# Patient Record
Sex: Female | Born: 1970 | Race: Black or African American | Hispanic: No | Smoking: Never smoker
Health system: Southern US, Community
[De-identification: ages and names within clinical notes are randomized; demographics above are authoritative.]

## PROBLEM LIST (undated history)

## (undated) DIAGNOSIS — D649 Anemia, unspecified: Secondary | ICD-10-CM

## (undated) DIAGNOSIS — H409 Unspecified glaucoma: Secondary | ICD-10-CM

## (undated) DIAGNOSIS — F32A Depression, unspecified: Secondary | ICD-10-CM

## (undated) DIAGNOSIS — F419 Anxiety disorder, unspecified: Secondary | ICD-10-CM

## (undated) DIAGNOSIS — L509 Urticaria, unspecified: Secondary | ICD-10-CM

## (undated) DIAGNOSIS — Z923 Personal history of irradiation: Secondary | ICD-10-CM

---

## 2003-12-12 ENCOUNTER — Emergency Department (HOSPITAL_COMMUNITY): Admission: EM | Admit: 2003-12-12 | Discharge: 2003-12-12 | Payer: Self-pay | Admitting: Emergency Medicine

## 2008-07-09 ENCOUNTER — Emergency Department (HOSPITAL_COMMUNITY): Admission: EM | Admit: 2008-07-09 | Discharge: 2008-07-09 | Payer: Self-pay | Admitting: Family Medicine

## 2009-03-05 ENCOUNTER — Emergency Department (HOSPITAL_COMMUNITY): Admission: EM | Admit: 2009-03-05 | Discharge: 2009-03-05 | Payer: Self-pay | Admitting: Emergency Medicine

## 2016-01-11 DIAGNOSIS — H40013 Open angle with borderline findings, low risk, bilateral: Secondary | ICD-10-CM | POA: Insufficient documentation

## 2016-01-11 DIAGNOSIS — H2513 Age-related nuclear cataract, bilateral: Secondary | ICD-10-CM | POA: Insufficient documentation

## 2016-01-11 DIAGNOSIS — L309 Dermatitis, unspecified: Secondary | ICD-10-CM | POA: Insufficient documentation

## 2016-01-11 DIAGNOSIS — L501 Idiopathic urticaria: Secondary | ICD-10-CM | POA: Insufficient documentation

## 2020-07-20 ENCOUNTER — Other Ambulatory Visit: Payer: Self-pay

## 2020-07-20 ENCOUNTER — Ambulatory Visit (HOSPITAL_COMMUNITY)
Admission: EM | Admit: 2020-07-20 | Discharge: 2020-07-21 | Disposition: A | Discharge status: Home / Self Care | Payer: 59 | Attending: Behavioral Health | Admitting: Behavioral Health

## 2020-07-20 ENCOUNTER — Ambulatory Visit (HOSPITAL_COMMUNITY)
Admission: AD | Admit: 2020-07-20 | Discharge: 2020-07-20 | Disposition: A | Discharge status: Home / Self Care | Payer: 59 | Source: Home / Self Care | Attending: Psychiatry | Admitting: Psychiatry

## 2020-07-20 DIAGNOSIS — Z20822 Contact with and (suspected) exposure to covid-19: Secondary | ICD-10-CM | POA: Insufficient documentation

## 2020-07-20 DIAGNOSIS — F4310 Post-traumatic stress disorder, unspecified: Secondary | ICD-10-CM | POA: Insufficient documentation

## 2020-07-20 DIAGNOSIS — F411 Generalized anxiety disorder: Secondary | ICD-10-CM | POA: Diagnosis not present

## 2020-07-20 DIAGNOSIS — F332 Major depressive disorder, recurrent severe without psychotic features: Secondary | ICD-10-CM | POA: Insufficient documentation

## 2020-07-20 DIAGNOSIS — R45851 Suicidal ideations: Secondary | ICD-10-CM | POA: Insufficient documentation

## 2020-07-20 DIAGNOSIS — F419 Anxiety disorder, unspecified: Secondary | ICD-10-CM | POA: Insufficient documentation

## 2020-07-20 DIAGNOSIS — Z635 Disruption of family by separation and divorce: Secondary | ICD-10-CM | POA: Insufficient documentation

## 2020-07-20 LAB — POCT URINE DRUG SCREEN - MANUAL ENTRY (I-SCREEN)
POC Amphetamine UR: NOT DETECTED
POC Buprenorphine (BUP): NOT DETECTED
POC Marijuana UR: NOT DETECTED
POC Methamphetamine UR: NOT DETECTED
POC Morphine: NOT DETECTED
POC Secobarbital (BAR): NOT DETECTED

## 2020-07-20 LAB — POCT URINE DRUG SCREEN - MANUAL ENTRY (I-CUP)
POC Cocaine UR: NOT DETECTED
POC Methadone UR: NOT DETECTED
POC Oxazepam (BZO): NOT DETECTED
POC Oxycodone UR: NOT DETECTED

## 2020-07-20 LAB — POCT PREGNANCY, URINE: Preg Test, Ur: NEGATIVE

## 2020-07-20 LAB — POC SARS CORONAVIRUS 2 AG: SARS Coronavirus 2 Ag: NEGATIVE

## 2020-07-20 LAB — POC SARS CORONAVIRUS 2 AG -  ED: SARS Coronavirus 2 Ag: NEGATIVE

## 2020-07-20 MED ORDER — LORAZEPAM 1 MG PO TABS
1.0000 mg | ORAL_TABLET | Freq: Once | ORAL | Status: AC
  Administered 2020-07-20: 1 mg via ORAL
  Filled 2020-07-20: qty 1

## 2020-07-20 MED ORDER — BUSPIRONE HCL 5 MG PO TABS
10.0000 mg | ORAL_TABLET | Freq: Three times a day (TID) | ORAL | Status: DC
  Administered 2020-07-20 – 2020-07-21 (×2): 10 mg via ORAL
  Filled 2020-07-20 (×2): qty 2
  Filled 2020-07-20: qty 1

## 2020-07-20 MED ORDER — ALUM & MAG HYDROXIDE-SIMETH 200-200-20 MG/5ML PO SUSP
30.0000 mL | ORAL | Status: DC | PRN
Start: 2020-07-20 — End: 2020-07-21

## 2020-07-20 MED ORDER — TRAZODONE HCL 50 MG PO TABS: 50.0000 mg | ORAL_TABLET | Freq: Every evening | ORAL | Status: DC | PRN

## 2020-07-20 MED ORDER — CITALOPRAM HYDROBROMIDE 10 MG PO TABS
10.0000 mg | ORAL_TABLET | Freq: Every day | ORAL | Status: DC
  Administered 2020-07-20: 10 mg via ORAL
  Filled 2020-07-20: qty 1

## 2020-07-20 MED ORDER — MAGNESIUM HYDROXIDE 400 MG/5ML PO SUSP: 30.0000 mL | Freq: Every day | ORAL | Status: DC | PRN

## 2020-07-20 MED ORDER — ACETAMINOPHEN 325 MG PO TABS: 650.0000 mg | ORAL_TABLET | Freq: Four times a day (QID) | ORAL | Status: DC | PRN
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
## 2020-07-20 NOTE — ED Notes (Signed)
Patient has no belongings in storage.

## 2020-07-21 ENCOUNTER — Encounter (HOSPITAL_COMMUNITY): Payer: Self-pay | Admitting: Emergency Medicine

## 2020-07-21 ENCOUNTER — Other Ambulatory Visit: Payer: Self-pay

## 2020-07-21 LAB — CBC WITH DIFFERENTIAL/PLATELET
Abs Immature Granulocytes: 0.02 10*3/uL (ref 0.00–0.07)
Basophils Absolute: 0.1 10*3/uL (ref 0.0–0.1)
Basophils Relative: 1 %
Eosinophils Absolute: 0 10*3/uL (ref 0.0–0.5)
Eosinophils Relative: 0 %
HCT: 36 % (ref 36.0–46.0)
Hemoglobin: 10.3 g/dL — ABNORMAL LOW (ref 12.0–15.0)
Immature Granulocytes: 0 %
Lymphocytes Relative: 28 %
Lymphs Abs: 2.2 10*3/uL (ref 0.7–4.0)
MCH: 22.1 pg — ABNORMAL LOW (ref 26.0–34.0)
MCHC: 28.6 g/dL — ABNORMAL LOW (ref 30.0–36.0)
MCV: 77.3 fL — ABNORMAL LOW (ref 80.0–100.0)
Monocytes Absolute: 0.6 10*3/uL (ref 0.1–1.0)
Monocytes Relative: 7 %
Neutro Abs: 5.1 10*3/uL (ref 1.7–7.7)
Neutrophils Relative %: 64 %
Platelets: 517 10*3/uL — ABNORMAL HIGH (ref 150–400)
RBC: 4.66 MIL/uL (ref 3.87–5.11)
RDW: 21.4 % — ABNORMAL HIGH (ref 11.5–15.5)
WBC: 8 10*3/uL (ref 4.0–10.5)
nRBC: 0 % (ref 0.0–0.2)

## 2020-07-21 LAB — ETHANOL: Alcohol, Ethyl (B): <10 mg/dL (ref <10)

## 2020-07-21 LAB — COMPREHENSIVE METABOLIC PANEL
ALT: 10 U/L (ref 0–44)
AST: 14 U/L — ABNORMAL LOW (ref 15–41)
Albumin: 3.8 g/dL (ref 3.5–5.0)
Alkaline Phosphatase: 46 U/L (ref 38–126)
Anion gap: 14 (ref 5–15)
BUN: 13 mg/dL (ref 6–20)
CO2: 24 mmol/L (ref 22–32)
Calcium: 9.7 mg/dL (ref 8.9–10.3)
Chloride: 102 mmol/L (ref 98–111)
Creatinine, Ser: 0.73 mg/dL (ref 0.44–1.00)
GFR calc Af Amer: >60 mL/min (ref >60)
GFR calc non Af Amer: >60 mL/min (ref >60)
Glucose, Bld: 103 mg/dL — ABNORMAL HIGH (ref 70–99)
Potassium: 2.9 mmol/L — ABNORMAL LOW (ref 3.5–5.1)
Sodium: 140 mmol/L (ref 135–145)
Total Bilirubin: 0.7 mg/dL (ref 0.3–1.2)
Total Protein: 8.3 g/dL — ABNORMAL HIGH (ref 6.5–8.1)

## 2020-07-21 LAB — SARS CORONAVIRUS 2 BY RT PCR (HOSPITAL ORDER, PERFORMED IN ~~LOC~~ HOSPITAL LAB): SARS Coronavirus 2: NEGATIVE

## 2020-07-21 LAB — HEMOGLOBIN A1C
Hgb A1c MFr Bld: 5.6 % (ref 4.8–5.6)
Mean Plasma Glucose: 114.02 mg/dL

## 2020-07-21 LAB — LIPID PANEL
Cholesterol: 193 mg/dL (ref 0–200)
HDL: 52 mg/dL (ref >40)
LDL Cholesterol: 129 mg/dL — ABNORMAL HIGH (ref 0–99)
Total CHOL/HDL Ratio: 3.7 RATIO
Triglycerides: 60 mg/dL (ref <150)
VLDL: 12 mg/dL (ref 0–40)

## 2020-07-21 LAB — TSH: TSH: 0.582 u[IU]/mL (ref 0.350–4.500)

## 2020-07-21 LAB — MAGNESIUM: Magnesium: 2.1 mg/dL (ref 1.7–2.4)

## 2020-07-21 MED ORDER — BUSPIRONE HCL 10 MG PO TABS
10.0000 mg | ORAL_TABLET | Freq: Three times a day (TID) | ORAL | 0 refills | Status: DC
Start: 2020-07-21 — End: 2020-08-08

## 2020-07-21 MED ORDER — POTASSIUM CHLORIDE CRYS ER 20 MEQ PO TBCR
40.0000 meq | EXTENDED_RELEASE_TABLET | Freq: Once | ORAL | Status: AC
  Administered 2020-07-21: 40 meq via ORAL
  Filled 2020-07-21: qty 2

## 2020-07-21 MED ORDER — CITALOPRAM HYDROBROMIDE 10 MG PO TABS
10.0000 mg | ORAL_TABLET | Freq: Every day | ORAL | 0 refills | Status: DC
Start: 2020-07-21 — End: 2020-08-08
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
## 2020-07-21 NOTE — ED Notes (Signed)
Pt sleeping at present, no distress noted, monitoring for safety. 

## 2020-07-21 NOTE — ED Notes (Signed)
Pt sitting up in chair wrapped in blanket. Watching tv. No new issues noted at this time

## 2020-07-21 NOTE — Discharge Instructions (Addendum)

## 2020-07-21 NOTE — ED Notes (Addendum)
Pt alert and oriented on the unit. Pt is very teary eyed and anxious when engaged with RN. Pt is sitting in her chair/bed watching television. Education, support and encouragement provided. Medications administered per Provider's orders. No reactions/side effects to medicine noted. Pt denies any concerns at this time, and verbally contracts for safety. Pt ambulating on the unit with no issues. Pt remains safe on the unit.

## 2020-07-21 NOTE — ED Triage Notes (Signed)
Presents with SI and anxiety increasing in severity over past 24 hrs.

## 2020-07-21 NOTE — ED Notes (Signed)
Pt alert and oriented on the unit. Education, support, and encouragement provided. Discharge summary, medications and follow up appointments reviewed with pt. Suicide prevention resources provided. Pt had no belongings in locker. Pt denies SI/HI, A/VH, pain, or any concerns at this time. Pt ambulatory on and off unit. Pt discharged to lobby.
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
## 2020-07-22 LAB — PROLACTIN: Prolactin: 14.1 ng/mL (ref 4.8–23.3)

## 2020-08-01 ENCOUNTER — Other Ambulatory Visit: Payer: Self-pay

## 2020-08-01 ENCOUNTER — Encounter (HOSPITAL_COMMUNITY): Payer: Self-pay | Admitting: Psychiatry

## 2020-08-01 ENCOUNTER — Ambulatory Visit (INDEPENDENT_AMBULATORY_CARE_PROVIDER_SITE_OTHER): Payer: 59 | Admitting: Psychiatry

## 2020-08-01 DIAGNOSIS — F4310 Post-traumatic stress disorder, unspecified: Secondary | ICD-10-CM

## 2020-08-01 DIAGNOSIS — F331 Major depressive disorder, recurrent, moderate: Secondary | ICD-10-CM | POA: Diagnosis not present
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
## 2020-08-08 ENCOUNTER — Other Ambulatory Visit: Payer: Self-pay

## 2020-08-08 ENCOUNTER — Telehealth (INDEPENDENT_AMBULATORY_CARE_PROVIDER_SITE_OTHER): Payer: 59 | Admitting: Psychiatry

## 2020-08-08 ENCOUNTER — Encounter (HOSPITAL_COMMUNITY): Payer: Self-pay | Admitting: Psychiatry

## 2020-08-08 DIAGNOSIS — F4310 Post-traumatic stress disorder, unspecified: Secondary | ICD-10-CM | POA: Diagnosis not present

## 2020-08-08 DIAGNOSIS — F331 Major depressive disorder, recurrent, moderate: Secondary | ICD-10-CM

## 2020-08-08 MED ORDER — HYDROXYZINE HCL 25 MG PO TABS: 25.0000 mg | ORAL_TABLET | Freq: Every day | ORAL | 1 refills | Status: DC | PRN

## 2020-08-08 MED ORDER — CITALOPRAM HYDROBROMIDE 20 MG PO TABS
20.0000 mg | ORAL_TABLET | Freq: Every day | ORAL | 1 refills | Status: DC
Start: 2020-08-08 — End: 2020-09-12

## 2020-08-08 NOTE — Patient Instructions (Addendum)
1. Increase citalopram 20 mg daily  2. Discontinue buspar  3. Start hydroxyzine 25 mg daily as needed for anxiety  4. Next appointment- 10/21 at 9 AM  CONTACT INFORMATION  What to do if you need to get in touch with someone regarding a psychiatric issue:  1. EMERGENCY: For psychiatric emergencies (if you are suicidal or if there are any other safety issues) call 911 and/or go to your nearest Emergency Room immediately.   2. IF YOU NEED SOMEONE TO TALK TO RIGHT NOW: Given my clinical responsibilities, I may not be able to speak with you over the phone for a prolonged period of time.  A. You may always call The National Suicide Prevention Lifeline at 1-800-273-TALK [phone]).  B. You may walk in to [facility]  Address: [address] [place], [place] [address], Phone: [phone].  Open 24/7, No appointment required. [place] of residence will also have local crisis services. For [facility]: [place] at [phone] ([place])

## 2020-08-14 HISTORY — PX: HYSTEROSCOPY W/ ENDOMETRIAL ABLATION: SUR665

## 2020-08-20 ENCOUNTER — Ambulatory Visit (INDEPENDENT_AMBULATORY_CARE_PROVIDER_SITE_OTHER): Payer: Managed Care, Other (non HMO) | Admitting: Psychiatry

## 2020-08-20 ENCOUNTER — Other Ambulatory Visit: Payer: Self-pay

## 2020-08-20 DIAGNOSIS — F4310 Post-traumatic stress disorder, unspecified: Secondary | ICD-10-CM | POA: Diagnosis not present

## 2020-08-20 DIAGNOSIS — F331 Major depressive disorder, recurrent, moderate: Secondary | ICD-10-CM | POA: Diagnosis not present
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
## 2020-09-03 ENCOUNTER — Ambulatory Visit (INDEPENDENT_AMBULATORY_CARE_PROVIDER_SITE_OTHER): Payer: 59 | Admitting: Psychiatry

## 2020-09-03 ENCOUNTER — Other Ambulatory Visit: Payer: Self-pay

## 2020-09-03 DIAGNOSIS — F331 Major depressive disorder, recurrent, moderate: Secondary | ICD-10-CM
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
## 2020-09-12 ENCOUNTER — Encounter (HOSPITAL_COMMUNITY): Payer: Self-pay | Admitting: Psychiatry

## 2020-09-12 ENCOUNTER — Telehealth (INDEPENDENT_AMBULATORY_CARE_PROVIDER_SITE_OTHER): Payer: 59 | Admitting: Psychiatry

## 2020-09-12 ENCOUNTER — Other Ambulatory Visit: Payer: Self-pay

## 2020-09-12 DIAGNOSIS — F4310 Post-traumatic stress disorder, unspecified: Secondary | ICD-10-CM

## 2020-09-12 DIAGNOSIS — F331 Major depressive disorder, recurrent, moderate: Secondary | ICD-10-CM

## 2020-09-12 MED ORDER — CITALOPRAM HYDROBROMIDE 40 MG PO TABS: 40.0000 mg | ORAL_TABLET | Freq: Every day | ORAL | 1 refills | Status: DC

## 2020-09-12 MED ORDER — HYDROXYZINE HCL 25 MG PO TABS: 25.0000 mg | ORAL_TABLET | Freq: Every day | ORAL | 0 refills | Status: DC | PRN

## 2020-09-12 NOTE — Patient Instructions (Signed)
1. Increase citalopram 40 mg daily  2. Continue hydroxyzine 25 mg daily as needed for anxiety  4. Next appointment- 12/9 at 2:30  CONTACT INFORMATION  What to do if you need to get in touch with someone regarding a psychiatric issue:  1. EMERGENCY: For psychiatric emergencies (if you are suicidal or if there are any other safety issues) call 911 and/or go to your nearest Emergency Room immediately.   2. IF YOU NEED SOMEONE TO TALK TO RIGHT NOW: Given my clinical responsibilities, I may not be able to speak with you over the phone for a prolonged period of time.  A. You may always call The National Suicide Prevention Lifeline at 1-800-273-TALK 629-199-9664).  B. You may walk in to [facility]  Address: [address] [place], [place] [address], Phone: [phone].  Open 24/7, No appointment required. [place] of residence will also have local crisis services. For [facility]: [place] at [phone] ([place])

## 2020-09-17 ENCOUNTER — Ambulatory Visit (HOSPITAL_COMMUNITY): Payer: 59 | Admitting: Psychiatry

## 2020-10-01 ENCOUNTER — Telehealth: Payer: Self-pay

## 2020-10-01 ENCOUNTER — Other Ambulatory Visit: Payer: Self-pay

## 2020-10-01 ENCOUNTER — Ambulatory Visit (INDEPENDENT_AMBULATORY_CARE_PROVIDER_SITE_OTHER): Payer: 59 | Admitting: Psychiatry

## 2020-10-01 DIAGNOSIS — F4310 Post-traumatic stress disorder, unspecified: Secondary | ICD-10-CM

## 2020-10-01 DIAGNOSIS — F331 Major depressive disorder, recurrent, moderate: Secondary | ICD-10-CM | POA: Diagnosis not present

## 2020-10-01 NOTE — Telephone Encounter (Signed)
received a fax requesting a 90 day supply of the hydroxyzine. pt next appt 10-31-20   hydrOXYzine (ATARAX/VISTARIL) 25 MG tablet Medication Date: 09/12/2020 Department: [facility] Ordering/Authorizing: [name], MD  Order Providers  Prescribing Provider Encounter Provider  [name], MD [name], MD  Outpatient Medication Detail   Disp Refills Start End   hydrOXYzine (ATARAX/VISTARIL) 25 MG tablet 30 tablet 0 10/06/2020    Sig - Route: Take 1 tablet (25 mg total) by mouth daily as needed for anxiety. - Oral   Sent to pharmacy as: hydrOXYzine (ATARAX/VISTARIL) 25 MG tablet   E-Prescribing Status: Receipt confirmed by pharmacy (09/12/2020 9:21 AM EDT)   Pharmacy  [id number] - [place], [place]

## 2020-10-01 NOTE — Telephone Encounter (Signed)
Decline as we may change the dose.
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
## 2020-10-23 ENCOUNTER — Ambulatory Visit (INDEPENDENT_AMBULATORY_CARE_PROVIDER_SITE_OTHER): Payer: 59 | Admitting: Psychiatry

## 2020-10-23 ENCOUNTER — Other Ambulatory Visit: Payer: Self-pay

## 2020-10-23 DIAGNOSIS — F331 Major depressive disorder, recurrent, moderate: Secondary | ICD-10-CM

## 2020-10-23 DIAGNOSIS — F4310 Post-traumatic stress disorder, unspecified: Secondary | ICD-10-CM | POA: Diagnosis not present
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
## 2020-10-31 ENCOUNTER — Encounter: Payer: Self-pay | Admitting: Psychiatry

## 2020-10-31 ENCOUNTER — Telehealth (INDEPENDENT_AMBULATORY_CARE_PROVIDER_SITE_OTHER): Payer: 59 | Admitting: Psychiatry

## 2020-10-31 ENCOUNTER — Telehealth (HOSPITAL_COMMUNITY): Payer: 59 | Admitting: Psychiatry

## 2020-10-31 ENCOUNTER — Other Ambulatory Visit: Payer: Self-pay

## 2020-10-31 DIAGNOSIS — F331 Major depressive disorder, recurrent, moderate: Secondary | ICD-10-CM | POA: Diagnosis not present

## 2020-10-31 DIAGNOSIS — F4310 Post-traumatic stress disorder, unspecified: Secondary | ICD-10-CM | POA: Diagnosis not present

## 2020-10-31 MED ORDER — HYDROXYZINE HCL 25 MG PO TABS: 25.0000 mg | ORAL_TABLET | Freq: Every day | ORAL | 1 refills | Status: DC | PRN

## 2020-10-31 MED ORDER — SERTRALINE HCL 50 MG PO TABS: ORAL_TABLET | ORAL | 1 refills | Status: DC

## 2020-11-06 ENCOUNTER — Ambulatory Visit (HOSPITAL_COMMUNITY): Payer: 59 | Admitting: Psychiatry

## 2020-11-07 ENCOUNTER — Telehealth: Payer: Self-pay

## 2020-11-07 NOTE — Telephone Encounter (Signed)
Decline as this medication is supposed to be discontinued

## 2020-11-07 NOTE — Telephone Encounter (Signed)
Medication refill request - Fax received from [facility] for a 90 day refill of patient's prescribed Citalopram, last ordered 09/12/20 + 1 refill.  Pt returns 12/12/20 and last seen 10/31/20.

## 2020-11-11 ENCOUNTER — Ambulatory Visit (INDEPENDENT_AMBULATORY_CARE_PROVIDER_SITE_OTHER): Payer: 59 | Admitting: Psychiatry

## 2020-11-11 ENCOUNTER — Other Ambulatory Visit: Payer: Self-pay

## 2020-11-11 DIAGNOSIS — F331 Major depressive disorder, recurrent, moderate: Secondary | ICD-10-CM

## 2020-11-11 DIAGNOSIS — F4310 Post-traumatic stress disorder, unspecified: Secondary | ICD-10-CM | POA: Diagnosis not present
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
## 2020-11-20 ENCOUNTER — Ambulatory Visit (HOSPITAL_COMMUNITY): Payer: 59 | Admitting: Psychiatry

## 2020-12-02 ENCOUNTER — Ambulatory Visit (HOSPITAL_COMMUNITY): Payer: 59 | Admitting: Psychiatry

## 2020-12-12 ENCOUNTER — Telehealth: Payer: 59 | Admitting: Psychiatry

## 2020-12-16 ENCOUNTER — Other Ambulatory Visit: Payer: Self-pay

## 2020-12-16 ENCOUNTER — Ambulatory Visit (INDEPENDENT_AMBULATORY_CARE_PROVIDER_SITE_OTHER): Payer: 59 | Admitting: Psychiatry

## 2020-12-16 DIAGNOSIS — F331 Major depressive disorder, recurrent, moderate: Secondary | ICD-10-CM

## 2020-12-16 DIAGNOSIS — F4310 Post-traumatic stress disorder, unspecified: Secondary | ICD-10-CM
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
## 2021-01-02 ENCOUNTER — Other Ambulatory Visit: Payer: Self-pay

## 2021-01-02 ENCOUNTER — Ambulatory Visit (INDEPENDENT_AMBULATORY_CARE_PROVIDER_SITE_OTHER): Payer: 59 | Admitting: Psychiatry

## 2021-01-02 DIAGNOSIS — F331 Major depressive disorder, recurrent, moderate: Secondary | ICD-10-CM

## 2021-01-02 DIAGNOSIS — F4310 Post-traumatic stress disorder, unspecified: Secondary | ICD-10-CM | POA: Diagnosis not present
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
## 2021-01-03 ENCOUNTER — Encounter: Payer: Self-pay | Admitting: Psychiatry

## 2021-01-03 ENCOUNTER — Telehealth (INDEPENDENT_AMBULATORY_CARE_PROVIDER_SITE_OTHER): Payer: 59 | Admitting: Psychiatry

## 2021-01-03 ENCOUNTER — Other Ambulatory Visit: Payer: Self-pay

## 2021-01-03 DIAGNOSIS — F330 Major depressive disorder, recurrent, mild: Secondary | ICD-10-CM | POA: Diagnosis not present

## 2021-01-03 DIAGNOSIS — F4310 Post-traumatic stress disorder, unspecified: Secondary | ICD-10-CM

## 2021-01-03 MED ORDER — SERTRALINE HCL 100 MG PO TABS: 100.0000 mg | ORAL_TABLET | Freq: Every day | ORAL | 0 refills | Status: DC

## 2021-01-03 MED ORDER — HYDROXYZINE HCL 25 MG PO TABS: 25.0000 mg | ORAL_TABLET | Freq: Every day | ORAL | 0 refills | Status: DC | PRN

## 2021-01-16 ENCOUNTER — Ambulatory Visit (HOSPITAL_COMMUNITY): Payer: 59 | Admitting: Psychiatry
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
## 2021-01-21 ENCOUNTER — Encounter (HOSPITAL_BASED_OUTPATIENT_CLINIC_OR_DEPARTMENT_OTHER): Payer: Self-pay | Admitting: General Surgery

## 2021-01-22 ENCOUNTER — Other Ambulatory Visit: Payer: Self-pay

## 2021-01-22 ENCOUNTER — Encounter (HOSPITAL_BASED_OUTPATIENT_CLINIC_OR_DEPARTMENT_OTHER): Payer: Self-pay | Admitting: General Surgery

## 2021-01-23 ENCOUNTER — Other Ambulatory Visit: Payer: Self-pay | Admitting: General Surgery

## 2021-01-24 ENCOUNTER — Other Ambulatory Visit (HOSPITAL_COMMUNITY)
Admission: RE | Admit: 2021-01-24 | Discharge: 2021-01-24 | Disposition: A | Discharge status: Home / Self Care | Payer: 59 | Source: Ambulatory Visit | Attending: General Surgery | Admitting: General Surgery

## 2021-01-24 DIAGNOSIS — Z20822 Contact with and (suspected) exposure to covid-19: Secondary | ICD-10-CM | POA: Diagnosis not present

## 2021-01-24 DIAGNOSIS — Z01812 Encounter for preprocedural laboratory examination: Secondary | ICD-10-CM | POA: Insufficient documentation

## 2021-01-24 LAB — SARS CORONAVIRUS 2 (TAT 6-24 HRS): SARS Coronavirus 2: NEGATIVE

## 2021-01-24 MED ORDER — ENSURE PRE-SURGERY PO LIQD: 296.0000 mL | Freq: Once | ORAL | Status: DC

## 2021-01-24 NOTE — Progress Notes (Signed)

## 2021-01-28 ENCOUNTER — Other Ambulatory Visit: Payer: Self-pay

## 2021-01-28 ENCOUNTER — Encounter (HOSPITAL_BASED_OUTPATIENT_CLINIC_OR_DEPARTMENT_OTHER)
Admission: RE | Disposition: A | Discharge status: Home / Self Care | Payer: Self-pay | Source: Ambulatory Visit | Attending: General Surgery

## 2021-01-28 ENCOUNTER — Ambulatory Visit (HOSPITAL_BASED_OUTPATIENT_CLINIC_OR_DEPARTMENT_OTHER)
Admission: RE | Admit: 2021-01-28 | Discharge: 2021-01-28 | Disposition: A | Discharge status: Home / Self Care | Payer: 59 | Source: Ambulatory Visit | Attending: General Surgery | Admitting: General Surgery

## 2021-01-28 ENCOUNTER — Ambulatory Visit (HOSPITAL_BASED_OUTPATIENT_CLINIC_OR_DEPARTMENT_OTHER): Payer: 59 | Admitting: Anesthesiology

## 2021-01-28 ENCOUNTER — Encounter (HOSPITAL_BASED_OUTPATIENT_CLINIC_OR_DEPARTMENT_OTHER): Payer: Self-pay | Admitting: General Surgery

## 2021-01-28 DIAGNOSIS — Z887 Allergy status to serum and vaccine status: Secondary | ICD-10-CM | POA: Diagnosis not present

## 2021-01-28 DIAGNOSIS — N6002 Solitary cyst of left breast: Secondary | ICD-10-CM | POA: Diagnosis not present

## 2021-01-28 DIAGNOSIS — Z880 Allergy status to penicillin: Secondary | ICD-10-CM | POA: Insufficient documentation

## 2021-01-28 HISTORY — DX: Unspecified glaucoma: H40.9

## 2021-01-28 HISTORY — PX: BREAST CYST EXCISION: SHX579

## 2021-01-28 HISTORY — DX: Depression, unspecified: F32.A

## 2021-01-28 HISTORY — DX: Anxiety disorder, unspecified: F41.9

## 2021-01-28 HISTORY — DX: Anemia, unspecified: D64.9

## 2021-01-28 LAB — POCT PREGNANCY, URINE: Preg Test, Ur: NEGATIVE

## 2021-01-28 SURGERY — EXCISION, CYST, BREAST
Anesthesia: General | Site: Breast | Laterality: Left

## 2021-01-28 MED ORDER — CEFAZOLIN SODIUM-DEXTROSE 2-4 GM/100ML-% IV SOLN
INTRAVENOUS | Status: AC
  Filled 2021-01-28: qty 100

## 2021-01-28 MED ORDER — CELECOXIB 200 MG PO CAPS
400.0000 mg | ORAL_CAPSULE | ORAL | Status: AC
  Administered 2021-01-28: 400 mg via ORAL

## 2021-01-28 MED ORDER — BUPIVACAINE HCL (PF) 0.25 % IJ SOLN
INTRAMUSCULAR | Status: AC
  Filled 2021-01-28: qty 30

## 2021-01-28 MED ORDER — CHLORHEXIDINE GLUCONATE CLOTH 2 % EX PADS: 6.0000 | MEDICATED_PAD | Freq: Once | CUTANEOUS | Status: DC

## 2021-01-28 MED ORDER — LIDOCAINE HCL (CARDIAC) PF 100 MG/5ML IV SOSY
PREFILLED_SYRINGE | INTRAVENOUS | Status: DC | PRN
  Administered 2021-01-28: 60 mg via INTRAVENOUS

## 2021-01-28 MED ORDER — ACETAMINOPHEN 500 MG PO TABS
ORAL_TABLET | ORAL | Status: AC
  Filled 2021-01-28: qty 2

## 2021-01-28 MED ORDER — CELECOXIB 200 MG PO CAPS
ORAL_CAPSULE | ORAL | Status: AC
  Filled 2021-01-28: qty 2

## 2021-01-28 MED ORDER — ONDANSETRON HCL 4 MG/2ML IJ SOLN
INTRAMUSCULAR | Status: DC | PRN
  Administered 2021-01-28: 4 mg via INTRAVENOUS

## 2021-01-28 MED ORDER — LACTATED RINGERS IV SOLN: INTRAVENOUS | Status: DC

## 2021-01-28 MED ORDER — BUPIVACAINE HCL (PF) 0.25 % IJ SOLN
INTRAMUSCULAR | Status: DC | PRN
  Administered 2021-01-28: 10 mL

## 2021-01-28 MED ORDER — PROPOFOL 10 MG/ML IV BOLUS
INTRAVENOUS | Status: AC
  Filled 2021-01-28: qty 20

## 2021-01-28 MED ORDER — FENTANYL CITRATE (PF) 100 MCG/2ML IJ SOLN
INTRAMUSCULAR | Status: AC
  Filled 2021-01-28: qty 2

## 2021-01-28 MED ORDER — DEXAMETHASONE SODIUM PHOSPHATE 4 MG/ML IJ SOLN
INTRAMUSCULAR | Status: DC | PRN
  Administered 2021-01-28: 4 mg via INTRAVENOUS

## 2021-01-28 MED ORDER — PROPOFOL 10 MG/ML IV BOLUS
INTRAVENOUS | Status: DC | PRN
  Administered 2021-01-28: 150 mg via INTRAVENOUS

## 2021-01-28 MED ORDER — EPHEDRINE SULFATE 50 MG/ML IJ SOLN
INTRAMUSCULAR | Status: DC | PRN
  Administered 2021-01-28 (×2): 10 mg via INTRAVENOUS

## 2021-01-28 MED ORDER — OXYCODONE HCL 5 MG PO TABS
5.0000 mg | ORAL_TABLET | Freq: Once | ORAL | Status: AC | PRN
Start: 2021-01-28 — End: 2021-01-28
  Administered 2021-01-28: 5 mg via ORAL

## 2021-01-28 MED ORDER — CEFAZOLIN SODIUM-DEXTROSE 2-4 GM/100ML-% IV SOLN
2.0000 g | INTRAVENOUS | Status: AC
  Administered 2021-01-28: 2 g via INTRAVENOUS

## 2021-01-28 MED ORDER — LIDOCAINE 2% (20 MG/ML) 5 ML SYRINGE
INTRAMUSCULAR | Status: AC
  Filled 2021-01-28: qty 5

## 2021-01-28 MED ORDER — FENTANYL CITRATE (PF) 100 MCG/2ML IJ SOLN
INTRAMUSCULAR | Status: DC | PRN
  Administered 2021-01-28: 50 ug via INTRAVENOUS
  Administered 2021-01-28 (×2): 25 ug via INTRAVENOUS

## 2021-01-28 MED ORDER — PHENYLEPHRINE HCL (PRESSORS) 10 MG/ML IV SOLN
INTRAVENOUS | Status: DC | PRN
  Administered 2021-01-28 (×2): 80 ug via INTRAVENOUS

## 2021-01-28 MED ORDER — FENTANYL CITRATE (PF) 100 MCG/2ML IJ SOLN
25.0000 ug | INTRAMUSCULAR | Status: DC | PRN
  Administered 2021-01-28: 25 ug via INTRAVENOUS
  Administered 2021-01-28: 50 ug via INTRAVENOUS

## 2021-01-28 MED ORDER — OXYCODONE HCL 5 MG PO TABS
ORAL_TABLET | ORAL | Status: AC
  Filled 2021-01-28: qty 1

## 2021-01-28 MED ORDER — MIDAZOLAM HCL 2 MG/2ML IJ SOLN
INTRAMUSCULAR | Status: AC
  Filled 2021-01-28: qty 2

## 2021-01-28 MED ORDER — ACETAMINOPHEN 500 MG PO TABS
1000.0000 mg | ORAL_TABLET | ORAL | Status: AC
  Administered 2021-01-28: 1000 mg via ORAL

## 2021-01-28 MED ORDER — MIDAZOLAM HCL 5 MG/5ML IJ SOLN
INTRAMUSCULAR | Status: DC | PRN
  Administered 2021-01-28: 2 mg via INTRAVENOUS

## 2021-01-28 SURGICAL SUPPLY — 51 items
ADH SKN CLS APL DERMABOND .7 (GAUZE/BANDAGES/DRESSINGS)
APL PRP STRL LF DISP 70% ISPRP (MISCELLANEOUS) ×1
BINDER BREAST LRG (GAUZE/BANDAGES/DRESSINGS) IMPLANT
BINDER BREAST MEDIUM (GAUZE/BANDAGES/DRESSINGS) IMPLANT
BINDER BREAST XLRG (GAUZE/BANDAGES/DRESSINGS) ×2 IMPLANT
BINDER BREAST XXLRG (GAUZE/BANDAGES/DRESSINGS) IMPLANT
BLADE SURG 15 STRL LF DISP TIS (BLADE) ×1 IMPLANT
BLADE SURG 15 STRL SS (BLADE) ×2
CANISTER SUCT 1200ML W/VALVE (MISCELLANEOUS) IMPLANT
CHLORAPREP W/TINT 26 (MISCELLANEOUS) ×2 IMPLANT
CLIP VESOCCLUDE SM WIDE 6/CT (CLIP) IMPLANT
COVER BACK TABLE 60X90IN (DRAPES) ×2 IMPLANT
COVER MAYO STAND STRL (DRAPES) ×2 IMPLANT
COVER WAND RF STERILE (DRAPES) IMPLANT
DECANTER SPIKE VIAL GLASS SM (MISCELLANEOUS) IMPLANT
DERMABOND ADVANCED (GAUZE/BANDAGES/DRESSINGS)
DERMABOND ADVANCED .7 DNX12 (GAUZE/BANDAGES/DRESSINGS) IMPLANT
DRAPE LAPAROSCOPIC ABDOMINAL (DRAPES) ×2 IMPLANT
DRAPE UTILITY XL STRL (DRAPES) ×2 IMPLANT
DRSG TEGADERM 4X4.75 (GAUZE/BANDAGES/DRESSINGS) ×2 IMPLANT
ELECT COATED BLADE 2.86 ST (ELECTRODE) ×2 IMPLANT
ELECT REM PT RETURN 9FT ADLT (ELECTROSURGICAL) ×2
ELECTRODE REM PT RTRN 9FT ADLT (ELECTROSURGICAL) ×1 IMPLANT
GAUZE SPONGE 4X4 12PLY STRL LF (GAUZE/BANDAGES/DRESSINGS) ×2 IMPLANT
GLOVE SURG ENC MOIS LTX SZ7 (GLOVE) ×2 IMPLANT
GLOVE SURG UNDER POLY LF SZ7.5 (GLOVE) ×2 IMPLANT
GOWN STRL REUS W/ TWL LRG LVL3 (GOWN DISPOSABLE) ×3 IMPLANT
GOWN STRL REUS W/TWL LRG LVL3 (GOWN DISPOSABLE) ×6
KIT MARKER MARGIN INK (KITS) ×2 IMPLANT
NEEDLE HYPO 25X1 1.5 SAFETY (NEEDLE) ×2 IMPLANT
NS IRRIG 1000ML POUR BTL (IV SOLUTION) IMPLANT
PACK BASIN DAY SURGERY FS (CUSTOM PROCEDURE TRAY) ×2 IMPLANT
PENCIL SMOKE EVACUATOR (MISCELLANEOUS) ×2 IMPLANT
RETRACTOR ONETRAX LX 90X20 (MISCELLANEOUS) IMPLANT
SLEEVE SCD COMPRESS KNEE MED (STOCKING) ×2 IMPLANT
SPONGE LAP 4X18 RFD (DISPOSABLE) ×4 IMPLANT
STRIP CLOSURE SKIN 1/2X4 (GAUZE/BANDAGES/DRESSINGS) ×2 IMPLANT
SUT MNCRL AB 4-0 PS2 18 (SUTURE) IMPLANT
SUT MON AB 5-0 PS2 18 (SUTURE) IMPLANT
SUT SILK 2 0 SH (SUTURE) ×2 IMPLANT
SUT VIC AB 2-0 SH 27 (SUTURE) ×4
SUT VIC AB 2-0 SH 27XBRD (SUTURE) ×2 IMPLANT
SUT VIC AB 3-0 SH 27 (SUTURE) ×2
SUT VIC AB 3-0 SH 27X BRD (SUTURE) ×1 IMPLANT
SUT VIC AB 5-0 PS2 18 (SUTURE) ×2 IMPLANT
SUT VICRYL AB 3 0 TIES (SUTURE) IMPLANT
SYR CONTROL 10ML LL (SYRINGE) ×2 IMPLANT
TOWEL GREEN STERILE FF (TOWEL DISPOSABLE) ×2 IMPLANT
TRAY FAXITRON CT DISP (TRAY / TRAY PROCEDURE) IMPLANT
TUBE CONNECTING 20X1/4 (TUBING) IMPLANT
YANKAUER SUCT BULB TIP NO VENT (SUCTIONS) IMPLANT

## 2021-01-28 NOTE — H&P (Signed)
Maria Diaz is an 50 y.o. female.   Chief Complaint: recurrent breast cyst HPI: 45 yof with prior history of breast cyst and aspiration presents with luoq and axillary pain. she has no dc. I do not have any of these records today and am unable to get them on care everywhere. there apparently was a left breast cyst aspirated in 2020 and then in Feb 2021. she notes pain where this might have been but no real mass. she hasnt had any more imaging since then. she has no bca family history. I now have her records. she has also had another mm/us since then. she continues to palpate this mass and have pain at the site. she thinks the cysts have been drained three times and recurred each time. her latest imaging shows c density breasts. there are several oval masses in the uoq of the left breast that measure 2.9 cm in total. there are numerous other cysts on both sides but only ones that bother her are the luoq. she is here to discuss options.   Past Medical History:  Diagnosis Date  . Anemia   . Anxiety   . Depression   . Glaucoma     Past Surgical History:  Procedure Laterality Date  . HYSTEROSCOPY W/ ENDOMETRIAL ABLATION  08/14/2020   [facility]    Family History  Problem Relation Age of Onset  . Anxiety disorder Sister   . Drug abuse Mother    Social History:  reports that she has never smoked. She has never used smokeless tobacco. She reports current alcohol use of about 1.0 standard drink of alcohol per week. She reports that she does not use drugs.  Allergies:  Allergies  Allergen Reactions  . Haemophilus Influenzae Vaccines Hives  . Penicillins Hives    No medications prior to admission.    No results found for this or any previous visit (from the past 48 hour(s)). No results found.  Review of Systems  All other systems reviewed and are negative.   Height 5\' 7"  (1.702 m), weight 74.8 kg, last menstrual period 06/23/2020. Physical Exam  General Mental  Status-Alert. Orientation-Oriented X3. Breast Nipples-No Discharge. Note: hard mass in luoq, mobile tender about 3-4 cm Lymphatic Head & Neck General Head & Neck Lymphatics: Bilateral - Description - Normal. Axillary General Axillary Region: Bilateral - Description - Normal. Note: no [place] adenopathy   Assessment/Plan BREAST CYST, LEFT (N60.02) Story: left breast mass excision I am concerned somewhat due to recurrence and the exam findings but they do just appear to be cysts on [place]. she is quite symptomatic at this site and I think reasonable to excise these at this point and send to path. more drainage is not really going to help. she would like to do that option also. discussed surgery and recovery. will do with US guidance.  Rolm Bookbinder, MD 01/28/2021, 10:02 AM

## 2021-01-28 NOTE — Anesthesia Preprocedure Evaluation (Addendum)
Anesthesia Evaluation  Patient identified by MRN, date of birth, ID band Patient awake    Reviewed: Allergy & Precautions, NPO status , Patient's Chart, lab work & pertinent test results  Airway Mallampati: II  TM Distance: >3 FB Neck ROM: Full    Dental no notable dental hx. (+) Teeth Intact, Dental Advisory Given   Pulmonary neg pulmonary ROS,    Pulmonary exam normal breath sounds clear to auscultation       Cardiovascular negative cardio ROS Normal cardiovascular exam Rhythm:Regular Rate:Normal     Neuro/Psych PSYCHIATRIC DISORDERS Anxiety Depression negative neurological ROS     GI/Hepatic negative GI ROS, Neg liver ROS,   Endo/Other  negative endocrine ROS  Renal/GU negative Renal ROS  negative genitourinary   Musculoskeletal negative musculoskeletal ROS (+)   Abdominal   Peds  Hematology negative hematology ROS (+)   Anesthesia Other Findings Left breast cyst  Reproductive/Obstetrics                            Anesthesia Physical Anesthesia Plan  ASA: II  Anesthesia Plan: General   Post-op Pain Management:    Induction: Intravenous  PONV Risk Score and Plan: 3 and Ondansetron, Dexamethasone and Midazolam  Airway Management Planned: LMA  Additional Equipment:   Intra-op Plan:   Post-operative Plan: Extubation in OR  Informed Consent: I have reviewed the patients History and Physical, chart, labs and discussed the procedure including the risks, benefits and alternatives for the proposed anesthesia with the patient or authorized representative who has indicated his/her understanding and acceptance.     Dental advisory given  Plan Discussed with: CRNA  Anesthesia Plan Comments:         Anesthesia Quick Evaluation

## 2021-01-28 NOTE — Transfer of Care (Signed)
Immediate Anesthesia Transfer of Care Note  Patient: Maria Diaz  Procedure(s) Performed: LEFT BREAST MASS EXCISION (Left Breast)  Patient Location: PACU  Anesthesia Type:General  Level of Consciousness: awake, alert  and oriented  Airway & Oxygen Therapy: Patient Spontanous Breathing and Patient connected to face mask oxygen  Post-op Assessment: Report given to RN and Post -op Vital signs reviewed and stable  Post vital signs: Reviewed and stable  Last Vitals:  Vitals Value Taken Time  BP 135/87 01/28/21 1511  Temp    Pulse 92 01/28/21 1513  Resp 15 01/28/21 1513  SpO2 100 % 01/28/21 1513  Vitals shown include unvalidated device data.  Last Pain:  Vitals:   01/28/21 1320  TempSrc: Oral  PainSc: 4       Patients Stated Pain Goal: 4 (67/01/41 0301)  Complications: No complications documented.

## 2021-01-28 NOTE — Interval H&P Note (Signed)
History and Physical Interval Note:  01/28/2021 1:13 PM  Maria Diaz  has presented today for surgery, with the diagnosis of LEFT BREAST RECURRENT INFLAMMATORY CYST.  The various methods of treatment have been discussed with the patient and family. After consideration of risks, benefits and other options for treatment, the patient has consented to  Procedure(s) with comments: LEFT BREAST MASS EXCISION (Left) - START TIME OF 3:00 PM FOR 60 MINUTES IN ROOM 8 as a surgical intervention.  The patient's history has been reviewed, patient examined, no change in status, stable for surgery.  I have reviewed the patient's chart and labs.  Questions were answered to the patient's satisfaction.     Rolm Bookbinder

## 2021-01-28 NOTE — Anesthesia Procedure Notes (Signed)
Procedure Name: LMA Insertion Date/Time: 01/28/2021 2:17 PM Performed by: Maryella Shivers, CRNA Pre-anesthesia Checklist: Patient identified, Emergency Drugs available, Suction available and Patient being monitored Patient Re-evaluated:Patient Re-evaluated prior to induction Oxygen Delivery Method: Circle system utilized Preoxygenation: Pre-oxygenation with 100% oxygen Induction Type: IV induction Ventilation: Mask ventilation without difficulty LMA: LMA inserted LMA Size: 4.0 Number of attempts: 1 Airway Equipment and Method: Bite block Placement Confirmation: positive ETCO2 Tube secured with: Tape Dental Injury: Teeth and Oropharynx as per pre-operative assessment

## 2021-01-28 NOTE — Discharge Instructions (Signed)
[facility] Phone Number [phone]  BREAST BIOPSY/ PARTIAL MASTECTOMY: POST OP INSTRUCTIONS Take 400 mg of ibuprofen every 8 hours or 650 mg tylenol every 6 hours for next 72 hours then as needed. Use ice several times daily also. Always review your discharge instruction sheet given to you by the facility where your surgery was performed.  IF YOU HAVE DISABILITY OR FAMILY LEAVE FORMS, YOU MUST BRING THEM TO THE OFFICE FOR PROCESSING.  DO NOT GIVE THEM TO YOUR DOCTOR.  1. A prescription for pain medication may be given to you upon discharge.  Take your pain medication as prescribed, if needed.  If narcotic pain medicine is not needed, then you may take acetaminophen (Tylenol), naprosyn (Alleve) or ibuprofen (Advil) as needed. No Tylenol or Ibuprofen until 7:30 if needed. 2. Take your usually prescribed medications unless otherwise directed 3. If you need a refill on your pain medication, please contact your pharmacy.  They will contact our office to request authorization.  Prescriptions will not be filled after 5pm or on week-ends. 4. You should eat very light the first 24 hours after surgery, such as soup, crackers, pudding, etc.  Resume your normal diet the day after surgery. 5. Most patients will experience some swelling and bruising in the breast.  Ice packs and a good support bra will help.  Wear the breast binder provided or a sports bra for 72 hours day and night.  After that wear a sports bra during the day until you return to the office. Swelling and bruising can take several days to resolve.  6. It is common to experience some constipation if taking pain medication after surgery.  Increasing fluid intake and taking a stool softener will usually help or prevent this problem from occurring.  A mild laxative (Milk of Magnesia or Miralax) should be taken according to package directions if there are no bowel movements after 48 hours. 7. Unless discharge instructions  indicate otherwise, you may remove your bandages 48 hours after surgery and you may shower at that time.  You may have steri-strips (small skin tapes) in place directly over the incision.  These strips should be left on the skin for 7-10 days and will come off on their own.  If your surgeon used skin glue on the incision, you may shower in 24 hours.  The glue will flake off over the next 2-3 weeks.  Any sutures or staples will be removed at the office during your follow-up visit. 8. ACTIVITIES:  You may resume regular daily activities (gradually increasing) beginning the next day.  Wearing a good support bra or sports bra minimizes pain and swelling.  You may have sexual intercourse when it is comfortable. a. You may drive when you no longer are taking prescription pain medication, you can comfortably wear a seatbelt, and you can safely maneuver your car and apply brakes. b. RETURN TO WORK:  ______________________________________________________________________________________ 9. You should see your doctor in the office for a follow-up appointment approximately two weeks after your surgery.  Your doctor's nurse will typically make your follow-up appointment when she calls you with your pathology report.  Expect your pathology report 3-4 business days after your surgery.  You may call to check if you do not hear from [place] after three days. 10. OTHER INSTRUCTIONS: _______________________________________________________________________________________________ _____________________________________________________________________________________________________________________________________ _____________________________________________________________________________________________________________________________________ _____________________________________________________________________________________________________________________________________  WHEN TO CALL DR WAKEFIELD: 1. Fever over 101.0 2. Nausea  and/or vomiting. 3. Extreme swelling or bruising. 4. Continued bleeding from incision. 5. Increased pain, redness,  or drainage from the incision.  The clinic staff is available to answer your questions during regular business hours.  Please don't hesitate to call and ask to speak to one of the nurses for clinical concerns.  If you have a medical emergency, go to the nearest emergency room or call 911.  A surgeon from [facility] is always on call at the hospital.  For further questions, please visit [email or web address]   Post Anesthesia Home Care Instructions  Activity: Get plenty of rest for the remainder of the day. A responsible individual must stay with you for 24 hours following the procedure.  For the next 24 hours, DO NOT: -Drive a car -Paediatric nurse -Drink alcoholic beverages -Take any medication unless instructed by your physician -Make any legal decisions or sign important papers.  Meals: Start with liquid foods such as gelatin or soup. Progress to regular foods as tolerated. Avoid greasy, spicy, heavy foods. If nausea and/or vomiting occur, drink only clear liquids until the nausea and/or vomiting subsides. Call your physician if vomiting continues.  Special Instructions/Symptoms: Your throat may feel dry or sore from the anesthesia or the breathing tube placed in your throat during surgery. If this causes discomfort, gargle with warm salt water. The discomfort should disappear within 24 hours.  If you had a scopolamine patch placed behind your ear for the management of post- operative nausea and/or vomiting:  1. The medication in the patch is effective for 72 hours, after which it should be removed.  Wrap patch in a tissue and discard in the trash. Wash hands thoroughly with soap and water. 2. You may remove the patch earlier than 72 hours if you experience unpleasant side effects which may include dry mouth, dizziness or visual disturbances. 3.  Avoid touching the patch. Wash your hands with soap and water after contact with the patch.

## 2021-01-28 NOTE — Op Note (Signed)
Preoperative diagnosis: Recurrent symptomatic left breast cyst Postoperative diagnosis: Same as above Procedure: Right breast cyst excisional biopsy Surgeon: Dr. Serita Grammes Anesthesia: General Estimated blood loss: Minimal Complications: None Drains: None Specimens: Left breast cysts marked with paint Special count was correct completion Disposition recovery stable condition  Indications:49 yof with prior history of breast cyst and aspiration presents with luoq and axillary pain. she has no dc.  there apparently was a left breast cyst aspirated in 2020 and then in Feb 2021.  she has also had another mm/us since then. she continues to palpate this mass and have pain at the site. she thinks the cysts have been drained three times and recurred each time.her latest imaging shows c density breasts. there are several oval masses in the uoq of the left breast that measure 2.9 cm in total. there are numerous other cysts on both sides but only ones that bother her are the luoq. she is here to discuss options.   Procedure: After informed consent was obtained and the patient I had both marked the cyst, she was taken to the operating room.  She was given antibiotics.  SCDs were in place.  She was placed under general anesthesia without complication.  She was prepped and draped in the standard sterile surgical fashion.  A surgical timeout was then performed.  Identified the cyst.  I did aspirate one of them to ensure the correct position.  I then infiltrated Marcaine and made a periareolar incision in order to hide the scar later.  I tunneled to the cyst.  There appeared to be several large cyst in very dense breast tissue.  I again confirmed these were the cystic structures but with aspirating clear yellow fluid.  I then remove the cyst in total.  She has a lot of dense tissue in that area but there was no other cystic tissue noted.  I then obtained hemostasis.  I marked the cyst and the excised  tissue with paint and passed this off the table.  I then closed the breast tissue with 2-0 Vicryl.  The skin was closed with 3-0 Vicryl and 5-0 Monocryl.  Glue and Steri-Strips were applied.  She tolerated this well was extubated and transferred to recovery stable.

## 2021-01-29 NOTE — Anesthesia Postprocedure Evaluation (Signed)
Anesthesia Post Note  Patient: Mahli Glahn Defeo  Procedure(s) Performed: LEFT BREAST MASS EXCISION (Left Breast)     Patient location during evaluation: PACU Anesthesia Type: General Level of consciousness: awake and alert Pain management: pain level controlled Vital Signs Assessment: post-procedure vital signs reviewed and stable Respiratory status: spontaneous breathing, nonlabored ventilation, respiratory function stable and patient connected to nasal cannula oxygen Cardiovascular status: blood pressure returned to baseline and stable Postop Assessment: no apparent nausea or vomiting Anesthetic complications: no   No complications documented.  Last Vitals:  Vitals:   01/28/21 1555 01/28/21 1611  BP:  (!) 146/70  Pulse: 77 76  Resp:    Temp:  (!) 36.3 C  SpO2: 97% 99%    Last Pain:  Vitals:   01/28/21 1611  TempSrc:   PainSc: 5                  Aydrian Halpin L Henok Heacock

## 2021-01-30 ENCOUNTER — Ambulatory Visit (INDEPENDENT_AMBULATORY_CARE_PROVIDER_SITE_OTHER): Payer: 59 | Admitting: Psychiatry

## 2021-01-30 ENCOUNTER — Encounter (HOSPITAL_BASED_OUTPATIENT_CLINIC_OR_DEPARTMENT_OTHER): Payer: Self-pay | Admitting: General Surgery

## 2021-01-30 ENCOUNTER — Other Ambulatory Visit: Payer: Self-pay

## 2021-01-30 ENCOUNTER — Encounter (HOSPITAL_COMMUNITY): Payer: Self-pay

## 2021-01-30 DIAGNOSIS — F4310 Post-traumatic stress disorder, unspecified: Secondary | ICD-10-CM | POA: Diagnosis not present

## 2021-01-30 DIAGNOSIS — F330 Major depressive disorder, recurrent, mild: Secondary | ICD-10-CM

## 2021-01-30 LAB — SURGICAL PATHOLOGY
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
## 2021-02-03 ENCOUNTER — Telehealth (INDEPENDENT_AMBULATORY_CARE_PROVIDER_SITE_OTHER): Payer: 59 | Admitting: Psychiatry

## 2021-02-03 ENCOUNTER — Encounter: Payer: Self-pay | Admitting: Psychiatry

## 2021-02-03 ENCOUNTER — Other Ambulatory Visit: Payer: Self-pay

## 2021-02-03 DIAGNOSIS — F330 Major depressive disorder, recurrent, mild: Secondary | ICD-10-CM | POA: Diagnosis not present

## 2021-02-03 DIAGNOSIS — F4310 Post-traumatic stress disorder, unspecified: Secondary | ICD-10-CM | POA: Diagnosis not present

## 2021-02-13 ENCOUNTER — Other Ambulatory Visit: Payer: Self-pay

## 2021-02-13 ENCOUNTER — Ambulatory Visit (INDEPENDENT_AMBULATORY_CARE_PROVIDER_SITE_OTHER): Payer: 59 | Admitting: Psychiatry

## 2021-02-13 DIAGNOSIS — F4310 Post-traumatic stress disorder, unspecified: Secondary | ICD-10-CM | POA: Diagnosis not present

## 2021-02-13 DIAGNOSIS — F330 Major depressive disorder, recurrent, mild: Secondary | ICD-10-CM
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
## 2021-02-27 ENCOUNTER — Ambulatory Visit (INDEPENDENT_AMBULATORY_CARE_PROVIDER_SITE_OTHER): Payer: 59 | Admitting: Psychiatry

## 2021-02-27 ENCOUNTER — Other Ambulatory Visit: Payer: Self-pay

## 2021-02-27 DIAGNOSIS — F4310 Post-traumatic stress disorder, unspecified: Secondary | ICD-10-CM

## 2021-02-27 DIAGNOSIS — F330 Major depressive disorder, recurrent, mild: Secondary | ICD-10-CM | POA: Diagnosis not present
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
## 2021-03-06 ENCOUNTER — Encounter (HOSPITAL_COMMUNITY): Payer: Self-pay
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
## 2021-03-13 ENCOUNTER — Other Ambulatory Visit: Payer: Self-pay

## 2021-03-13 ENCOUNTER — Ambulatory Visit (INDEPENDENT_AMBULATORY_CARE_PROVIDER_SITE_OTHER): Payer: 59 | Admitting: Psychiatry

## 2021-03-13 DIAGNOSIS — F330 Major depressive disorder, recurrent, mild: Secondary | ICD-10-CM | POA: Diagnosis not present

## 2021-03-13 DIAGNOSIS — F4310 Post-traumatic stress disorder, unspecified: Secondary | ICD-10-CM
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
## 2021-03-18 ENCOUNTER — Other Ambulatory Visit: Payer: Self-pay

## 2021-03-18 ENCOUNTER — Encounter: Payer: Self-pay | Admitting: Psychiatry

## 2021-03-18 ENCOUNTER — Telehealth (INDEPENDENT_AMBULATORY_CARE_PROVIDER_SITE_OTHER): Payer: 59 | Admitting: Psychiatry

## 2021-03-18 DIAGNOSIS — F330 Major depressive disorder, recurrent, mild: Secondary | ICD-10-CM | POA: Diagnosis not present

## 2021-03-18 DIAGNOSIS — F4310 Post-traumatic stress disorder, unspecified: Secondary | ICD-10-CM | POA: Diagnosis not present

## 2021-03-18 MED ORDER — HYDROXYZINE HCL 25 MG PO TABS: 25.0000 mg | ORAL_TABLET | Freq: Every day | ORAL | 0 refills | Status: DC | PRN

## 2021-03-18 MED ORDER — SERTRALINE HCL 100 MG PO TABS: 150.0000 mg | ORAL_TABLET | Freq: Every day | ORAL | 0 refills | Status: DC

## 2021-03-18 NOTE — Patient Instructions (Signed)
1.Increase sertraline 150 mg daily 2. Continuehydroxyzine 25 mg daily as needed for anxiety 3. Next appointment-5/27 at 10 AM

## 2021-03-27 ENCOUNTER — Other Ambulatory Visit: Payer: Self-pay

## 2021-03-27 ENCOUNTER — Ambulatory Visit (HOSPITAL_COMMUNITY): Payer: 59 | Admitting: Psychiatry

## 2021-04-10 ENCOUNTER — Other Ambulatory Visit: Payer: Self-pay

## 2021-04-10 ENCOUNTER — Ambulatory Visit (INDEPENDENT_AMBULATORY_CARE_PROVIDER_SITE_OTHER): Payer: 59 | Admitting: Psychiatry

## 2021-04-10 DIAGNOSIS — F4310 Post-traumatic stress disorder, unspecified: Secondary | ICD-10-CM | POA: Diagnosis not present

## 2021-04-10 DIAGNOSIS — F330 Major depressive disorder, recurrent, mild: Secondary | ICD-10-CM | POA: Diagnosis not present
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
## 2021-04-18 ENCOUNTER — Encounter: Payer: Self-pay | Admitting: Psychiatry

## 2021-04-18 ENCOUNTER — Other Ambulatory Visit: Payer: Self-pay

## 2021-04-18 ENCOUNTER — Telehealth (INDEPENDENT_AMBULATORY_CARE_PROVIDER_SITE_OTHER): Payer: 59 | Admitting: Psychiatry

## 2021-04-18 DIAGNOSIS — F4310 Post-traumatic stress disorder, unspecified: Secondary | ICD-10-CM

## 2021-04-18 DIAGNOSIS — F330 Major depressive disorder, recurrent, mild: Secondary | ICD-10-CM | POA: Diagnosis not present

## 2021-04-18 NOTE — Patient Instructions (Signed)
1.Continue sertraline 150 mg daily 2. Continuehydroxyzine 25 mg daily as needed for anxiety 3. Next appointment-7/22 at 10:20 AM

## 2021-04-28 ENCOUNTER — Ambulatory Visit (INDEPENDENT_AMBULATORY_CARE_PROVIDER_SITE_OTHER): Payer: 59 | Admitting: Psychiatry

## 2021-04-28 ENCOUNTER — Other Ambulatory Visit: Payer: Self-pay

## 2021-04-28 DIAGNOSIS — F330 Major depressive disorder, recurrent, mild: Secondary | ICD-10-CM | POA: Diagnosis not present

## 2021-04-28 DIAGNOSIS — F4310 Post-traumatic stress disorder, unspecified: Secondary | ICD-10-CM | POA: Diagnosis not present
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
## 2021-05-12 ENCOUNTER — Ambulatory Visit (INDEPENDENT_AMBULATORY_CARE_PROVIDER_SITE_OTHER): Payer: 59 | Admitting: Psychiatry

## 2021-05-12 ENCOUNTER — Other Ambulatory Visit: Payer: Self-pay

## 2021-05-12 DIAGNOSIS — F330 Major depressive disorder, recurrent, mild: Secondary | ICD-10-CM | POA: Diagnosis not present
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
## 2021-05-27 ENCOUNTER — Other Ambulatory Visit: Payer: Self-pay

## 2021-05-27 ENCOUNTER — Ambulatory Visit (INDEPENDENT_AMBULATORY_CARE_PROVIDER_SITE_OTHER): Payer: 59 | Admitting: Psychiatry

## 2021-05-27 DIAGNOSIS — F330 Major depressive disorder, recurrent, mild: Secondary | ICD-10-CM | POA: Diagnosis not present
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
## 2021-06-10 ENCOUNTER — Other Ambulatory Visit: Payer: Self-pay

## 2021-06-10 ENCOUNTER — Ambulatory Visit (INDEPENDENT_AMBULATORY_CARE_PROVIDER_SITE_OTHER): Payer: 59 | Admitting: Psychiatry

## 2021-06-10 DIAGNOSIS — F330 Major depressive disorder, recurrent, mild: Secondary | ICD-10-CM
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
## 2021-06-13 ENCOUNTER — Telehealth (INDEPENDENT_AMBULATORY_CARE_PROVIDER_SITE_OTHER): Payer: 59 | Admitting: Psychiatry

## 2021-06-13 ENCOUNTER — Encounter: Payer: Self-pay | Admitting: Psychiatry

## 2021-06-13 DIAGNOSIS — F330 Major depressive disorder, recurrent, mild: Secondary | ICD-10-CM

## 2021-06-13 DIAGNOSIS — F4310 Post-traumatic stress disorder, unspecified: Secondary | ICD-10-CM

## 2021-06-13 MED ORDER — PRAZOSIN HCL 1 MG PO CAPS
1.0000 mg | ORAL_CAPSULE | Freq: Every day | ORAL | 0 refills | Status: DC
Start: 2021-06-13 — End: 2021-09-01

## 2021-06-13 MED ORDER — PRAZOSIN HCL 2 MG PO CAPS: ORAL_CAPSULE | ORAL | 1 refills | Status: DC

## 2021-06-13 MED ORDER — HYDROXYZINE HCL 25 MG PO TABS
25.0000 mg | ORAL_TABLET | Freq: Every day | ORAL | 0 refills | Status: DC | PRN
Start: 2021-07-02 — End: 2021-09-01

## 2021-06-13 MED ORDER — SERTRALINE HCL 100 MG PO TABS: 150.0000 mg | ORAL_TABLET | Freq: Every day | ORAL | 0 refills | Status: DC

## 2021-06-13 NOTE — Patient Instructions (Signed)
1. Continue sertraline 150 mg daily  2. Continue hydroxyzine 25 mg daily as needed for anxiety  3. Start prazosin 1 mg at night for 3 days, then 2 mg at night 3. Next appointment- 9/1 at 11:30

## 2021-07-01 ENCOUNTER — Other Ambulatory Visit: Payer: Self-pay

## 2021-07-01 ENCOUNTER — Ambulatory Visit (INDEPENDENT_AMBULATORY_CARE_PROVIDER_SITE_OTHER): Payer: 59 | Admitting: Psychiatry

## 2021-07-01 DIAGNOSIS — F4310 Post-traumatic stress disorder, unspecified: Secondary | ICD-10-CM

## 2021-07-01 DIAGNOSIS — F330 Major depressive disorder, recurrent, mild: Secondary | ICD-10-CM | POA: Diagnosis not present
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
## 2021-07-15 ENCOUNTER — Other Ambulatory Visit: Payer: Self-pay

## 2021-07-15 ENCOUNTER — Ambulatory Visit (INDEPENDENT_AMBULATORY_CARE_PROVIDER_SITE_OTHER): Payer: 59 | Admitting: Psychiatry

## 2021-07-15 ENCOUNTER — Encounter (HOSPITAL_COMMUNITY): Payer: Self-pay

## 2021-07-15 DIAGNOSIS — F330 Major depressive disorder, recurrent, mild: Secondary | ICD-10-CM | POA: Diagnosis not present

## 2021-07-15 DIAGNOSIS — F4310 Post-traumatic stress disorder, unspecified: Secondary | ICD-10-CM
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
## 2021-07-24 ENCOUNTER — Other Ambulatory Visit: Payer: Self-pay

## 2021-07-24 ENCOUNTER — Telehealth (INDEPENDENT_AMBULATORY_CARE_PROVIDER_SITE_OTHER): Payer: 59 | Admitting: Psychiatry

## 2021-07-24 ENCOUNTER — Encounter: Payer: Self-pay | Admitting: Psychiatry

## 2021-07-24 DIAGNOSIS — F4310 Post-traumatic stress disorder, unspecified: Secondary | ICD-10-CM

## 2021-07-24 DIAGNOSIS — F330 Major depressive disorder, recurrent, mild: Secondary | ICD-10-CM

## 2021-07-24 MED ORDER — PRAZOSIN HCL 2 MG PO CAPS
2.0000 mg | ORAL_CAPSULE | Freq: Every day | ORAL | 0 refills | Status: DC
Start: 2021-08-13 — End: 2021-09-01

## 2021-07-24 NOTE — Patient Instructions (Signed)
1. Continue sertraline 150 mg daily  2. Continue hydroxyzine 25 mg daily as needed for anxiety  3. Continue prazosin 2 mg at night 3. Next appointment- 10/10 at 4:30

## 2021-07-31 ENCOUNTER — Ambulatory Visit (INDEPENDENT_AMBULATORY_CARE_PROVIDER_SITE_OTHER): Payer: 59 | Admitting: Psychiatry

## 2021-07-31 ENCOUNTER — Other Ambulatory Visit: Payer: Self-pay

## 2021-07-31 DIAGNOSIS — F330 Major depressive disorder, recurrent, mild: Secondary | ICD-10-CM | POA: Diagnosis not present
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
## 2021-08-21 ENCOUNTER — Other Ambulatory Visit: Payer: Self-pay

## 2021-08-21 ENCOUNTER — Ambulatory Visit (INDEPENDENT_AMBULATORY_CARE_PROVIDER_SITE_OTHER): Payer: 59 | Admitting: Psychiatry

## 2021-08-21 DIAGNOSIS — F4310 Post-traumatic stress disorder, unspecified: Secondary | ICD-10-CM | POA: Diagnosis not present

## 2021-08-21 DIAGNOSIS — F330 Major depressive disorder, recurrent, mild: Secondary | ICD-10-CM
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
## 2021-09-01 ENCOUNTER — Encounter: Payer: Self-pay | Admitting: Psychiatry

## 2021-09-01 ENCOUNTER — Other Ambulatory Visit: Payer: Self-pay

## 2021-09-01 ENCOUNTER — Telehealth (INDEPENDENT_AMBULATORY_CARE_PROVIDER_SITE_OTHER): Payer: 59 | Admitting: Psychiatry

## 2021-09-01 DIAGNOSIS — F4310 Post-traumatic stress disorder, unspecified: Secondary | ICD-10-CM | POA: Diagnosis not present

## 2021-09-01 DIAGNOSIS — F330 Major depressive disorder, recurrent, mild: Secondary | ICD-10-CM

## 2021-09-01 MED ORDER — SERTRALINE HCL 100 MG PO TABS: 200.0000 mg | ORAL_TABLET | Freq: Every day | ORAL | 0 refills | Status: DC

## 2021-09-01 MED ORDER — HYDROXYZINE HCL 25 MG PO TABS: 25.0000 mg | ORAL_TABLET | Freq: Every day | ORAL | 0 refills | Status: DC | PRN

## 2021-09-01 MED ORDER — PRAZOSIN HCL 2 MG PO CAPS
2.0000 mg | ORAL_CAPSULE | Freq: Every day | ORAL | 0 refills | Status: DC
Start: 2021-11-12 — End: 2022-01-23

## 2021-09-01 NOTE — Patient Instructions (Signed)
1. Continue sertraline 200 mg daily  2. Continue hydroxyzine 25 mg daily as needed for anxiety  3. Continue prazosin 2 mg at night 3. Next appointment- 1/6 at 10 AM,

## 2021-09-09 ENCOUNTER — Other Ambulatory Visit: Payer: Self-pay

## 2021-09-09 ENCOUNTER — Ambulatory Visit (INDEPENDENT_AMBULATORY_CARE_PROVIDER_SITE_OTHER): Payer: 59 | Admitting: Psychiatry

## 2021-09-09 DIAGNOSIS — F330 Major depressive disorder, recurrent, mild: Secondary | ICD-10-CM | POA: Diagnosis not present

## 2021-09-09 DIAGNOSIS — F4310 Post-traumatic stress disorder, unspecified: Secondary | ICD-10-CM | POA: Diagnosis not present
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
## 2021-09-23 ENCOUNTER — Other Ambulatory Visit: Payer: Self-pay

## 2021-09-23 ENCOUNTER — Ambulatory Visit (INDEPENDENT_AMBULATORY_CARE_PROVIDER_SITE_OTHER): Payer: 59 | Admitting: Psychiatry

## 2021-09-23 DIAGNOSIS — F4310 Post-traumatic stress disorder, unspecified: Secondary | ICD-10-CM

## 2021-09-23 DIAGNOSIS — F330 Major depressive disorder, recurrent, mild: Secondary | ICD-10-CM | POA: Diagnosis not present
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
## 2021-10-07 ENCOUNTER — Other Ambulatory Visit: Payer: Self-pay

## 2021-10-07 ENCOUNTER — Ambulatory Visit (INDEPENDENT_AMBULATORY_CARE_PROVIDER_SITE_OTHER): Payer: 59 | Admitting: Psychiatry

## 2021-10-07 DIAGNOSIS — F330 Major depressive disorder, recurrent, mild: Secondary | ICD-10-CM | POA: Diagnosis not present
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
## 2021-10-30 ENCOUNTER — Other Ambulatory Visit: Payer: Self-pay

## 2021-10-30 ENCOUNTER — Ambulatory Visit (INDEPENDENT_AMBULATORY_CARE_PROVIDER_SITE_OTHER): Payer: 59 | Admitting: Psychiatry

## 2021-10-30 DIAGNOSIS — F4310 Post-traumatic stress disorder, unspecified: Secondary | ICD-10-CM | POA: Diagnosis not present

## 2021-10-30 DIAGNOSIS — F330 Major depressive disorder, recurrent, mild: Secondary | ICD-10-CM | POA: Diagnosis not present
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
## 2021-11-13 ENCOUNTER — Other Ambulatory Visit: Payer: Self-pay

## 2021-11-13 ENCOUNTER — Ambulatory Visit (INDEPENDENT_AMBULATORY_CARE_PROVIDER_SITE_OTHER): Payer: 59 | Admitting: Psychiatry

## 2021-11-13 DIAGNOSIS — F4310 Post-traumatic stress disorder, unspecified: Secondary | ICD-10-CM

## 2021-11-13 DIAGNOSIS — F330 Major depressive disorder, recurrent, mild: Secondary | ICD-10-CM
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
## 2021-11-27 ENCOUNTER — Ambulatory Visit (INDEPENDENT_AMBULATORY_CARE_PROVIDER_SITE_OTHER): Payer: 59 | Admitting: Psychiatry

## 2021-11-27 ENCOUNTER — Other Ambulatory Visit: Payer: Self-pay

## 2021-11-27 DIAGNOSIS — F4310 Post-traumatic stress disorder, unspecified: Secondary | ICD-10-CM | POA: Diagnosis not present

## 2021-11-27 DIAGNOSIS — F330 Major depressive disorder, recurrent, mild: Secondary | ICD-10-CM
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
## 2021-11-28 ENCOUNTER — Other Ambulatory Visit: Payer: Self-pay

## 2021-11-28 ENCOUNTER — Telehealth (INDEPENDENT_AMBULATORY_CARE_PROVIDER_SITE_OTHER): Payer: 59 | Admitting: Psychiatry

## 2021-11-28 ENCOUNTER — Encounter: Payer: Self-pay | Admitting: Psychiatry

## 2021-11-28 DIAGNOSIS — F330 Major depressive disorder, recurrent, mild: Secondary | ICD-10-CM

## 2021-11-28 DIAGNOSIS — F4310 Post-traumatic stress disorder, unspecified: Secondary | ICD-10-CM | POA: Diagnosis not present

## 2021-11-28 MED ORDER — SERTRALINE HCL 100 MG PO TABS: 200.0000 mg | ORAL_TABLET | Freq: Every day | ORAL | 1 refills | Status: DC

## 2021-11-28 MED ORDER — HYDROXYZINE HCL 25 MG PO TABS: 25.0000 mg | ORAL_TABLET | Freq: Every day | ORAL | 0 refills | Status: DC | PRN

## 2021-11-28 NOTE — Patient Instructions (Signed)
1. Continue sertraline 200 mg daily  2. Continue hydroxyzine 25 mg daily as needed for anxiety  3. Continue prazosin 2 mg at night 3. Next appointment- 3/3 at 9 AM

## 2021-12-11 ENCOUNTER — Other Ambulatory Visit: Payer: Self-pay

## 2021-12-11 ENCOUNTER — Ambulatory Visit (INDEPENDENT_AMBULATORY_CARE_PROVIDER_SITE_OTHER): Payer: 59 | Admitting: Psychiatry

## 2021-12-11 DIAGNOSIS — F4310 Post-traumatic stress disorder, unspecified: Secondary | ICD-10-CM

## 2021-12-11 DIAGNOSIS — F330 Major depressive disorder, recurrent, mild: Secondary | ICD-10-CM
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
## 2021-12-25 ENCOUNTER — Ambulatory Visit (INDEPENDENT_AMBULATORY_CARE_PROVIDER_SITE_OTHER): Payer: 59 | Admitting: Psychiatry

## 2021-12-25 ENCOUNTER — Other Ambulatory Visit: Payer: Self-pay

## 2021-12-25 DIAGNOSIS — F4310 Post-traumatic stress disorder, unspecified: Secondary | ICD-10-CM

## 2021-12-25 DIAGNOSIS — F330 Major depressive disorder, recurrent, mild: Secondary | ICD-10-CM | POA: Diagnosis not present
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
## 2021-12-26 ENCOUNTER — Telehealth: Payer: Self-pay | Admitting: Hematology and Oncology

## 2021-12-26 NOTE — Telephone Encounter (Signed)
Scheduled appt per 2/3 referral. Pt is aware of appt date and time. Pt is aware to arrive 15 mins prior to appt time.  °

## 2021-12-29 ENCOUNTER — Other Ambulatory Visit: Payer: Self-pay | Admitting: General Surgery

## 2021-12-29 ENCOUNTER — Other Ambulatory Visit (HOSPITAL_COMMUNITY): Payer: Self-pay | Admitting: General Surgery

## 2021-12-29 DIAGNOSIS — C50212 Malignant neoplasm of upper-inner quadrant of left female breast: Secondary | ICD-10-CM

## 2021-12-30 ENCOUNTER — Other Ambulatory Visit: Payer: Self-pay | Admitting: General Surgery

## 2021-12-30 ENCOUNTER — Ambulatory Visit (HOSPITAL_COMMUNITY)
Admission: RE | Admit: 2021-12-30 | Discharge: 2021-12-30 | Disposition: A | Discharge status: Home / Self Care | Payer: 59 | Source: Ambulatory Visit | Attending: General Surgery | Admitting: General Surgery

## 2021-12-30 ENCOUNTER — Other Ambulatory Visit: Payer: Self-pay

## 2021-12-30 DIAGNOSIS — Z170 Estrogen receptor positive status [ER+]: Secondary | ICD-10-CM | POA: Diagnosis present

## 2021-12-30 DIAGNOSIS — C50212 Malignant neoplasm of upper-inner quadrant of left female breast: Secondary | ICD-10-CM | POA: Diagnosis not present

## 2021-12-30 IMAGING — MR MR BREAST BILAT WO/W CM
8 of 11 series · 27 of 48 positions shown · IV contrast (8 ML GADAVIST)
Comparison: Previous exam(s).

CLINICAL DATA: 50-year-old female with newly diagnosed left breast
cancer and left axillary lymph node biopsy.

EXAM:
BILATERAL BREAST MRI WITH AND WITHOUT CONTRAST
TECHNIQUE: Multiplanar, multisequence MR images of both breasts were obtained
prior to and following the intravenous administration of 8 ml of
Gadavist.

[Series 2: T2 · axial · 3.0mm · 0.89mm/px · 1 of 64 slices shown]
[im 1/64]
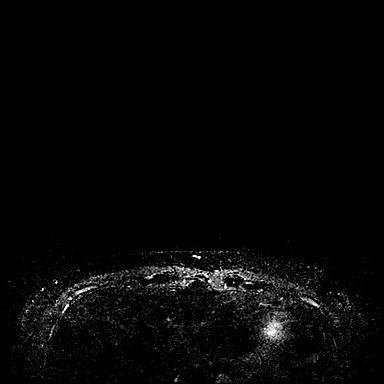

[Series 4: T1 fat-sat · axial · 1.2mm · 0.76mm/px · z∈[-115,+76]mm · 5 of 160 slices shown (1 of 4)]
[im 1/160]
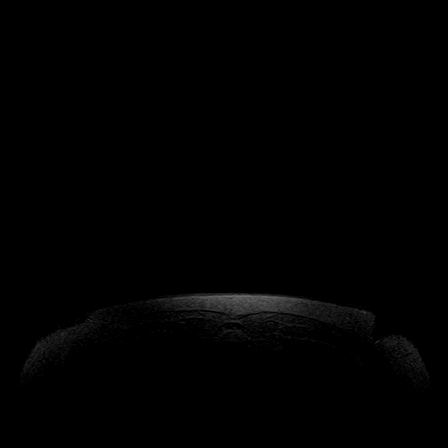
[im 40/160]
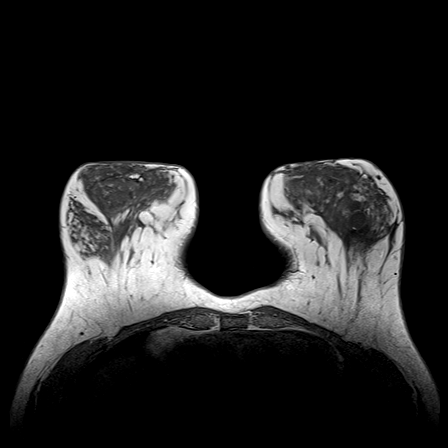
[im 80/160]
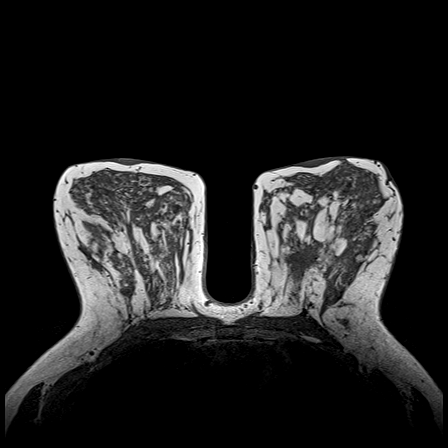
[im 120/160]
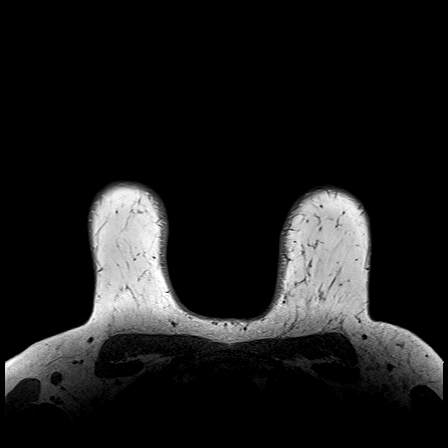
[im 160/160]
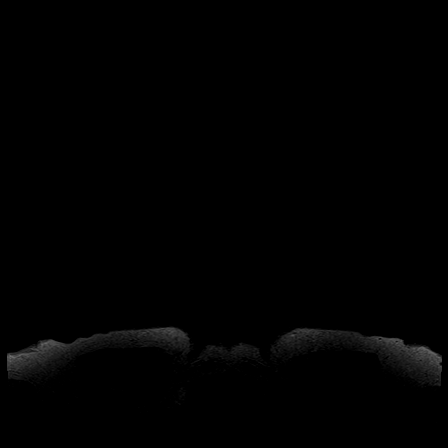

[Series 6: T1 fat-sat · axial · 1.2mm · 0.82mm/px · z∈[-115,+76]mm · 5 of 160 slices shown (2 of 4)]
[im 1/160]
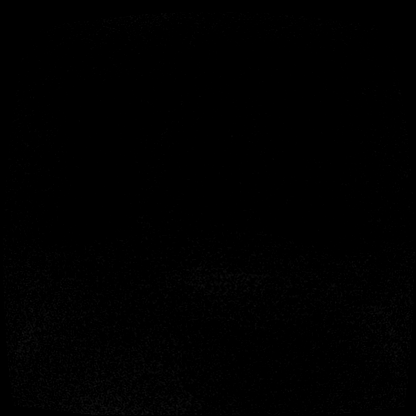
[im 40/160]
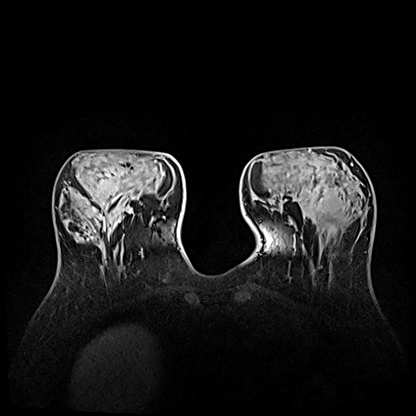
[im 80/160]
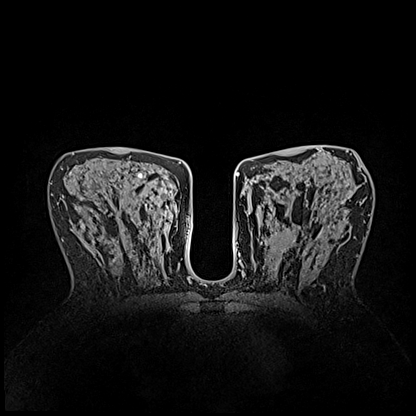
[im 120/160]
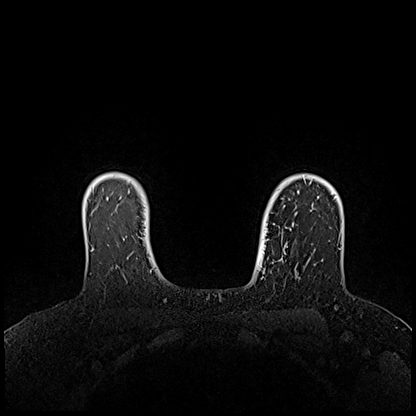
[im 160/160]
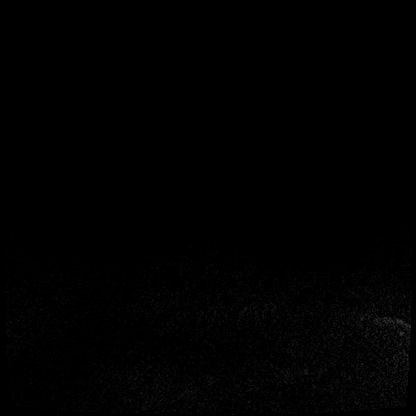

[Series 7: T1 fat-sat · axial · 1.2mm · 0.82mm/px · z∈[-115,+76]mm · 5 of 160 slices shown (3 of 4)]
[im 1/160]
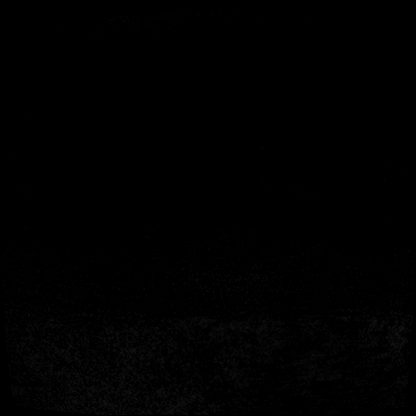
[im 40/160]
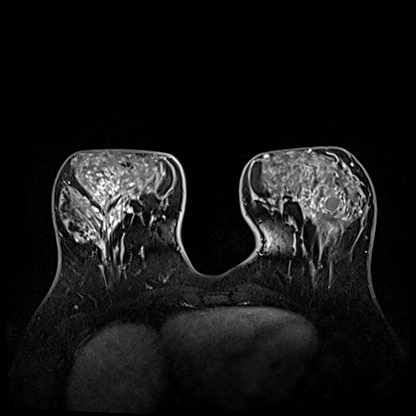
[im 80/160]
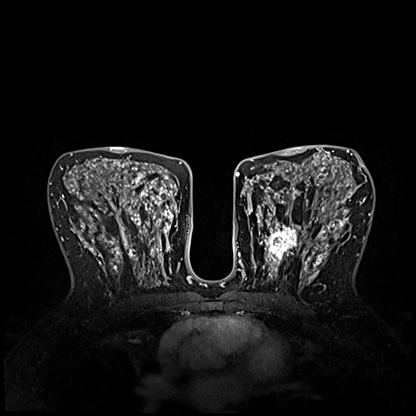
[im 120/160]
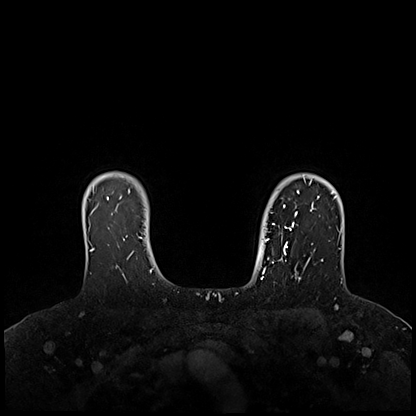
[im 160/160]
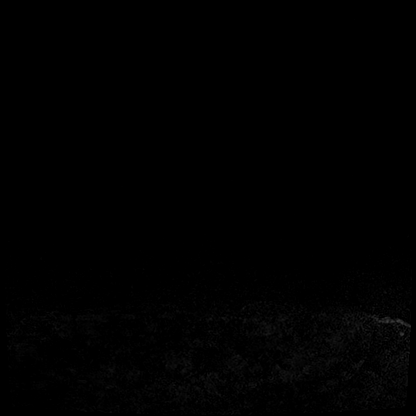

[Series 8: T1 · axial · 1.2mm · 0.82mm/px · z∈[-115,+76]mm · 6 of 160 slices shown (1 of 3)]
[im 1/160]
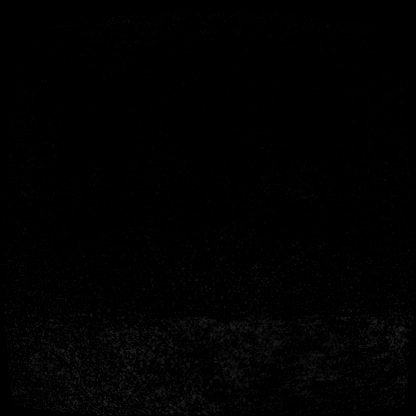
[im 32/160]
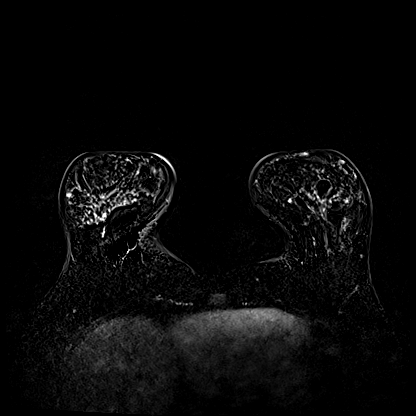
[im 64/160]
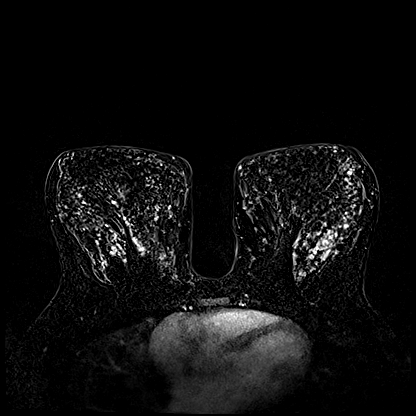
[im 96/160]
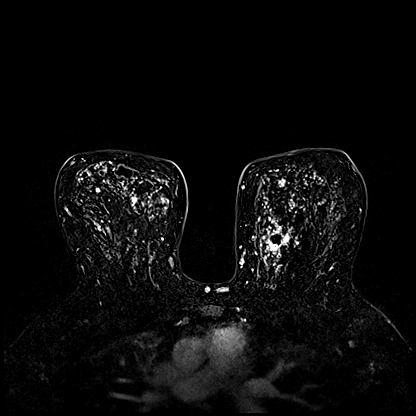
[im 128/160]
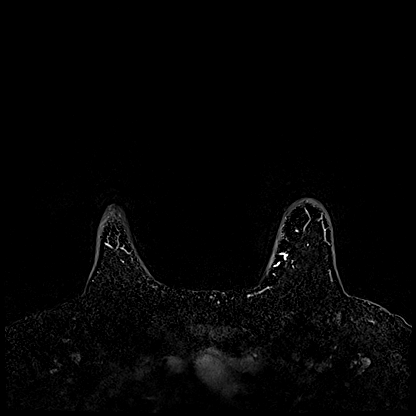
[im 160/160]
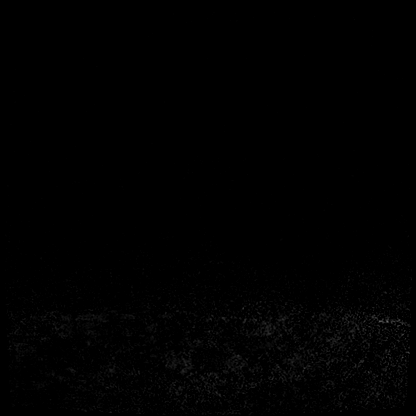

[Series 9: T1 · coronal · 340.0mm · 0.82mm/px · 1 of 3 slices shown (2 of 3)]
[im 1/3]
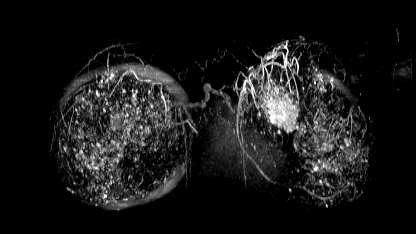

[Series 10: T1 · axial · 192.0mm · 0.82mm/px · 1 of 3 slices shown (3 of 3)]
[im 1/3]
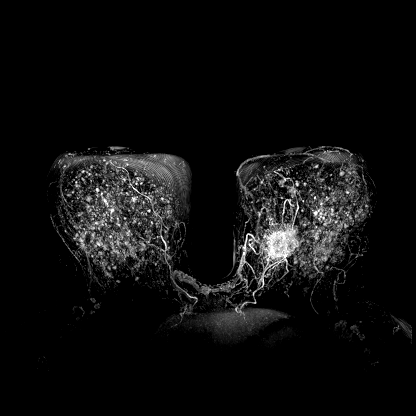

[Series 11: T1 fat-sat · axial · 1.2mm · 0.82mm/px · z∈[-115,-39]mm · 3 of 160 slices shown (4 of 4)]
[im 1/160]
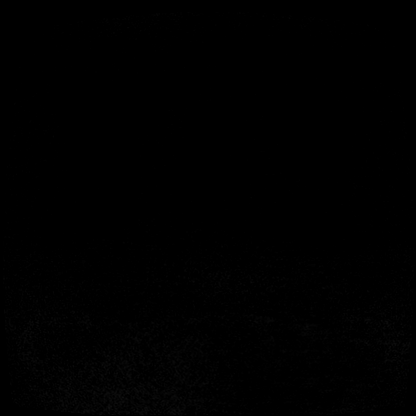
[im 32/160]
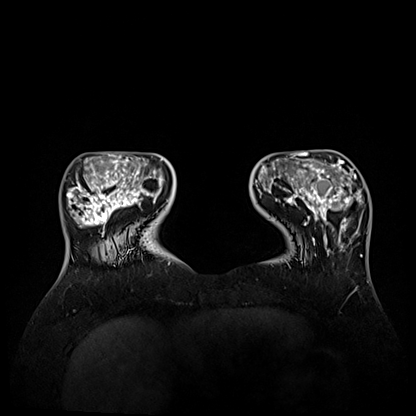
[im 64/160]
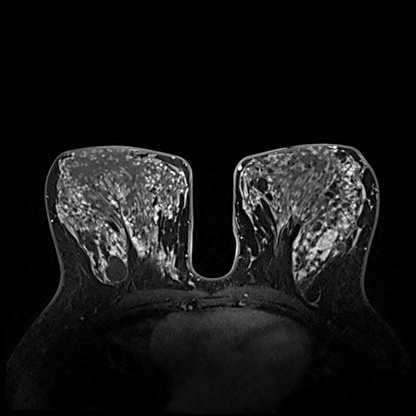

[27 of 48 positions shown; findings below may reference images not displayed]

Three-dimensional MR images were rendered by post-processing of the
original MR data on an independent workstation. The
three-dimensional MR images were interpreted, and findings are
reported in the following complete MRI report for this study. Three
dimensional images were evaluated at the independent interpreting
workstation using the DynaCAD thin client.
FINDINGS: Breast composition: d. Extreme fibroglandular tissue. Note is made
of numerous circumscribed nonenhancing T2 hyperdense cysts scattered
throughout the bilateral breasts.

Background parenchymal enhancement: Marked.

Right breast: No suspicious mass or abnormal enhancement beyond
background parenchymal enhancement.

Left breast: Susceptibility artifact from post biopsy clip is seen
in association with an irregular, spiculated mass with heterogeneous
enhancement in the upper inner quadrant of the left breast
posteriorly (series 9, image 77/160). The mass measures 3.1 x 2.5 x
2.5 cm. This is consistent with the patient's biopsy-proven
malignancy. A similar appearing mass is seen anterior to the index
lesion along the superior central left breast at mid to anterior
depth (series 8, image 79/160). It measures 1.2 x 0.9 x 0.9 cm. This
is concerning for an additional site of malignancy. The overall span
of suspicious findings is greater than 6 cm in the AP dimension.

Lymph nodes: There are numerous (greater than 10 per side) prominent
and morphologically abnormal level 1 and 2 lymph nodes bilaterally.
A level 1 lymph node in the left axilla demonstrates susceptibility
artifact from post biopsy marking clip. There is no suspicious
internal mammary lymphadenopathy.

Ancillary findings:  None.
IMPRESSION: 1. 3.1 cm mass in the upper inner quadrant of the left breast
consistent with the patient's biopsy-proven malignancy.
2. Additional 1.2 cm mass in the upper inner left breast at mid
depth (series 8, image 79), suggestive of an additional site of
disease. The overall span of suspicious findings is greater than 6
cm in the AP dimension.
3. Numerous prominent in morphologically abnormal level 1 and 2
axillary lymph nodes bilaterally. One of these was previously
biopsied with benign results. Given the bilaterality, findings may
represent a lymphoproliferative process.
4. No MRI evidence of malignancy within the right breast.
5. Please note, evaluation is slightly limited due to marked
background parenchymal enhancement.

RECOMMENDATION:
1. Second-look ultrasound of the right axilla for further evaluation
of bilateral lymphadenopathy. Ultrasound-guided biopsy can be
performed at that time and samples should be sent to the lab in both
formalin and saline for evaluation of a lymphoproliferative process.
2. Additional ultrasound evaluation and possible biopsy of left
breast can be performed at the same time. If an ultrasound correlate
cannot be identified for the 1.2 cm mass in the upper inner left
breast, MRI guided biopsy is recommended.

BI-RADS CATEGORY  4: Suspicious.

## 2021-12-30 MED ORDER — GADOBUTROL 1 MMOL/ML IV SOLN
8.0000 mL | Freq: Once | INTRAVENOUS | Status: AC | PRN
  Administered 2021-12-30: 8 mL via INTRAVENOUS

## 2021-12-31 ENCOUNTER — Encounter: Payer: Self-pay | Admitting: Adult Health

## 2021-12-31 ENCOUNTER — Other Ambulatory Visit: Payer: Self-pay

## 2021-12-31 ENCOUNTER — Encounter: Payer: Self-pay | Admitting: Hematology and Oncology

## 2021-12-31 ENCOUNTER — Inpatient Hospital Stay: Payer: 59 | Attending: Hematology and Oncology | Admitting: Hematology and Oncology

## 2021-12-31 VITALS — BP 125/52 | HR 72 | Temp 97.7°F | Resp 16 | Ht 67.0 in | Wt 211.8 lb

## 2021-12-31 DIAGNOSIS — Z5189 Encounter for other specified aftercare: Secondary | ICD-10-CM | POA: Diagnosis not present

## 2021-12-31 DIAGNOSIS — Z170 Estrogen receptor positive status [ER+]: Secondary | ICD-10-CM

## 2021-12-31 DIAGNOSIS — D649 Anemia, unspecified: Secondary | ICD-10-CM | POA: Insufficient documentation

## 2021-12-31 DIAGNOSIS — C50212 Malignant neoplasm of upper-inner quadrant of left female breast: Secondary | ICD-10-CM

## 2021-12-31 DIAGNOSIS — Z5111 Encounter for antineoplastic chemotherapy: Secondary | ICD-10-CM | POA: Insufficient documentation

## 2021-12-31 MED ORDER — PROCHLORPERAZINE MALEATE 10 MG PO TABS: 10.0000 mg | ORAL_TABLET | Freq: Four times a day (QID) | ORAL | 1 refills | Status: DC | PRN

## 2021-12-31 MED ORDER — LIDOCAINE-PRILOCAINE 2.5-2.5 % EX CREA: TOPICAL_CREAM | CUTANEOUS | 3 refills | Status: DC

## 2021-12-31 MED ORDER — LORAZEPAM 0.5 MG PO TABS: 0.5000 mg | ORAL_TABLET | Freq: Four times a day (QID) | ORAL | 0 refills | Status: DC | PRN

## 2021-12-31 MED ORDER — DEXAMETHASONE 4 MG PO TABS: 8.0000 mg | ORAL_TABLET | Freq: Two times a day (BID) | ORAL | 1 refills | Status: DC

## 2021-12-31 MED ORDER — ONDANSETRON HCL 8 MG PO TABS: 8.0000 mg | ORAL_TABLET | Freq: Two times a day (BID) | ORAL | 1 refills | Status: DC | PRN

## 2021-12-31 NOTE — Assessment & Plan Note (Signed)
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  We have discussed about the pathology and prognostics in detail today.  Given tumor size close to 3 cm, node-negative and possible functional triple negative breast cancer, we have discussed about neoadjuvant chemotherapy with TC, docetaxel and cyclophosphamide for 4 cycles.  I discussed about mechanism of action of chemotherapy, adverse effects of chemotherapy including but not limited to fatigue, nausea, vomiting, diarrhea, alopecia, neuropathy, cytopenias and increased risk of infections.  We have discussed about pegfilgrastim of filgrastim for growth factor support to reduce her risk of infections. Post chemotherapy, she will proceed with surgery and adjuvant radiation followed by adjuvant antiestrogen therapy. Given PR negative and weak ER positivity, it is likely that she will have a good response.  She understands that there is no survival benefit of giving chemotherapy neoadjuvantly or adjuvantly, the only benefit could be change in the surgical outcomes in this scenario. She is agreeable to all the recommendations.  Chemo teach will have to be scheduled, chemotherapy anticipate in the next 1 week.  We have discussed about port placement but she is okay to proceed with peripheral IV infusion since it is only 4 treatments. Return to clinic in 7 days to 10 days, prefer MD visit, labs and chemotherapy on the same day.

## 2021-12-31 NOTE — Progress Notes (Signed)
[place] CONSULT NOTE  Patient Care Team: [name], MD as PCP - General (Family Medicine)  CHIEF COMPLAINTS/PURPOSE OF CONSULTATION:  Newly diagnosed breast cancer  HISTORY OF PRESENTING ILLNESS:  Maria Diaz 51 y.o. female is here because of recent diagnosis of left breast cancer  Mammogram, 2.6 x 2 cm irregular mass with a spiculated and indistinct margin in the left breast at 10:00 posterior depth 10 cm from the nipple correlates to the palpated mass, there is architectural distortion associated with the mass, no other suspicious masses or calcifications seen in either breast. Initial pathology could not comment on the grade, prognostics were ER 10% weak, PR negative, Ki 67 25%, HER2 not performed due to block tissue exhausted.  She had a repeat biopsy which showed a grade 3 IDC, prognostics on this 1 were reported verbally as ER 40% weak, PR negative and HER2 negative KI of 30%.  Biopsy from the lymph node did not show any evidence of malignancy.  She is now referred to medical oncology for recommendations.  She is healthy at baseline, denies any past medical history.  She is here with her sister today to discuss recommendations.  She has felt this mass for several months but thought this was another cyst, she had a breast cyst in the past.  She has 3 kids.  Rest of the pertinent 10 point ROS reviewed and negative. I reviewed her records extensively and collaborated the history with the patient.  SUMMARY OF ONCOLOGIC HISTORY: Oncology History  Malignant neoplasm of upper-inner quadrant of left breast in female, estrogen receptor positive ([place])  12/31/2021 Initial Diagnosis   Malignant neoplasm of upper-inner quadrant of left breast in female, estrogen receptor positive ([place])   12/31/2021 Cancer Staging   Staging form: Breast, AJCC 8th Edition - Clinical: cT2, cN0, G3, ER+, PR-, HER2- - Signed by Benay Pike, MD on 12/31/2021 Stage prefix: Initial  diagnosis Histologic grading system: 3 grade system    01/07/2022 -  Chemotherapy   Patient is on Treatment Plan : BREAST TC q21d        MEDICAL HISTORY:  Past Medical History:  Diagnosis Date   Anemia    Anxiety    Depression    Glaucoma     SURGICAL HISTORY: Past Surgical History:  Procedure Laterality Date   BREAST CYST EXCISION Left 01/28/2021   Procedure: LEFT BREAST MASS EXCISION;  Surgeon: Rolm Bookbinder, MD;  Location: [place];  Service: General;  Laterality: Left;  START TIME OF 3:00 PM FOR 60 MINUTES IN ROOM 8   HYSTEROSCOPY W/ ENDOMETRIAL ABLATION  08/14/2020   [facility]    SOCIAL HISTORY: Social History   Socioeconomic History   Marital status: Legally Separated    Spouse name: Not on file   Number of children: Not on file   Years of education: Not on file   Highest education level: Not on file  Occupational History   Not on file  Tobacco Use   Smoking status: Never   Smokeless tobacco: Never  Substance and Sexual Activity   Alcohol use: Yes    Alcohol/week: 1.0 standard drink    Types: 1 Glasses of wine per week    Comment: holidays   Drug use: Never   Sexual activity: Yes    Birth control/protection: None  Other Topics Concern   Not on file  Social History Narrative   Not on file   Social Determinants of Health   Financial Resource Strain:  Not on file  Food Insecurity: Not on file  Transportation Needs: Not on file  Physical Activity: Not on file  Stress: Not on file  Social Connections: Not on file  Intimate Partner Violence: Not on file    FAMILY HISTORY: Family History  Problem Relation Age of Onset   Anxiety disorder Sister    Drug abuse Mother     ALLERGIES:  is allergic to haemophilus influenzae vaccines and penicillins.  MEDICATIONS:  Current Outpatient Medications  Medication Sig Dispense Refill   dexamethasone (DECADRON) 4 MG tablet Take 2 tablets (8 mg total) by mouth 2 (two) times daily. Start the day  before Taxotere. Then again the day after chemo for 3 days. 30 tablet 1   EPINEPHrine 0.3 mg/0.3 mL IJ SOAJ injection Inject 0.3 mg into the muscle as needed. For severe allergy     fluticasone (FLONASE) 50 MCG/ACT nasal spray Place 2 sprays into the nose daily as needed.     hydrOXYzine (ATARAX) 25 MG tablet Take 1 tablet (25 mg total) by mouth daily as needed for anxiety. 90 tablet 0   lidocaine-prilocaine (EMLA) cream Apply to affected area once 30 g 3   LORazepam (ATIVAN) 0.5 MG tablet Take 1 tablet (0.5 mg total) by mouth every 6 (six) hours as needed (Nausea or vomiting). 30 tablet 0   omalizumab (XOLAIR) 150 MG injection Inject 300 mg into the skin every 6 (six) weeks. Dues sept 2     ondansetron (ZOFRAN) 8 MG tablet Take 1 tablet (8 mg total) by mouth 2 (two) times daily as needed for refractory nausea / vomiting. Start on day 3 after chemo. 30 tablet 1   prazosin (MINIPRESS) 2 MG capsule Take 1 capsule (2 mg total) by mouth at bedtime. 90 capsule 0   prochlorperazine (COMPAZINE) 10 MG tablet Take 1 tablet (10 mg total) by mouth every 6 (six) hours as needed (Nausea or vomiting). 30 tablet 1   sertraline (ZOLOFT) 100 MG tablet Take 2 tablets (200 mg total) by mouth daily. 180 tablet 1   traMADol (ULTRAM) 50 MG tablet Take 100 mg by mouth every 6 (six) hours as needed.     No current facility-administered medications for this visit.    REVIEW OF SYSTEMS:   Constitutional: Denies fevers, chills or abnormal night sweats Eyes: Denies blurriness of vision, double vision or watery eyes Ears, nose, mouth, throat, and face: Denies mucositis or sore throat Respiratory: Denies cough, dyspnea or wheezes Cardiovascular: Denies palpitation, chest discomfort or lower extremity swelling Gastrointestinal:  Denies nausea, heartburn or change in bowel habits Skin: Denies abnormal skin rashes Lymphatics: Denies new lymphadenopathy or easy bruising Neurological:Denies numbness, tingling or new  weaknesses Behavioral/Psych: Mood is stable, no new changes  Breast: Breast pain and lump in the left breast for the past 6 months. All other systems were reviewed with the patient and are negative.  PHYSICAL EXAMINATION: ECOG PERFORMANCE STATUS: 1 - Symptomatic but completely ambulatory  Vitals:   12/31/21 1557  BP: (!) 125/52  Pulse: 72  Resp: 16  Temp: 97.7 F (36.5 C)  SpO2: 96%   Filed Weights   12/31/21 1557  Weight: 211 lb 12.8 oz (96.1 kg)    GENERAL:alert, no distress and comfortable SKIN: skin color, texture, turgor are normal, no rashes or significant lesions EYES: normal, conjunctiva are pink and non-injected, sclera clear OROPHARYNX:no exudate, no erythema and lips, buccal mucosa, and tongue normal  NECK: supple, thyroid normal size, non-tender, without nodularity LYMPH:  no  palpable lymphadenopathy in the cervical, axillary or inguinal LUNGS: clear to auscultation and percussion with normal breathing effort HEART: regular rate & rhythm and no murmurs and no lower extremity edema ABDOMEN:abdomen soft, non-tender and normal bowel sounds Musculoskeletal:no cyanosis of digits and no clubbing  PSYCH: alert & oriented x 3 with fluent speech NEURO: no focal motor/sensory deficits BREAST: Left breast mass in the upper inner quadrant measuring about 3 cm in largest dimension, no palpable regional adenopathy.  The mass is mobile within the breast and the skin is pinchable.  LABORATORY DATA:  I have reviewed the data as listed Lab Results  Component Value Date   WBC 8.0 07/20/2020   HGB 10.3 (L) 07/20/2020   HCT 36.0 07/20/2020   MCV 77.3 (L) 07/20/2020   PLT 517 (H) 07/20/2020   Lab Results  Component Value Date   NA 140 07/20/2020   K 2.9 (L) 07/20/2020   CL 102 07/20/2020   CO2 24 07/20/2020    RADIOGRAPHIC STUDIES: I have personally reviewed the radiological reports and agreed with the findings in the report.  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  We have discussed about the pathology and prognostics in detail today.  Given tumor size close to 3 cm, node-negative and possible functional triple negative breast cancer, we have discussed about neoadjuvant chemotherapy with TC, docetaxel and cyclophosphamide for 4 cycles.  I discussed about mechanism of action of chemotherapy, adverse effects of chemotherapy including but not limited to fatigue, nausea, vomiting, diarrhea, alopecia, neuropathy, cytopenias and increased risk of infections.  We have discussed about pegfilgrastim of filgrastim for growth factor support to reduce her risk of infections. Post chemotherapy, she will proceed with surgery and adjuvant radiation followed by adjuvant antiestrogen therapy. Given PR negative and weak ER positivity, it is likely that she will have a good response.  She understands that there is no survival benefit of giving chemotherapy neoadjuvantly or adjuvantly, the only benefit could be change in the surgical outcomes in this scenario. She is agreeable to all the recommendations.  Chemo teach will have to be scheduled, chemotherapy anticipate in the next 1 week.  We have discussed about port placement but she is okay to proceed with peripheral IV infusion since it is only 4 treatments. Return to clinic in 7 days to 10 days, prefer MD visit, labs and chemotherapy on the same day.  All questions were answered. The patient knows to call the clinic with any problems, questions or concerns.    Benay Pike, MD 12/31/21

## 2021-12-31 NOTE — Progress Notes (Signed)
START ON PATHWAY REGIMEN - Breast     A cycle is every 21 days:     Docetaxel      Cyclophosphamide   **Always confirm dose/schedule in your pharmacy ordering system**  Patient Characteristics: Preoperative or Nonsurgical Candidate (Clinical Staging), Neoadjuvant Therapy followed by Surgery, Invasive Disease, Chemotherapy, HER2 Negative/Unknown/Equivocal, ER Positive Therapeutic Status: Preoperative or Nonsurgical Candidate (Clinical Staging) AJCC M Category: Staged < 8th Ed. AJCC Grade: Staged < 8th Ed. Breast Surgical Plan: Neoadjuvant Therapy followed by Surgery ER Status: Positive (+) AJCC 8 Stage Grouping: Staged < 8th Ed. HER2 Status: Negative (-) AJCC T Category: Staged < 8th Ed. AJCC N Category: Staged < 8th Ed. PR Status: Negative (-) Intent of Therapy: Curative Intent, Discussed with Patient

## 2022-01-01 ENCOUNTER — Telehealth: Payer: Self-pay

## 2022-01-01 ENCOUNTER — Encounter: Payer: Self-pay | Admitting: Physical Therapy

## 2022-01-01 ENCOUNTER — Telehealth: Payer: Self-pay | Admitting: Hematology and Oncology

## 2022-01-01 ENCOUNTER — Telehealth: Payer: Self-pay | Admitting: *Deleted

## 2022-01-01 ENCOUNTER — Ambulatory Visit: Payer: 59 | Attending: General Surgery | Admitting: Physical Therapy

## 2022-01-01 ENCOUNTER — Encounter: Payer: Self-pay | Admitting: *Deleted

## 2022-01-01 DIAGNOSIS — C50212 Malignant neoplasm of upper-inner quadrant of left female breast: Secondary | ICD-10-CM | POA: Diagnosis present

## 2022-01-01 DIAGNOSIS — Z171 Estrogen receptor negative status [ER-]: Secondary | ICD-10-CM | POA: Diagnosis present

## 2022-01-01 DIAGNOSIS — R293 Abnormal posture: Secondary | ICD-10-CM | POA: Diagnosis not present

## 2022-01-01 NOTE — Telephone Encounter (Signed)
DCP-001: Use of a Clinical Trial Screening Tool to Address Cancer Health Disparities in the [facility])   Rec'd a referral for this study from Dr Chryl Heck.   Called pt and introduced the study, including that it is voluntary. Emailed consents to her at the email on file (confirmed that it is correct). Plan to meet after her chemo education tomorrow afternoon to answer questions/potentially sign consents.  Marjie Skiff Payton Moder, RN, BSN, [facility] She   Her   [place] Clinical Research Nurse [facility] Direct Dial [phone]   Pager [phone] 01/01/2022 1:47 PM

## 2022-01-01 NOTE — Progress Notes (Signed)
The following biosimilar Ziextenzo (pegfilgrastim-bmez) has been selected for use in this patient.  Kennith Center, Pharm.D., CPP 01/01/2022@1 :54 PM

## 2022-01-01 NOTE — Therapy (Signed)
OUTPATIENT PHYSICAL THERAPY BREAST CANCER BASELINE EVALUATION   Patient Name: Maria Diaz MRN: [id number] DOB:03/27/1971, 51 y.o., female Today's Date: 01/01/2022   PT End of Session - 01/01/22 1550     Visit Number 1    Number of Visits 2    Date for PT Re-Evaluation 03/12/22    PT Start Time 1506    PT Stop Time 1545    PT Time Calculation (min) 39 min    Activity Tolerance Patient tolerated treatment well    Behavior During Therapy [facility] for tasks assessed/performed             Past Medical History:  Diagnosis Date   Anemia    Anxiety    Depression    Glaucoma    Past Surgical History:  Procedure Laterality Date   BREAST CYST EXCISION Left 01/28/2021   Procedure: LEFT BREAST MASS EXCISION;  Surgeon: Rolm Bookbinder, MD;  Location: [place];  Service: General;  Laterality: Left;  START TIME OF 3:00 PM FOR 60 MINUTES IN ROOM 8   HYSTEROSCOPY W/ ENDOMETRIAL ABLATION  08/14/2020   [facility]   Patient Active Problem List   Diagnosis Date Noted   Anemia 12/31/2021   Malignant neoplasm of upper-inner quadrant of left breast in female, estrogen receptor positive ([place]) 12/31/2021   Open angle with borderline findings and low glaucoma risk in both eyes 01/11/2016   Chronic idiopathic urticaria 01/11/2016   Age-related nuclear cataract of both eyes 01/11/2016   Eczema 01/11/2016    PCP: [name], MD  REFERRING PROVIDER: Rolm Bookbinder, MD  REFERRING DIAG: [phone] (ICD-10-CM) - Malignant neoplasm of upper-inner quadrant of left female breast   THERAPY DIAG:  Abnormal posture  Malignant neoplasm of upper-inner quadrant of left breast in female, estrogen receptor negative ([place])  ONSET DATE: 12/09/21  SUBJECTIVE                                                                                                                                                                                           SUBJECTIVE STATEMENT: Patient reports she is  here today to be seen by her medical team for her newly diagnosed left breast cancer.   PERTINENT HISTORY:  Patient was diagnosed on 12/09/21 with left grade 3. It measures 3 cm and is located in the upper inner quadrant. 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   PATIENT GOALS   reduce lymphedema risk and learn post op HEP.   PAIN:  Are you having pain? Yes NPRS scale: 7/10 Pain location: upper inner L breast Pain orientation: Medial and Upper  PAIN TYPE: aching and dull Pain description: constant  Aggravating factors: lying down at night Relieving factors: nothing  PRECAUTIONS: Active CA None  HAND DOMINANCE: right  WEIGHT BEARING RESTRICTIONS No  FALLS:  Has patient fallen in last 6 months? 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  LEISURE: pt reports she occasionally does sit ups, legs lifts, jumping jacks about once a week  PRIOR LEVEL OF FUNCTION: Independent   OBJECTIVE  COGNITION:  Overall cognitive status: Within functional limits for tasks assessed    POSTURE:  Forward head and rounded shoulders posture  UPPER EXTREMITY AROM/PROM:  A/PROM RIGHT  01/01/2022   Shoulder extension 61  Shoulder flexion 164  Shoulder abduction 173  Shoulder internal rotation 55  Shoulder external rotation 90    (Blank rows = not tested)  A/PROM LEFT  01/01/2022  Shoulder extension 63  Shoulder flexion 165  Shoulder abduction 180  Shoulder internal rotation 68  Shoulder external rotation 88    (Blank rows = not tested)    UPPER EXTREMITY STRENGTH: WFL grossly 5/5   LYMPHEDEMA ASSESSMENTS:   LANDMARK RIGHT  01/01/2022  10 cm proximal to olecranon process 36.5  Olecranon process 30.5  10 cm proximal to ulnar styloid process 23.5  Just proximal to ulnar styloid process 18.1  Across hand at thumb web space 20.2   At base of 2nd digit 6.8  (Blank rows = not tested)  LANDMARK LEFT  01/01/2022  10 cm proximal to olecranon process 36.5  Olecranon process 31.1  10 cm proximal to ulnar styloid process 24.2  Just proximal to ulnar styloid process 18.7  Across hand at thumb web space 20.8  At base of 2nd digit 6.9  (Blank rows = not tested)   L-DEX LYMPHEDEMA SCREENING:  The patient was assessed using the L-Dex machine today to produce a lymphedema index baseline score. The patient will be reassessed on a regular basis (typically every 3 months) to obtain new L-Dex scores. If the score is > 6.5 points away from his/her baseline score indicating onset of subclinical lymphedema, it will be recommended to wear a compression garment for 4 weeks, 12 hours per day and then be reassessed. If the score continues to be > 6.5 points from baseline at reassessment, we will initiate lymphedema treatment. Assessing in this manner has a 95% rate of preventing clinically significant lymphedema.   L-DEX FLOWSHEETS - 01/01/22 1500       L-DEX LYMPHEDEMA SCREENING   Measurement Type Unilateral    L-DEX MEASUREMENT EXTREMITY Upper Extremity    POSITION  Standing    DOMINANT SIDE Right    At Risk Side Left    BASELINE SCORE (UNILATERAL) 1.3              QUICK DASH SURVEY:   PATIENT EDUCATION:  Education details: Lymphedema risk reduction and post op shoulder/posture HEP Person educated: Patient Education method: Explanation, Demonstration, Handout Education comprehension: Patient verbalized understanding and returned demonstration   HOME EXERCISE PROGRAM: Patient was instructed today in a home exercise program today for post op shoulder range of motion. These included active assist shoulder flexion in sitting, scapular retraction, wall walking with shoulder abduction, and hands behind head external rotation.  She was encouraged to do these twice a day, holding 3 seconds and repeating 5 times when permitted  by her physician.   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 Pt will undergo neoadjuvant chemo and then undergo a L lumpectomy and SLNB followed by radiation. She will benefit from a post op PT reassessment to determine needs and from L-Dex screens every 3 months for 2 years to detect subclinical lymphedema.  Pt will benefit from skilled therapeutic intervention to improve on the following deficits: Decreased knowledge of precautions, impaired UE functional use, pain, decreased ROM, postural dysfunction.   PT treatment/interventions: ADL/self-care home management, pt/family education, therapeutic exercise  REHAB POTENTIAL: Good  CLINICAL DECISION MAKING: Stable/uncomplicated  EVALUATION COMPLEXITY: Low   GOALS: Goals reviewed with patient? YES  LONG TERM GOALS: (STG=LTG)   Name Target Date Goal status  1 Pt will be able to verbalize understanding of pertinent lymphedema risk reduction practices relevant to her dx specifically related to skin care.  Baseline:  No knowledge 01/01/2022 Achieved at eval  2 Pt will be able to return demo and/or verbalize understanding of the post op HEP related to regaining shoulder ROM. Baseline:  No knowledge 01/01/2022 Achieved at eval  3 Pt will be able to verbalize understanding of the importance of attending the post op After Breast CA Class for further lymphedema risk reduction education and therapeutic exercise.  Baseline:  No knowledge 01/01/2022 Achieved at eval  4 Pt will demo she has regained full shoulder ROM and function post operatively compared to baselines.  Baseline: See objective measurements taken today. 03/12/22      PLAN: PT FREQUENCY/DURATION: EVAL and 1 follow up appointment.   PLAN FOR NEXT SESSION: will reassess 3-4 weeks post op to determine needs.   Patient will follow up at outpatient cancer rehab 3-4 weeks following surgery.  If the  patient requires physical therapy at that time, a specific plan will be dictated and sent to the referring physician for approval. The patient was educated today on appropriate basic range of motion exercises to begin post operatively and the importance of attending the After Breast Cancer class following surgery.  Patient was educated today on lymphedema risk reduction practices as it pertains to recommendations that will benefit the patient immediately following surgery.  She verbalized good understanding.    Physical Therapy Information for After Breast Cancer Surgery/Treatment:  Lymphedema is a swelling condition that you may be at risk for in your arm if you have lymph nodes removed from the armpit area.  After a sentinel node biopsy, the risk is approximately 5-9% and is higher after an axillary node dissection.  There is treatment available for this condition and it is not life-threatening.  Contact your physician or physical therapist with concerns. You may begin the 4 shoulder/posture exercises (see additional sheet) when permitted by your physician (typically a week after surgery).  If you have drains, you may need to wait until those are removed before beginning range of motion exercises.  A general recommendation is to not lift your arms above shoulder height until drains are removed.  These exercises should be done to your tolerance and gently.  This is not a "no pain/no gain" type of recovery so listen to your body and stretch into the range of motion that you can tolerate, stopping if you have pain.  If you are having immediate reconstruction, ask your plastic surgeon about doing exercises as he or she may want you to wait. We encourage you to attend the free one time ABC (After Breast Cancer) class offered by [facility]  Outpatient Cancer Rehab.  You will learn information related to lymphedema risk, prevention and treatment and additional exercises to regain mobility following surgery.  You  can call [phone] for more information.  This is offered the 1st and 3rd Monday of each month.  You only attend the class one time. While undergoing any medical procedure or treatment, try to avoid blood pressure being taken or needle sticks from occurring on the arm on the side of cancer.   This recommendation begins after surgery and continues for the rest of your life.  This may help reduce your risk of getting lymphedema (swelling in your arm). An excellent resource for those seeking information on lymphedema is the National Lymphedema Network's web site. It can be accessed at [place].org If you notice swelling in your hand, arm or breast at any time following surgery (even if it is many years from now), please contact your doctor or physical therapist to discuss this.  Lymphedema can be treated at any time but it is easier for you if it is treated early on.  If you feel like your shoulder motion is not returning to normal in a reasonable amount of time, please contact your surgeon or physical therapist.  [facility] [phone]. [address], [place] [address]  ABC CLASS After Breast Cancer Class  After Breast Cancer Class is a specially designed exercise class to assist you in a safe recover after having breast cancer surgery.  In this class you will learn how to get back to full function whether your drains were just removed or if you had surgery a month ago.  This one-time class is held the 1st and 3rd Monday of every month from 11:00 a.m. until 12:00 noon virtually.  This class is FREE and space is limited. For more information or to register for the next available class, call [phone].  Class Goals  Understand specific stretches to improve the flexibility of you chest and shoulder. Learn ways to safely strengthen your upper body and improve your posture. Understand the warning signs of infection and why you may be at risk  for an arm infection. Learn about Lymphedema and prevention.  ** You do not attend this class until after surgery.  Drains must be removed to participate  Patient was instructed today in a home exercise program today for post op shoulder range of motion. These included active assist shoulder flexion in sitting, scapular retraction, wall walking with shoulder abduction, and hands behind head external rotation.  She was encouraged to do these twice a day, holding 3 seconds and repeating 5 times when permitted by her physician.    [facility], PT 01/01/2022, 3:55 PM

## 2022-01-01 NOTE — Telephone Encounter (Signed)
[id number] - TREATMENT OF REFRACTORY NAUSEA    Rec'd a referral for this study from Dr Chryl Heck.   Called pt and introduced the study, including that it is voluntary. Emailed consents to her at the email on file (confirmed that it is correct). Plan to meet after her chemo education tomorrow afternoon to answer questions/potentially sign consents.  Marjie Skiff Ramonda Galyon, RN, BSN, [facility] She   Her   Hers Clinical Research Nurse [facility] Direct Dial [phone]   Pager [phone] 01/01/2022 1:46 PM

## 2022-01-01 NOTE — Telephone Encounter (Signed)
Scheduled appointment per 02/08. Patient aware. Patient will also come to scheduling and get a calender of future appointments and discuss any changes if needed.

## 2022-01-01 NOTE — Research (Signed)
Exact 2021-05 Research Note:  Rec'd referral from Dr Chryl Heck. Pt does not have enough tissue to enroll in this study. She is still under consideration for other studies.  Marjie Skiff Shaeleigh Graw, RN, BSN, [facility] She   Her   Hers Clinical Research Nurse [facility] Direct Dial [phone]   Pager [phone] 01/01/2022 11:49 AM

## 2022-01-01 NOTE — Telephone Encounter (Signed)
Spoke to pt regarding tx care plan and dx. Denies questions or concerns. Discussed chemo regimen timing as well as injections.  Provided contact information for questions on needs. No further needs voiced.

## 2022-01-02 ENCOUNTER — Inpatient Hospital Stay: Payer: 59

## 2022-01-02 ENCOUNTER — Telehealth: Payer: Self-pay | Admitting: *Deleted

## 2022-01-02 ENCOUNTER — Encounter: Payer: Self-pay | Admitting: *Deleted

## 2022-01-02 ENCOUNTER — Other Ambulatory Visit: Payer: Self-pay

## 2022-01-02 DIAGNOSIS — Z170 Estrogen receptor positive status [ER+]: Secondary | ICD-10-CM

## 2022-01-02 DIAGNOSIS — C50212 Malignant neoplasm of upper-inner quadrant of left female breast: Secondary | ICD-10-CM

## 2022-01-02 NOTE — Telephone Encounter (Signed)
Called pt and discussed MRI results and recommendations from Dr. Donne Hazel. Received verbal understanding. Informed pt [facility] will call with appts for bil [place] and left US/bx. Denies further questions or concerns at this time.

## 2022-01-02 NOTE — Research (Signed)
[id number] - TREATMENT OF REFRACTORY NAUSEA   Patient Maria Diaz was identified by Dr Chryl Heck as a potential candidate for the above listed study.  This Clinical Research Nurse met with Maria Diaz, [id number], on 01/02/22 in a manner and location that ensures patient privacy to discuss participation in the above listed research study.  Patient is Accompanied by support person .  A copy of the informed consent document and separate HIPAA Authorization was provided to the patient.  Patient reads, speaks, and understands [place].   Patient was provided with the business card of this Nurse and encouraged to contact the research team with any questions.  Approximately 30 minutes were spent with the patient reviewing the informed consent documents.  Patient was provided the option of taking informed consent documents home to review and was encouraged to review at their convenience with their support network, including other care providers. Patient took the consent documents home to review.  Ms Cumpton is going to think about study participation. She is due to start chemotherapy 01/08/21. I provided her with my phone number and email address and encouraged her to reach out with any questions or concerns. I will follow up with her on Tuesday 2/14 if I haven't heard from her by then.  Marjie Skiff Dera Vanaken, RN, BSN, [facility] She   Her   Hers Clinical Research Nurse [facility] Direct Dial [phone]   Pager [phone] 01/02/2022 4:31 PM

## 2022-01-02 NOTE — Research (Signed)
Trial:  DCP-001: Use of a Clinical Trial Screening Tool to Address Cancer Health Disparities in the [place] Program [facility]) Patient Maria Diaz was identified by this RN as a potential candidate for the above listed study.  This Clinical Research Nurse met with Maria Diaz, [id number], on 01/02/22 in a manner and location that ensures patient privacy to discuss participation in the above listed research study.  Patient is Accompanied by her support person .  A copy of the informed consent document and separate HIPAA Authorization was provided to the patient.  Patient reads, speaks, and understands [place].   Patient was provided with the business card of this Nurse and encouraged to contact the research team with any questions.  Approximately 30 minutes were spent with the patient reviewing the informed consent documents.  Patient was provided the option of taking informed consent documents home to review and was encouraged to review at their convenience with their support network, including other care providers. Patient took the consent documents home to review. We discussed the type of information that would be collected if she decides to participate and covered the role of HIPAA and information security in the study. Ensured that patient has my number and email in case of questions. Plan to reach out 01/06/22 to follow up, if I haven't heard from her by then.  Marjie Skiff Salwa Bai, RN, BSN, [facility] She   Her   Hers Clinical Research Nurse [place] [phone]   Pager [phone] 01/02/2022 4:34 PM

## 2022-01-05 ENCOUNTER — Encounter: Payer: Self-pay | Admitting: *Deleted

## 2022-01-06 ENCOUNTER — Telehealth: Payer: Self-pay

## 2022-01-06 NOTE — Telephone Encounter (Signed)
[id number] - TREATMENT OF REFRACTORY NAUSEA  Attempted to reach Ms Skluzacek to follow up on our visit Friday 01/02/22. No answer, and no option to leave a voicemail.  Marjie Skiff Arriyana Rodell, RN, BSN, [facility] She   Her   Hers Clinical Research Nurse [place] [phone]   Pager [phone] 01/06/2022 11:39 AM

## 2022-01-07 ENCOUNTER — Telehealth: Payer: Self-pay

## 2022-01-07 MED FILL — Dexamethasone Sodium Phosphate Inj 100 MG/10ML: INTRAMUSCULAR | Qty: 1 | Status: AC

## 2022-01-07 NOTE — Telephone Encounter (Signed)
[id number] - TREATMENT OF REFRACTORY NAUSEA  Spoke with Ms Smithers. She does not wish to pursue study enrollment for this study.  Marjie Skiff Ramiah Helfrich, RN, BSN, [facility] She   Her   Hers Clinical Research Nurse [place] [phone]   Pager [phone] 01/07/2022 9:28 AM

## 2022-01-07 NOTE — Telephone Encounter (Signed)
DCP-001: Use of a Clinical Trial Screening Tool to Address Cancer Health Disparities in the [place] Program ([place])   Spoke with Ms Riquelme. She is not ready to make a decision on enrollment in the DCP study. Made a plan with her to see her in chemo tomorrow to follow up.  Marjie Skiff Lizania Bouchard, RN, BSN, [facility] She   Her   Hers Clinical Research Nurse [facility] Direct Dial [phone]   Pager [phone] 01/07/2022 9:29 AM

## 2022-01-08 ENCOUNTER — Inpatient Hospital Stay: Payer: 59

## 2022-01-08 ENCOUNTER — Encounter: Payer: Self-pay | Admitting: Hematology and Oncology

## 2022-01-08 ENCOUNTER — Encounter: Payer: Self-pay | Admitting: *Deleted

## 2022-01-08 ENCOUNTER — Inpatient Hospital Stay: Payer: 59 | Admitting: Hematology and Oncology

## 2022-01-08 ENCOUNTER — Other Ambulatory Visit: Payer: Self-pay

## 2022-01-08 ENCOUNTER — Ambulatory Visit (INDEPENDENT_AMBULATORY_CARE_PROVIDER_SITE_OTHER): Payer: 59 | Admitting: Psychiatry

## 2022-01-08 VITALS — BP 139/72 | HR 78 | Temp 98.9°F | Resp 16

## 2022-01-08 DIAGNOSIS — C50212 Malignant neoplasm of upper-inner quadrant of left female breast: Secondary | ICD-10-CM | POA: Diagnosis not present

## 2022-01-08 DIAGNOSIS — F4310 Post-traumatic stress disorder, unspecified: Secondary | ICD-10-CM | POA: Diagnosis not present

## 2022-01-08 DIAGNOSIS — F330 Major depressive disorder, recurrent, mild: Secondary | ICD-10-CM | POA: Diagnosis not present

## 2022-01-08 DIAGNOSIS — Z170 Estrogen receptor positive status [ER+]: Secondary | ICD-10-CM

## 2022-01-08 DIAGNOSIS — Z5111 Encounter for antineoplastic chemotherapy: Secondary | ICD-10-CM | POA: Diagnosis not present

## 2022-01-08 LAB — CMP (CANCER CENTER ONLY)
ALT: 7 U/L (ref 0–44)
AST: 11 U/L — ABNORMAL LOW (ref 15–41)
Albumin: 4.4 g/dL (ref 3.5–5.0)
Alkaline Phosphatase: 58 U/L (ref 38–126)
Anion gap: 8 (ref 5–15)
BUN: 13 mg/dL (ref 6–20)
CO2: 26 mmol/L (ref 22–32)
Calcium: 9.6 mg/dL (ref 8.9–10.3)
Chloride: 105 mmol/L (ref 98–111)
Creatinine: 0.73 mg/dL (ref 0.44–1.00)
GFR, Estimated: >60 mL/min (ref >60)
Glucose, Bld: 109 mg/dL — ABNORMAL HIGH (ref 70–99)
Potassium: 3.3 mmol/L — ABNORMAL LOW (ref 3.5–5.1)
Sodium: 139 mmol/L (ref 135–145)
Total Bilirubin: 0.4 mg/dL (ref 0.3–1.2)
Total Protein: 7.8 g/dL (ref 6.5–8.1)

## 2022-01-08 LAB — CBC WITH DIFFERENTIAL (CANCER CENTER ONLY)
Abs Immature Granulocytes: 0.02 10*3/uL (ref 0.00–0.07)
Basophils Absolute: 0 10*3/uL (ref 0.0–0.1)
Basophils Relative: 0 %
Eosinophils Absolute: 0 10*3/uL (ref 0.0–0.5)
Eosinophils Relative: 0 %
HCT: 41.3 % (ref 36.0–46.0)
Hemoglobin: 13.5 g/dL (ref 12.0–15.0)
Immature Granulocytes: 0 %
Lymphocytes Relative: 20 %
Lymphs Abs: 2.1 10*3/uL (ref 0.7–4.0)
MCH: 30.7 pg (ref 26.0–34.0)
MCHC: 32.7 g/dL (ref 30.0–36.0)
MCV: 93.9 fL (ref 80.0–100.0)
Monocytes Absolute: 0.7 10*3/uL (ref 0.1–1.0)
Monocytes Relative: 6 %
Neutro Abs: 7.7 10*3/uL (ref 1.7–7.7)
Neutrophils Relative %: 74 %
Platelet Count: 382 10*3/uL (ref 150–400)
RBC: 4.4 MIL/uL (ref 3.87–5.11)
RDW: 13.3 % (ref 11.5–15.5)
WBC Count: 10.4 10*3/uL (ref 4.0–10.5)
nRBC: 0 % (ref 0.0–0.2)

## 2022-01-08 MED ORDER — SODIUM CHLORIDE 0.9 % IV SOLN
75.0000 mg/m2 | Freq: Once | INTRAVENOUS | Status: AC
  Administered 2022-01-08: 160 mg via INTRAVENOUS
  Filled 2022-01-08: qty 16

## 2022-01-08 MED ORDER — SODIUM CHLORIDE 0.9 % IV SOLN
600.0000 mg/m2 | Freq: Once | INTRAVENOUS | Status: AC
  Administered 2022-01-08: 1280 mg via INTRAVENOUS
  Filled 2022-01-08: qty 64

## 2022-01-08 MED ORDER — SODIUM CHLORIDE 0.9 % IV SOLN: Freq: Once | INTRAVENOUS | Status: AC

## 2022-01-08 MED ORDER — SODIUM CHLORIDE 0.9 % IV SOLN
10.0000 mg | Freq: Once | INTRAVENOUS | Status: AC
  Administered 2022-01-08: 10 mg via INTRAVENOUS
  Filled 2022-01-08: qty 10

## 2022-01-08 MED ORDER — PALONOSETRON HCL INJECTION 0.25 MG/5ML
0.2500 mg | Freq: Once | INTRAVENOUS | Status: AC
  Administered 2022-01-08: 0.25 mg via INTRAVENOUS
  Filled 2022-01-08: qty 5

## 2022-01-08 NOTE — Assessment & Plan Note (Signed)
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  Given tumor size close to 3 cm, we have discussed about TC neoadjuvantly.  She is here for first cycle.  She denies any new health complaints since last visit except for progressive increase in size of the tumor. Physical examination left breast upper inner quadrant mass palpable close to 3 to 3.5 cm in largest dimension. Okay to proceed with chemotherapy if labs are all within parameters. She will return to clinic in 3 weeks before cycle 2-day 1.  We once again discussed about anticipated adverse effects including possible allergic reactions, nausea, vomiting, diarrhea, increased risk of infections, neuropathy and bone aches from Neulasta. She was instructed to call us with any new questions or concerns.

## 2022-01-08 NOTE — Patient Instructions (Signed)
[facility] ONCOLOGY  Discharge Instructions: Thank you for choosing [facility] to provide your oncology and hematology care.   If you have a lab appointment with the [facility], please go directly to the [facility] and check in at the registration area.   Wear comfortable clothing and clothing appropriate for easy access to any Portacath or PICC line.   We strive to give you quality time with your provider. You may need to reschedule your appointment if you arrive late (15 or more minutes).  Arriving late affects you and other patients whose appointments are after yours.  Also, if you miss three or more appointments without notifying the office, you may be dismissed from the clinic at the providers discretion.      For prescription refill requests, have your pharmacy contact our office and allow 72 hours for refills to be completed.    Today you received the following chemotherapy and/or immunotherapy agents: Docetaxel & Cyclophosphamide.      To help prevent nausea and vomiting after your treatment, we encourage you to take your nausea medication as directed.  BELOW ARE SYMPTOMS THAT SHOULD BE REPORTED IMMEDIATELY: *FEVER GREATER THAN 100.4 F (38 C) OR HIGHER *CHILLS OR SWEATING *NAUSEA AND VOMITING THAT IS NOT CONTROLLED WITH YOUR NAUSEA MEDICATION *UNUSUAL SHORTNESS OF BREATH *UNUSUAL BRUISING OR BLEEDING *URINARY PROBLEMS (pain or burning when urinating, or frequent urination) *BOWEL PROBLEMS (unusual diarrhea, constipation, pain near the anus) TENDERNESS IN MOUTH AND THROAT WITH OR WITHOUT PRESENCE OF ULCERS (sore throat, sores in mouth, or a toothache) UNUSUAL RASH, SWELLING OR PAIN  UNUSUAL VAGINAL DISCHARGE OR ITCHING   Items with * indicate a potential emergency and should be followed up as soon as possible or go to the Emergency Department if any problems should occur.  Please show the CHEMOTHERAPY ALERT CARD or IMMUNOTHERAPY ALERT  CARD at check-in to the Emergency Department and triage nurse.  Should you have questions after your visit or need to cancel or reschedule your appointment, please contact [facility]  Dept: [phone]  and follow the prompts.  Office hours are 8:00 a.m. to 4:30 p.m. Monday - Friday. Please note that voicemails left after 4:00 p.m. may not be returned until the following business day.  We are closed weekends and major holidays. You have access to a nurse at all times for urgent questions. Please call the main number to the clinic Dept: [phone] and follow the prompts.   For any non-urgent questions, you may also contact your provider using MyChart. We now offer e-Visits for anyone 36 and older to request care online for non-urgent symptoms. For details visit [email or web address].   Also download the MyChart app! Go to the app store, search "MyChart", open the app, select [facility], and log in with your MyChart username and password.  Due to Covid, a mask is required upon entering the hospital/clinic. If you do not have a mask, one will be given to you upon arrival. For doctor visits, patients may have 1 support person aged 47 or older with them. For treatment visits, patients cannot have anyone with them due to current Covid guidelines and our immunocompromised population.   Docetaxel injection What is this medication? DOCETAXEL (doe se TAX el) is a chemotherapy drug. It targets fast dividing cells, like cancer cells, and causes these cells to die. This medicine is used to treat many types of cancers like breast cancer, certain stomach cancers, head and  neck cancer, lung cancer, and prostate cancer. This medicine may be used for other purposes; ask your health care provider or pharmacist if you have questions. COMMON BRAND NAME(S): Docefrez, Taxotere What should I tell my care team before I take this medication? They need to know if you have any of these  conditions: infection (especially a virus infection such as chickenpox, cold sores, or herpes) liver disease low blood counts, like low white cell, platelet, or red cell counts an unusual or allergic reaction to docetaxel, polysorbate 80, other chemotherapy agents, other medicines, foods, dyes, or preservatives pregnant or trying to get pregnant breast-feeding How should I use this medication? This drug is given as an infusion into a vein. It is administered in a hospital or clinic by a specially trained health care professional. Talk to your pediatrician regarding the use of this medicine in children. Special care may be needed. Overdosage: If you think you have taken too much of this medicine contact a poison control center or emergency room at once. NOTE: This medicine is only for you. Do not share this medicine with others. What if I miss a dose? It is important not to miss your dose. Call your doctor or health care professional if you are unable to keep an appointment. What may interact with this medication? Do not take this medicine with any of the following medications: live virus vaccines This medicine may also interact with the following medications: aprepitant certain antibiotics like erythromycin or clarithromycin certain antivirals for HIV or hepatitis certain medicines for fungal infections like fluconazole, itraconazole, ketoconazole, posaconazole, or voriconazole cimetidine ciprofloxacin conivaptan cyclosporine dronedarone fluvoxamine grapefruit juice imatinib verapamil This list may not describe all possible interactions. Give your health care provider a list of all the medicines, herbs, non-prescription drugs, or dietary supplements you use. Also tell them if you smoke, drink alcohol, or use illegal drugs. Some items may interact with your medicine. What should I watch for while using this medication? Your condition will be monitored carefully while you are receiving  this medicine. You will need important blood work done while you are taking this medicine. Call your doctor or health care professional for advice if you get a fever, chills or sore throat, or other symptoms of a cold or flu. Do not treat yourself. This drug decreases your body's ability to fight infections. Try to avoid being around people who are sick. Some products may contain alcohol. Ask your health care professional if this medicine contains alcohol. Be sure to tell all health care professionals you are taking this medicine. Certain medicines, like metronidazole and disulfiram, can cause an unpleasant reaction when taken with alcohol. The reaction includes flushing, headache, nausea, vomiting, sweating, and increased thirst. The reaction can last from 30 minutes to several hours. You may get drowsy or dizzy. Do not drive, use machinery, or do anything that needs mental alertness until you know how this medicine affects you. Do not stand or sit up quickly, especially if you are an older patient. This reduces the risk of dizzy or fainting spells. Alcohol may interfere with the effect of this medicine. Talk to your health care professional about your risk of cancer. You may be more at risk for certain types of cancer if you take this medicine. Do not become pregnant while taking this medicine or for 6 months after stopping it. Women should inform their doctor if they wish to become pregnant or think they might be pregnant. There is a potential for serious side  effects to an unborn child. Talk to your health care professional or pharmacist for more information. Do not breast-feed an infant while taking this medicine or for 1 week after stopping it. Males who get this medicine must use a condom during sex with females who can get pregnant. If you get a woman pregnant, the baby could have birth defects. The baby could die before they are born. You will need to continue wearing a condom for 3 months after  stopping the medicine. Tell your health care provider right away if your partner becomes pregnant while you are taking this medicine. This may interfere with the ability to father a child. You should talk to your doctor or health care professional if you are concerned about your fertility. What side effects may I notice from receiving this medication? Side effects that you should report to your doctor or health care professional as soon as possible: allergic reactions like skin rash, itching or hives, swelling of the face, lips, or tongue blurred vision breathing problems changes in vision low blood counts - This drug may decrease the number of white blood cells, red blood cells and platelets. You may be at increased risk for infections and bleeding. nausea and vomiting pain, redness or irritation at site where injected pain, tingling, numbness in the hands or feet redness, blistering, peeling, or loosening of the skin, including inside the mouth signs of decreased platelets or bleeding - bruising, pinpoint red spots on the skin, black, tarry stools, nosebleeds signs of decreased red blood cells - unusually weak or tired, fainting spells, lightheadedness signs of infection - fever or chills, cough, sore throat, pain or difficulty passing urine swelling of the ankle, feet, hands Side effects that usually do not require medical attention (report to your doctor or health care professional if they continue or are bothersome): constipation diarrhea fingernail or toenail changes hair loss loss of appetite mouth sores muscle pain This list may not describe all possible side effects. Call your doctor for medical advice about side effects. You may report side effects to FDA at 1-800-FDA-1088. Where should I keep my medication? This drug is given in a hospital or clinic and will not be stored at home. NOTE: This sheet is a summary. It may not cover all possible information. If you have questions  about this medicine, talk to your doctor, pharmacist, or health care provider.  2022 Elsevier/Gold Standard (2021-07-29 00:00:00)  Cyclophosphamide Injection What is this medication? CYCLOPHOSPHAMIDE (sye kloe FOSS fa mide) is a chemotherapy drug. It slows the growth of cancer cells. This medicine is used to treat many types of cancer like lymphoma, myeloma, leukemia, breast cancer, and ovarian cancer, to name a few. This medicine may be used for other purposes; ask your health care provider or pharmacist if you have questions. COMMON BRAND NAME(S): Cytoxan, Neosar What should I tell my care team before I take this medication? They need to know if you have any of these conditions: heart disease history of irregular heartbeat infection kidney disease liver disease low blood counts, like white cells, platelets, or red blood cells on hemodialysis recent or ongoing radiation therapy scarring or thickening of the lungs trouble passing urine an unusual or allergic reaction to cyclophosphamide, other medicines, foods, dyes, or preservatives pregnant or trying to get pregnant breast-feeding How should I use this medication? This drug is usually given as an injection into a vein or muscle or by infusion into a vein. It is administered in a hospital or clinic  by a specially trained health care professional. Talk to your pediatrician regarding the use of this medicine in children. Special care may be needed. Overdosage: If you think you have taken too much of this medicine contact a poison control center or emergency room at once. NOTE: This medicine is only for you. Do not share this medicine with others. What if I miss a dose? It is important not to miss your dose. Call your doctor or health care professional if you are unable to keep an appointment. What may interact with this medication? amphotericin B azathioprine certain antivirals for HIV or hepatitis certain medicines for blood  pressure, heart disease, irregular heart beat certain medicines that treat or prevent blood clots like warfarin certain other medicines for cancer cyclosporine etanercept indomethacin medicines that relax muscles for surgery medicines to increase blood counts metronidazole This list may not describe all possible interactions. Give your health care provider a list of all the medicines, herbs, non-prescription drugs, or dietary supplements you use. Also tell them if you smoke, drink alcohol, or use illegal drugs. Some items may interact with your medicine. What should I watch for while using this medication? Your condition will be monitored carefully while you are receiving this medicine. You may need blood work done while you are taking this medicine. Drink water or other fluids as directed. Urinate often, even at night. Some products may contain alcohol. Ask your health care professional if this medicine contains alcohol. Be sure to tell all health care professionals you are taking this medicine. Certain medicines, like metronidazole and disulfiram, can cause an unpleasant reaction when taken with alcohol. The reaction includes flushing, headache, nausea, vomiting, sweating, and increased thirst. The reaction can last from 30 minutes to several hours. Do not become pregnant while taking this medicine or for 1 year after stopping it. Women should inform their health care professional if they wish to become pregnant or think they might be pregnant. Men should not father a child while taking this medicine and for 4 months after stopping it. There is potential for serious side effects to an unborn child. Talk to your health care professional for more information. Do not breast-feed an infant while taking this medicine or for 1 week after stopping it. This medicine has caused ovarian failure in some women. This medicine may make it more difficult to get pregnant. Talk to your health care professional if  you are concerned about your fertility. This medicine has caused decreased sperm counts in some men. This may make it more difficult to father a child. Talk to your health care professional if you are concerned about your fertility. Call your health care professional for advice if you get a fever, chills, or sore throat, or other symptoms of a cold or flu. Do not treat yourself. This medicine decreases your body's ability to fight infections. Try to avoid being around people who are sick. Avoid taking medicines that contain aspirin, acetaminophen, ibuprofen, naproxen, or ketoprofen unless instructed by your health care professional. These medicines may hide a fever. Talk to your health care professional about your risk of cancer. You may be more at risk for certain types of cancer if you take this medicine. If you are going to need surgery or other procedure, tell your health care professional that you are using this medicine. Be careful brushing or flossing your teeth or using a toothpick because you may get an infection or bleed more easily. If you have any dental work done, tell  your dentist you are receiving this medicine. What side effects may I notice from receiving this medication? Side effects that you should report to your doctor or health care professional as soon as possible: allergic reactions like skin rash, itching or hives, swelling of the face, lips, or tongue breathing problems nausea, vomiting signs and symptoms of bleeding such as bloody or black, tarry stools; red or dark brown urine; spitting up blood or brown material that looks like coffee grounds; red spots on the skin; unusual bruising or bleeding from the eyes, gums, or nose signs and symptoms of heart failure like fast, irregular heartbeat, sudden weight gain; swelling of the ankles, feet, hands signs and symptoms of infection like fever; chills; cough; sore throat; pain or trouble passing urine signs and symptoms of kidney  injury like trouble passing urine or change in the amount of urine signs and symptoms of liver injury like dark yellow or brown urine; general ill feeling or flu-like symptoms; light-colored stools; loss of appetite; nausea; right upper belly pain; unusually weak or tired; yellowing of the eyes or skin Side effects that usually do not require medical attention (report to your doctor or health care professional if they continue or are bothersome): confusion decreased hearing diarrhea facial flushing hair loss headache loss of appetite missed menstrual periods signs and symptoms of low red blood cells or anemia such as unusually weak or tired; feeling faint or lightheaded; falls skin discoloration This list may not describe all possible side effects. Call your doctor for medical advice about side effects. You may report side effects to FDA at 1-800-FDA-1088. Where should I keep my medication? This drug is given in a hospital or clinic and will not be stored at home. NOTE: This sheet is a summary. It may not cover all possible information. If you have questions about this medicine, talk to your doctor, pharmacist, or health care provider.  2022 Elsevier/Gold Standard (2021-07-29 00:00:00)
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
## 2022-01-08 NOTE — Research (Signed)
Trial Name:  [id number]: Use of a Clinical Trial Screening Tool to Address Cancer Health Disparities in the [facility])  Patient Maria Diaz was identified by research team as a potential candidate for the above listed study.  This Clinical Research Nurse met with ISRA LINDY, [id number] on 01/08/22 in a manner and location that ensures patient privacy to discuss participation in the above listed research study.  Patient is Accompanied by support person .  Patient was previously provided with informed consent documents.  Patient confirmed they have read the informed consent documents.  As outlined in the informed consent form, this Nurse and Lacretia Nicks Moren discussed the purpose of the research study, the investigational nature of the study, study procedures and requirements for study participation, potential risks and benefits of study participation, as well as alternatives to participation.  This study is not blinded or double-blinded. The patient understands participation is voluntary and they may withdraw from study participation at any time.  This study does not involve randomization.  This study does not involve an investigational drug or device. This study does not involve a placebo. Patient understands enrollment is pending full eligibility review.   Confidentiality and how the patient's information will be used as part of study participation were discussed.  Patient was informed there is not reimbursement provided for their time and effort spent on trial participation.  The patient is encouraged to discuss research study participation with their insurance provider to determine what costs they may incur as part of study participation, including research related injury.    All questions were answered to patient's satisfaction.  The informed consent and separate HIPAA Authorization was reviewed page by page.  The patient's mental and emotional status is  appropriate to provide informed consent, and the patient verbalizes an understanding of study participation.  Patient has agreed to participate in the above listed research study and has voluntarily signed the informed consent version dated 09/02/21 and separate HIPAA Authorization, version 5  on 01/08/22 at 1:30PM.  The patient was provided with a copy of the signed informed consent form and separate HIPAA Authorization for their reference.  No study specific procedures were obtained prior to the signing of the informed consent document.  Approximately 20 minutes were spent with the patient reviewing the informed consent documents.  Patient was not requested to complete a Release of Information form.  Reviewed DCP questionnaire; pt answered all questions. She was then taken to waiting room for Infusion, and Infusion team was notified of her arrival.  Marjie Skiff. Steward Sames, RN, BSN, [facility] She   Her   Hers Clinical Research Nurse [facility] Direct Dial [phone]   Pager [phone] 01/08/2022 1:46 PM`

## 2022-01-08 NOTE — Progress Notes (Signed)
[facility] CONSULT NOTE  Patient Care Team: [name], MD as PCP - General (Family Medicine) Mauro Kaufmann, RN as Oncology Nurse Navigator Rockwell Germany, RN as Oncology Nurse Navigator  CHIEF COMPLAINTS/PURPOSE OF CONSULTATION:  Newly diagnosed breast cancer  HISTORY OF PRESENTING ILLNESS:  Maria Diaz 51 y.o. female is here because of recent diagnosis of left breast cancer  Mammogram, 2.6 x 2 cm irregular mass with a spiculated and indistinct margin in the left breast at 10:00 posterior depth 10 cm from the nipple correlates to the palpated mass, there is architectural distortion associated with the mass, no other suspicious masses or calcifications seen in either breast. Initial pathology could not comment on the grade, prognostics were ER 10% weak, PR negative, Ki 67 25%, HER2 not performed due to block tissue exhausted.  She had a repeat biopsy which showed a grade 3 IDC, prognostics on this 1 were reported verbally as ER 40% weak, PR negative and HER2 negative KI of 30%.  Biopsy from the lymph node did not show any evidence of malignancy.  She is now referred to medical oncology for recommendations.  I reviewed her records extensively and collaborated the history with the patient.  SUMMARY OF ONCOLOGIC HISTORY: Oncology History  Malignant neoplasm of upper-inner quadrant of left breast in female, estrogen receptor positive ([place])  12/31/2021 Initial Diagnosis   Malignant neoplasm of upper-inner quadrant of left breast in female, estrogen receptor positive ([place])   12/31/2021 Cancer Staging   Staging form: Breast, AJCC 8th Edition - Clinical: cT2, cN0, G3, ER+, PR-, HER2- - Signed by Benay Pike, MD on 12/31/2021 Stage prefix: Initial diagnosis Histologic grading system: 3 grade system    01/08/2022 -  Chemotherapy   Patient is on Treatment Plan : BREAST TC q21d      Since last visit, she feels that the tumor has gotten bigger.  She denies any new  complaints.  She is ready for her cycle 1 day 1 of chemotherapy today.  She is not interested in participating in the nausea study.  She was considering the specimen collection study. Rest of the pertinent 10 point ROS reviewed and negative.  MEDICAL HISTORY:  Past Medical History:  Diagnosis Date   Anemia    Anxiety    Depression    Glaucoma     SURGICAL HISTORY: Past Surgical History:  Procedure Laterality Date   BREAST CYST EXCISION Left 01/28/2021   Procedure: LEFT BREAST MASS EXCISION;  Surgeon: Rolm Bookbinder, MD;  Location: [place];  Service: General;  Laterality: Left;  START TIME OF 3:00 PM FOR 60 MINUTES IN ROOM 8   HYSTEROSCOPY W/ ENDOMETRIAL ABLATION  08/14/2020   [facility]    SOCIAL HISTORY: Social History   Socioeconomic History   Marital status: Legally Separated    Spouse name: Not on file   Number of children: Not on file   Years of education: Not on file   Highest education level: Not on file  Occupational History   Not on file  Tobacco Use   Smoking status: Never   Smokeless tobacco: Never  Substance and Sexual Activity   Alcohol use: Yes    Alcohol/week: 1.0 standard drink    Types: 1 Glasses of wine per week    Comment: holidays   Drug use: Never   Sexual activity: Yes    Birth control/protection: None  Other Topics Concern   Not on file  Social History Narrative  Not on file   Social Determinants of Health   Financial Resource Strain: Not on file  Food Insecurity: Not on file  Transportation Needs: Not on file  Physical Activity: Not on file  Stress: Not on file  Social Connections: Not on file  Intimate Partner Violence: Not on file    FAMILY HISTORY: Family History  Problem Relation Age of Onset   Anxiety disorder Sister    Drug abuse Mother     ALLERGIES:  is allergic to haemophilus influenzae vaccines and penicillins.  MEDICATIONS:  Current Outpatient Medications  Medication Sig Dispense Refill    dexamethasone (DECADRON) 4 MG tablet Take 2 tablets (8 mg total) by mouth 2 (two) times daily. Start the day before Taxotere. Then again the day after chemo for 3 days. 30 tablet 1   EPINEPHrine 0.3 mg/0.3 mL IJ SOAJ injection Inject 0.3 mg into the muscle as needed. For severe allergy     fluticasone (FLONASE) 50 MCG/ACT nasal spray Place 2 sprays into the nose daily as needed.     hydrOXYzine (ATARAX) 25 MG tablet Take 1 tablet (25 mg total) by mouth daily as needed for anxiety. 90 tablet 0   lidocaine-prilocaine (EMLA) cream Apply to affected area once 30 g 3   LORazepam (ATIVAN) 0.5 MG tablet Take 1 tablet (0.5 mg total) by mouth every 6 (six) hours as needed (Nausea or vomiting). 30 tablet 0   omalizumab (XOLAIR) 150 MG injection Inject 300 mg into the skin every 6 (six) weeks. Dues sept 2     ondansetron (ZOFRAN) 8 MG tablet Take 1 tablet (8 mg total) by mouth 2 (two) times daily as needed for refractory nausea / vomiting. Start on day 3 after chemo. 30 tablet 1   prazosin (MINIPRESS) 2 MG capsule Take 1 capsule (2 mg total) by mouth at bedtime. 90 capsule 0   prochlorperazine (COMPAZINE) 10 MG tablet Take 1 tablet (10 mg total) by mouth every 6 (six) hours as needed (Nausea or vomiting). 30 tablet 1   sertraline (ZOLOFT) 100 MG tablet Take 2 tablets (200 mg total) by mouth daily. 180 tablet 1   traMADol (ULTRAM) 50 MG tablet Take 100 mg by mouth every 6 (six) hours as needed.     No current facility-administered medications for this visit.   Facility-Administered Medications Ordered in Other Visits  Medication Dose Route Frequency Provider Last Rate Last Admin   cyclophosphamide (CYTOXAN) 1,280 mg in sodium chloride 0.9 % 250 mL chemo infusion  600 mg/m2 (Treatment Plan Recorded) Intravenous Once Noble Bodie, Arletha Pili, MD       dexamethasone (DECADRON) 10 mg in sodium chloride 0.9 % 50 mL IVPB  10 mg Intravenous Once Samuele Storey, Arletha Pili, MD       DOCEtaxel (TAXOTERE) 160 mg in sodium chloride 0.9 %  250 mL chemo infusion  75 mg/m2 (Treatment Plan Recorded) Intravenous Once Shontavia Mickel, Arletha Pili, MD       palonosetron (ALOXI) injection 0.25 mg  0.25 mg Intravenous Once Zaidan Keeble, Arletha Pili, MD        REVIEW OF SYSTEMS:   Constitutional: Denies fevers, chills or abnormal night sweats Eyes: Denies blurriness of vision, double vision or watery eyes Ears, nose, mouth, throat, and face: Denies mucositis or sore throat Respiratory: Denies cough, dyspnea or wheezes Cardiovascular: Denies palpitation, chest discomfort or lower extremity swelling Gastrointestinal:  Denies nausea, heartburn or change in bowel habits Skin: Denies abnormal skin rashes Lymphatics: Denies new lymphadenopathy or easy bruising Neurological:Denies numbness, tingling or new weaknesses Behavioral/Psych:  Mood is stable, no new changes  Breast: Breast pain and lump in the left breast for the past 6 months getting larger. All other systems were reviewed with the patient and are negative.  PHYSICAL EXAMINATION: ECOG PERFORMANCE STATUS: 1 - Symptomatic but completely ambulatory  Vitals:   01/08/22 1307  BP: (!) 129/51  Pulse: 77  Resp: 16  Temp: 97.9 F (36.6 C)  SpO2: 98%    Filed Weights   01/08/22 1307  Weight: 210 lb 12.8 oz (95.6 kg)     GENERAL:alert, no distress and comfortable SKIN: skin color, texture, turgor are normal, no rashes or significant lesions EYES: normal, conjunctiva are pink and non-injected, sclera clear OROPHARYNX:no exudate, no erythema and lips, buccal mucosa, and tongue normal  NECK: supple, thyroid normal size, non-tender, without nodularity LYMPH:  no palpable lymphadenopathy in the cervical, axillary or inguinal LUNGS: clear to auscultation and percussion with normal breathing effort HEART: regular rate & rhythm and no murmurs and no lower extremity edema ABDOMEN:abdomen soft, non-tender and normal bowel sounds Musculoskeletal:no cyanosis of digits and no clubbing  PSYCH: alert &  oriented x 3 with fluent speech NEURO: no focal motor/sensory deficits BREAST: Left breast mass in the upper inner quadrant measuring about 3 to 3.5 cm in largest dimension, no palpable regional adenopathy.  The mass is mobile within the breast and the skin is pinchable.  LABORATORY DATA:  I have reviewed the data as listed Lab Results  Component Value Date   WBC 10.4 01/08/2022   HGB 13.5 01/08/2022   HCT 41.3 01/08/2022   MCV 93.9 01/08/2022   PLT 382 01/08/2022   Lab Results  Component Value Date   NA 139 01/08/2022   K 3.3 (L) 01/08/2022   CL 105 01/08/2022   CO2 26 01/08/2022    RADIOGRAPHIC STUDIES: I have personally reviewed the radiological reports and agreed with the findings in the report.  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  Given tumor size close to 3 cm, we have discussed about TC neoadjuvantly.  She is here for first cycle.  She denies any new health complaints since last visit except for progressive increase in size of the tumor. Physical examination left breast upper inner quadrant mass palpable close to 3 to 3.5 cm in largest dimension. Okay to proceed with chemotherapy if labs are all within parameters. She will return to clinic in 3 weeks before cycle 2-day 1.  We once again discussed about anticipated adverse effects including possible allergic reactions, nausea, vomiting, diarrhea, increased risk of infections, neuropathy and bone aches from Neulasta. She was instructed to call us with any new questions or concerns.  I spent 30 minutes  in the care of this patient including history, physical exam, review of records, counseling and coordination of care. All questions were answered. The patient knows to call the clinic with any problems, questions or concerns.    Benay Pike, MD 01/08/22

## 2022-01-09 ENCOUNTER — Telehealth: Payer: Self-pay | Admitting: *Deleted

## 2022-01-09 NOTE — Telephone Encounter (Signed)
Called pt to see how she did with her recent treatment.  She reports doing well but stomach feels like she may have some diarrhea.  Discussed imodium & diet.  Pt coming in tomorrow for injection.  Reminded about claritin/tylenol use.  Discussed cryotherapy with taxotere.  She reports not using ice with first treatment.  Encouraged for future treatments.  She reports knowing how to reach [place] with any concerns.

## 2022-01-09 NOTE — Telephone Encounter (Signed)
-----   Message from Derrick Ravel, RN sent at 01/08/2022  5:02 PM EST ----- Regarding: First time taxotere/cytoxan, Dr. Chryl Heck patient First time taxotere/cytoxan, Dr. Chryl Heck patient. She tolerated infusion well, please give follow up phone call tomorrow, thank you!

## 2022-01-10 ENCOUNTER — Inpatient Hospital Stay: Payer: 59

## 2022-01-10 ENCOUNTER — Other Ambulatory Visit: Payer: Self-pay

## 2022-01-10 VITALS — BP 118/67 | HR 91 | Temp 97.2°F | Resp 17

## 2022-01-10 DIAGNOSIS — Z170 Estrogen receptor positive status [ER+]: Secondary | ICD-10-CM

## 2022-01-10 DIAGNOSIS — Z5111 Encounter for antineoplastic chemotherapy: Secondary | ICD-10-CM | POA: Diagnosis not present

## 2022-01-10 MED ORDER — PEGFILGRASTIM-BMEZ 6 MG/0.6ML ~~LOC~~ SOSY
6.0000 mg | PREFILLED_SYRINGE | Freq: Once | SUBCUTANEOUS | Status: AC
  Administered 2022-01-10: 6 mg via SUBCUTANEOUS
  Filled 2022-01-10: qty 0.6

## 2022-01-10 NOTE — Patient Instructions (Signed)

## 2022-01-13 ENCOUNTER — Encounter: Payer: Self-pay | Admitting: Hematology and Oncology

## 2022-01-13 NOTE — Progress Notes (Signed)
Received voicemail from patient stating she received my card at check-in(per my request) at her last appointment.  Introduced myself as Arboriculturist and to offer available resources.  Discussed one-time $1000 Radio broadcast assistant to assist with personal expenses while going through treatment. Based on income guidelines, she states she exceeds limits.  Discussed [place] application which assists patients whom live in [place] and how to submit application by returning to them or send to me to email to them. Confirmed patient's email address and sent to her email per her request.  Discussed available copay assistance for Ziextenzo via [employer]. She gave me permission to apply on her behalf. Advised I would apply for her online. She verbalized understanding. She has my card for any additional financial questions or concerns.

## 2022-01-13 NOTE — Progress Notes (Signed)
Applied for copay assistance online via [employer] co-pay Program.  Patient approved for $10,000 01/13/22 - 01/13/23 with a lookback date of 09/12/21. She will have a $0 copay after insurance pays their portion.  A copy of the approval letter will be mailed to patient for her records only.  Copay information will be shared with Len[place] for billing/claim submissions.

## 2022-01-14 ENCOUNTER — Encounter: Payer: Self-pay | Admitting: *Deleted

## 2022-01-16 ENCOUNTER — Other Ambulatory Visit: Payer: Self-pay | Admitting: General Surgery

## 2022-01-16 DIAGNOSIS — N6320 Unspecified lump in the left breast, unspecified quadrant: Secondary | ICD-10-CM

## 2022-01-19 ENCOUNTER — Encounter: Payer: Self-pay | Admitting: *Deleted

## 2022-01-19 ENCOUNTER — Other Ambulatory Visit: Payer: Self-pay

## 2022-01-19 ENCOUNTER — Other Ambulatory Visit (HOSPITAL_COMMUNITY): Payer: Self-pay | Admitting: Diagnostic Radiology

## 2022-01-19 ENCOUNTER — Ambulatory Visit
Admission: RE | Admit: 2022-01-19 | Discharge: 2022-01-19 | Disposition: A | Discharge status: Home / Self Care | Payer: 59 | Source: Ambulatory Visit | Attending: General Surgery | Admitting: General Surgery

## 2022-01-19 DIAGNOSIS — N6320 Unspecified lump in the left breast, unspecified quadrant: Secondary | ICD-10-CM

## 2022-01-19 HISTORY — PX: BREAST BIOPSY: SHX20

## 2022-01-19 IMAGING — MG MM BREAST LOCALIZATION CLIP
4 series · 4 of 12 positions shown · non-contrast
Comparison: Previous exam(s).
COMPARISON: Previous exam(s).

Addendum:
CLINICAL DATA: Status post MR guided core biopsy of mass in the
UPPER central LEFT breast.

EXAM:
3D DIAGNOSTIC LEFT MAMMOGRAM POST MRI BIOPSY

[L ML synth-2D]
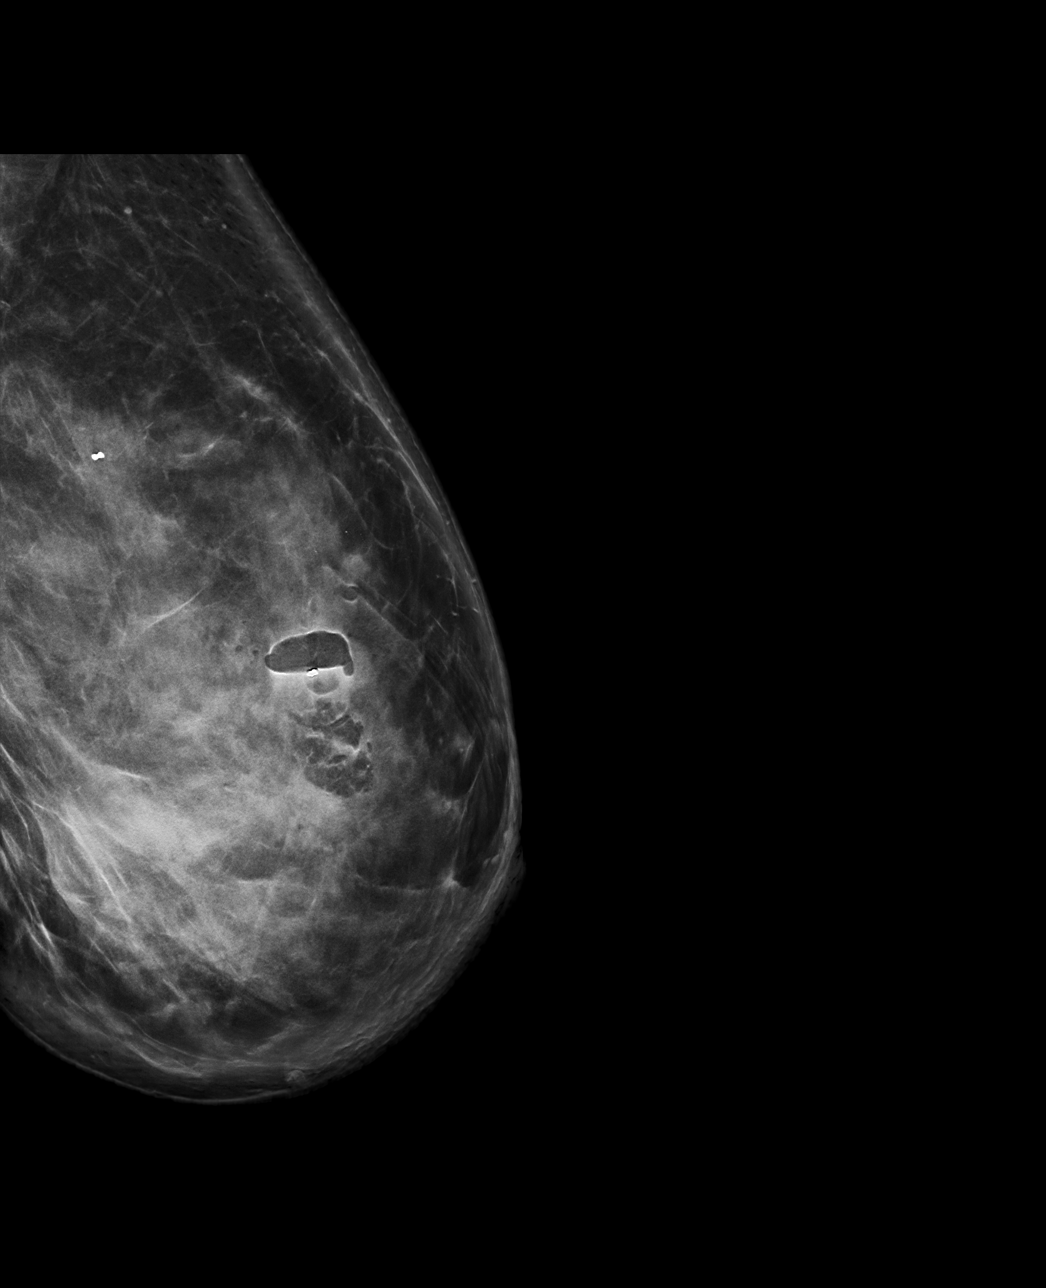

[L CC synth-2D]
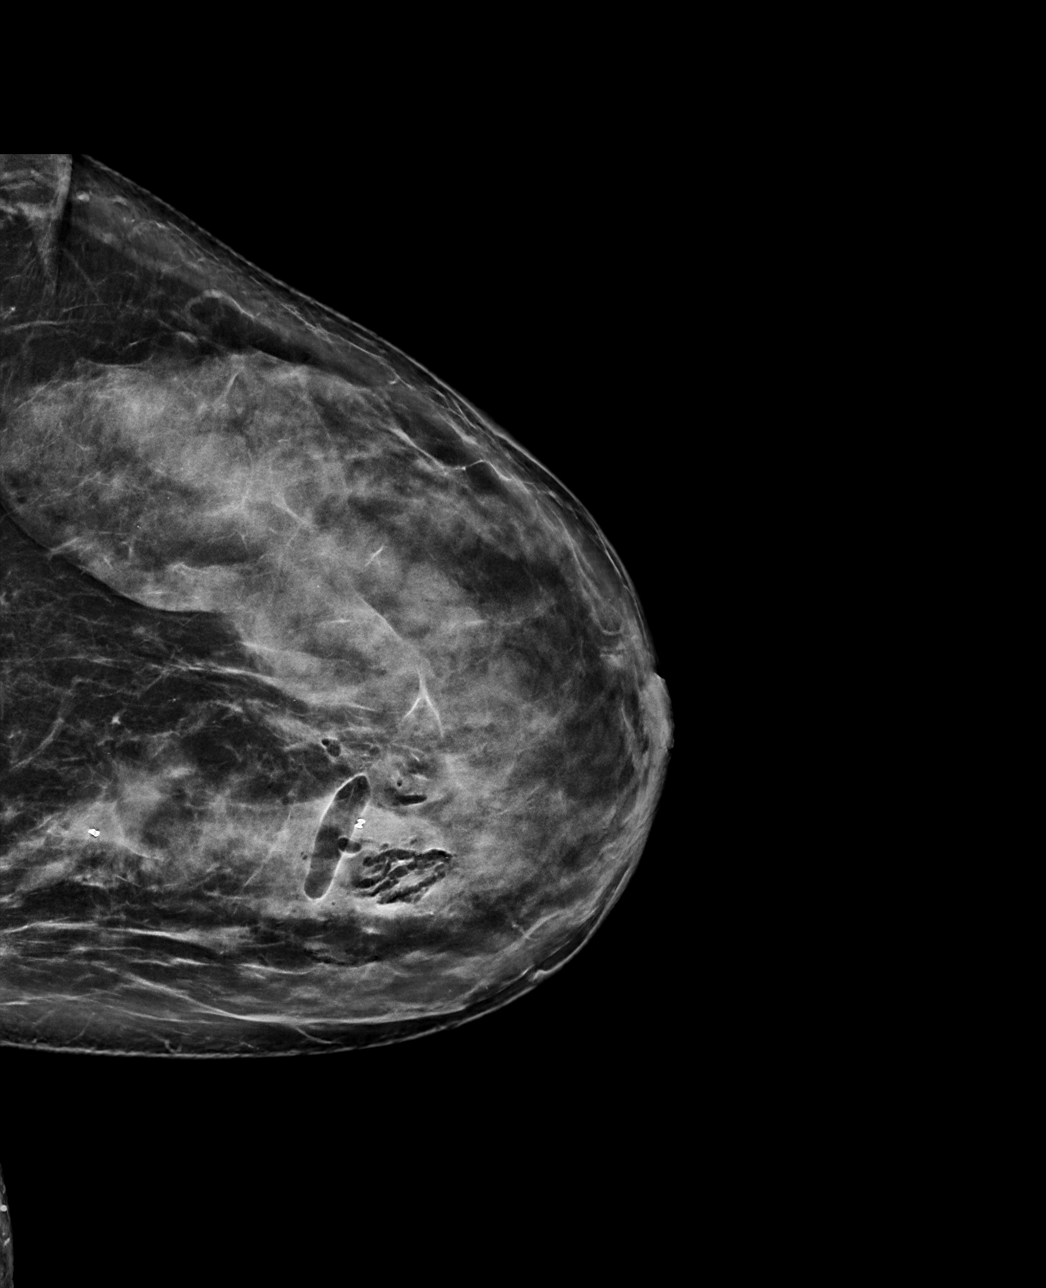

[L CC tomo · tomo slice 48/95.0]
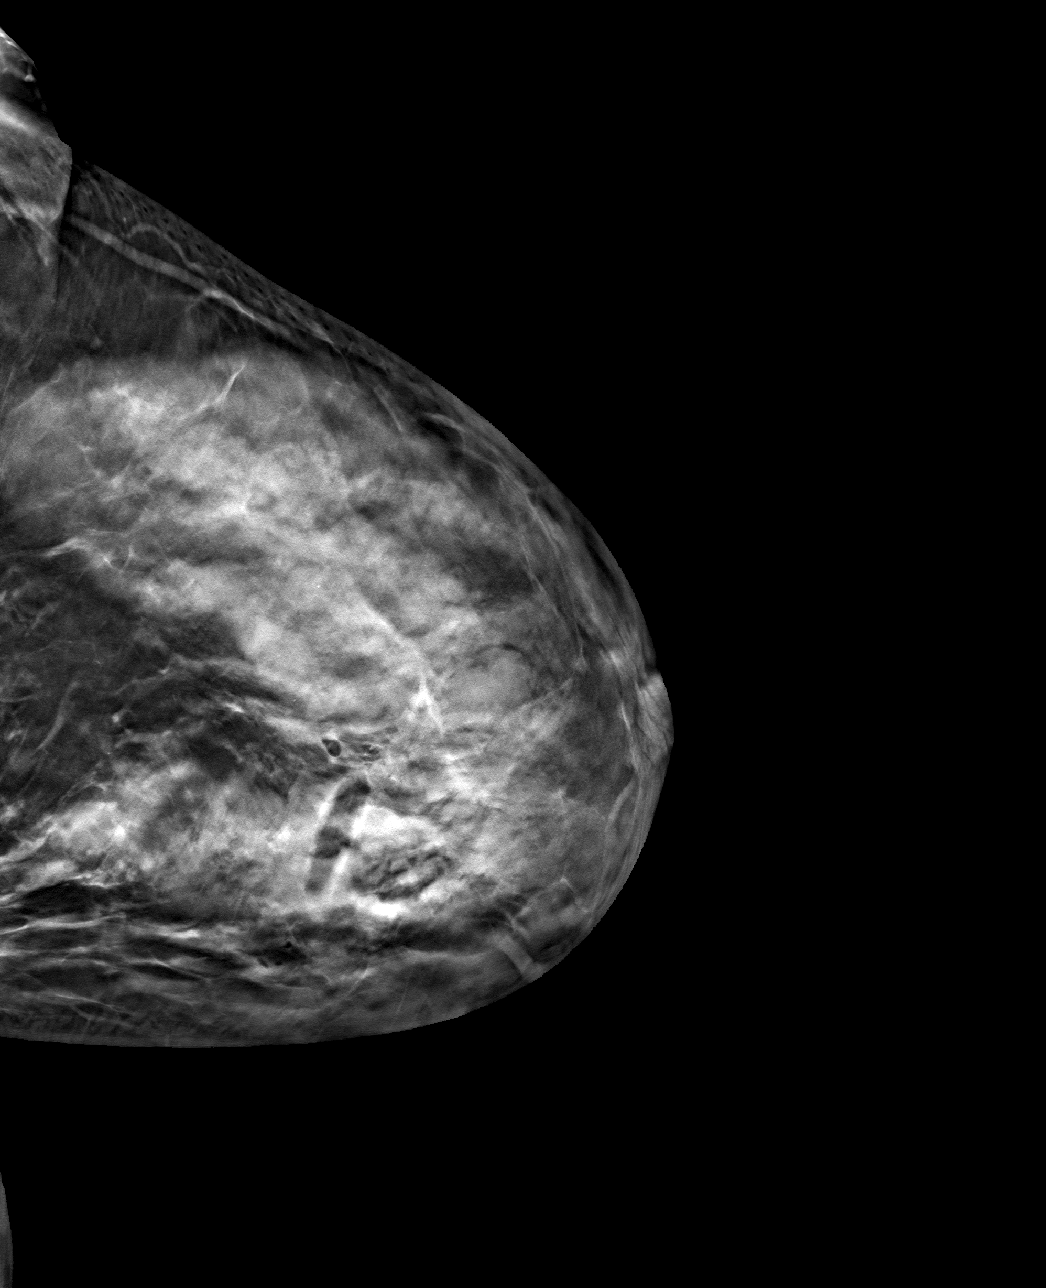

[L ML tomo · tomo slice 53/104.0]
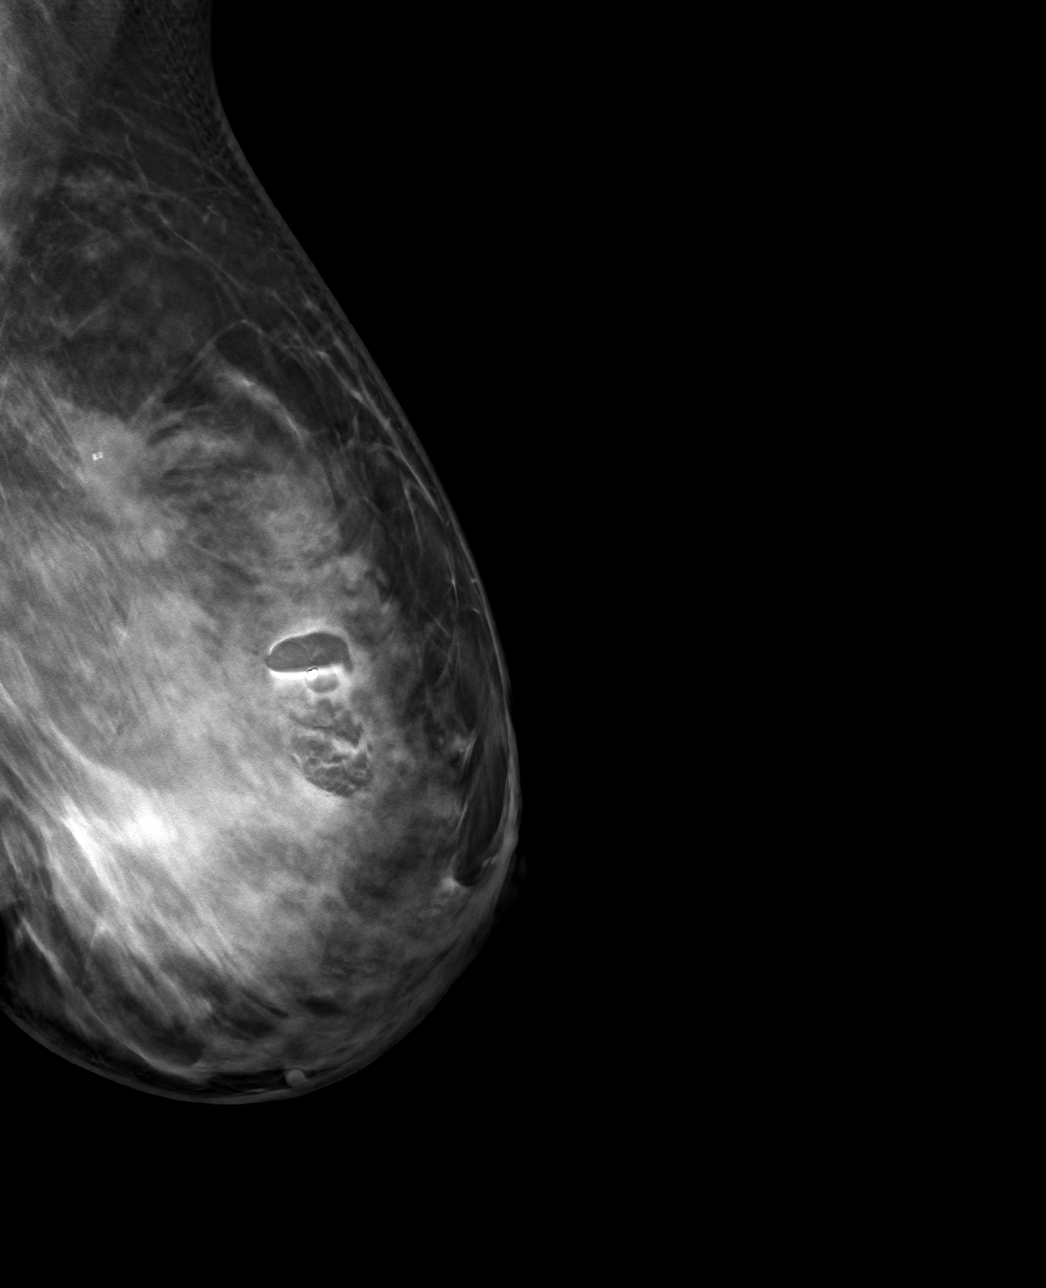

[4 of 12 positions shown; findings below may reference images not displayed]

FINDINGS: 3D Mammographic images were obtained following MRI guided biopsy of
mass in the UPPER central LEFT breast and placement of a barbell
clip. The biopsy marking clip is in expected position at the site of
biopsy.
IMPRESSION: Appropriate positioning of the barbell shaped biopsy marking clip at
the site of biopsy in the UPPER central LEFT breast.

Final Assessment: Post Procedure Mammograms for Marker Placement

ADDENDUM:
Clips are 6.0 centimeters apart on the true LATERAL projection.

*** End of Addendum ***
FINDINGS: 3D Mammographic images were obtained following MRI guided biopsy of
mass in the UPPER central LEFT breast and placement of a barbell
clip. The biopsy marking clip is in expected position at the site of
biopsy.
IMPRESSION: Appropriate positioning of the barbell shaped biopsy marking clip at
the site of biopsy in the UPPER central LEFT breast.

Final Assessment: Post Procedure Mammograms for Marker Placement

## 2022-01-19 IMAGING — MR MR BREAST BX W LOC DEV 1ST LESION IMAGE BX SPEC MR GUIDE*L*
9 of 12 series · 33 of 48 positions shown · IV contrast (10 ML GADAVIST)
Comparison: Previous exams.
COMPARISON: Previous exams.

Addendum:
CLINICAL DATA: Patient presents for MR guided core biopsy of the
LEFT breast. Recent diagnosis of LEFT breast cancer. The area of
concern is a small mass in the mid to anterior depth UPPER central
LEFT breast.

EXAM:
MRI GUIDED CORE NEEDLE BIOPSY OF THE LEFT BREAST
TECHNIQUE: Multiplanar, multisequence MR imaging of the LEFT breast was
performed both before and after administration of intravenous
contrast.
CONTRAST:  10mL GADAVIST GADOBUTROL 1 MMOL/ML IV SOLN

[Series 2: fiducial unilateral · sagittal · 2.0mm · 1.33mm/px · 3 of 52 slices shown]
[im 1/52]
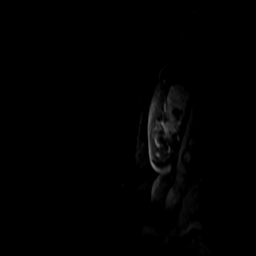
[im 26/52]
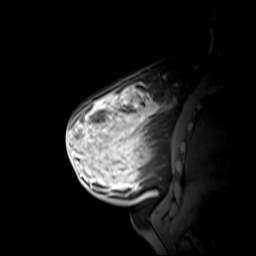
[im 52/52]
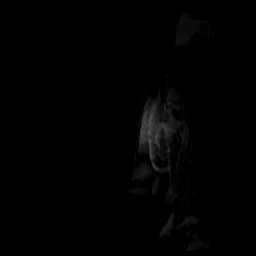

[Series 3: dynamic pre · axial · non-contrast · 1.3mm · 0.73mm/px · z∈[-108,+78]mm · 5 of 144 slices shown]
[im 1/144]
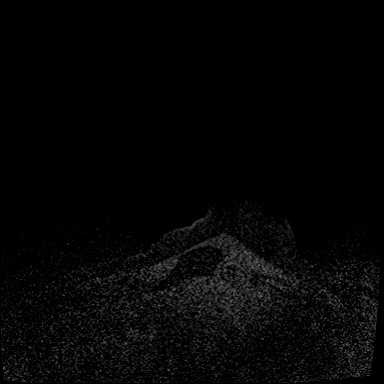
[im 36/144]
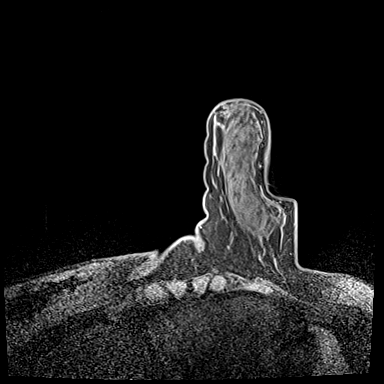
[im 72/144]
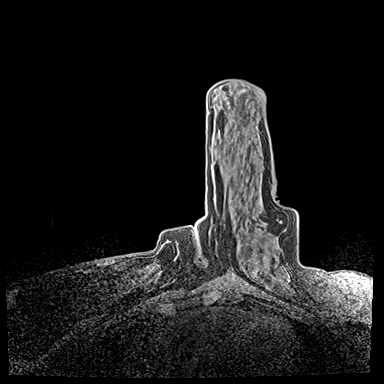
[im 108/144]
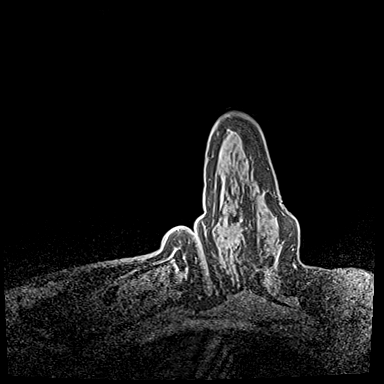
[im 144/144]
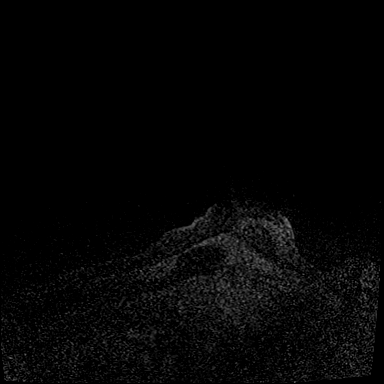

[Series 4: dynamic post 20 · axial · 1.3mm · 0.73mm/px · z∈[-108,+78]mm · 4 of 144 slices shown (1 of 2)]
[im 1/144]
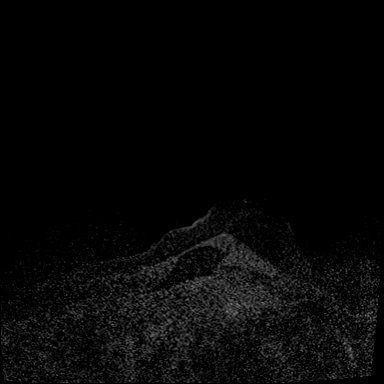
[im 48/144]
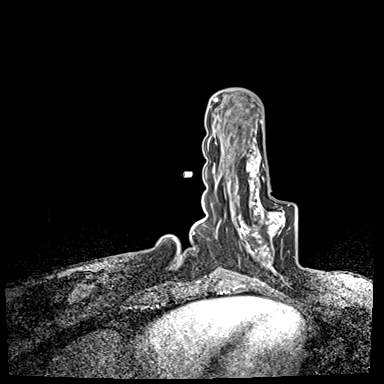
[im 96/144]
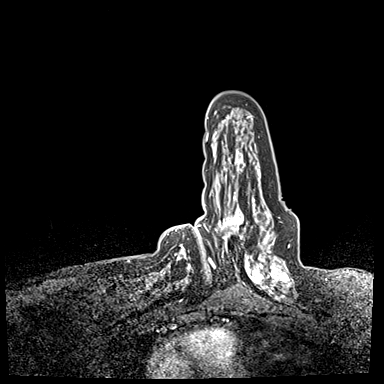
[im 144/144]
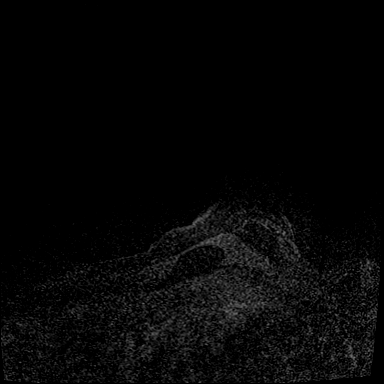

[Series 5: dynamic post 20 · axial · 1.3mm · 0.73mm/px · z∈[-108,+78]mm · 4 of 144 slices shown (2 of 2)]
[im 1/144]
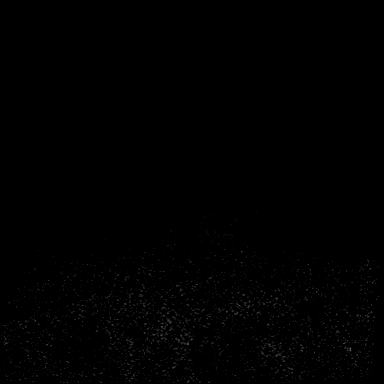
[im 48/144]
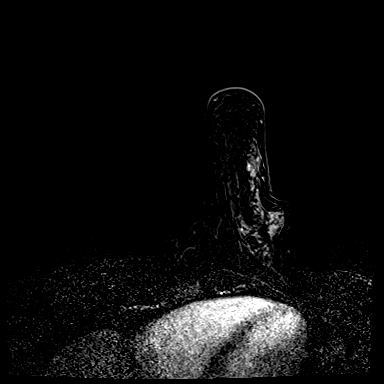
[im 96/144]
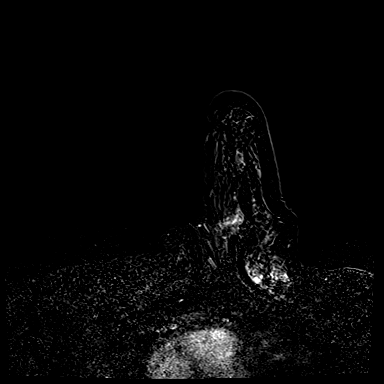
[im 144/144]
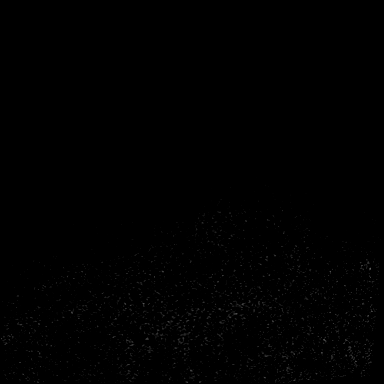

[Series 6: dynamic post 3 · axial · 1.3mm · 0.73mm/px · z∈[-108,+78]mm · 4 of 144 slices shown (1 of 2)]
[im 1/144]
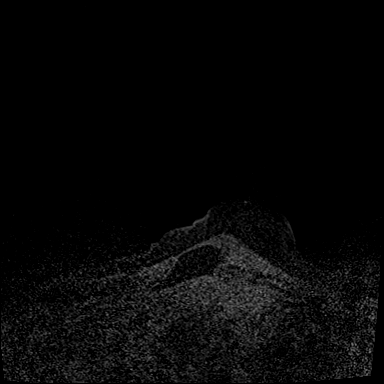
[im 48/144]
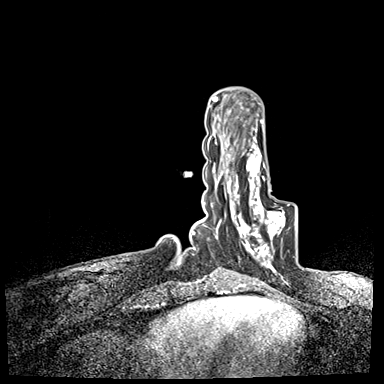
[im 96/144]
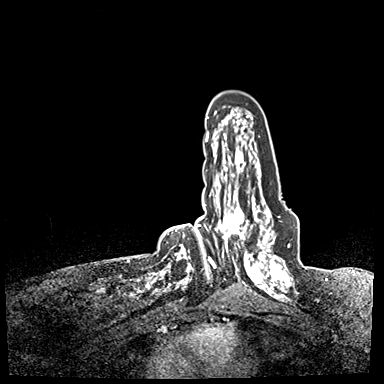
[im 144/144]
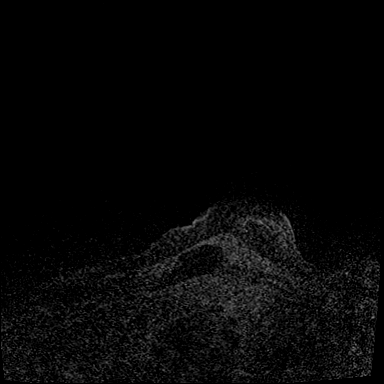

[Series 7: dynamic post 3 · axial · 1.3mm · 0.73mm/px · z∈[-108,+78]mm · 4 of 144 slices shown (2 of 2)]
[im 1/144]
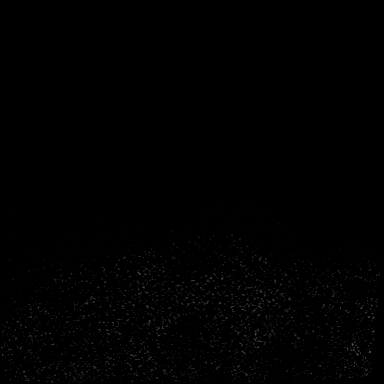
[im 48/144]
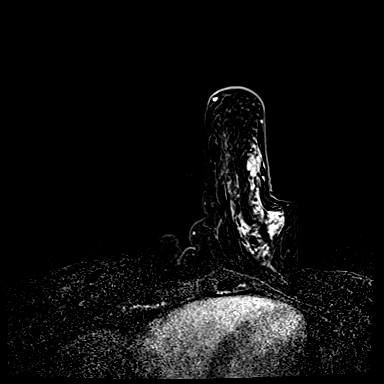
[im 96/144]
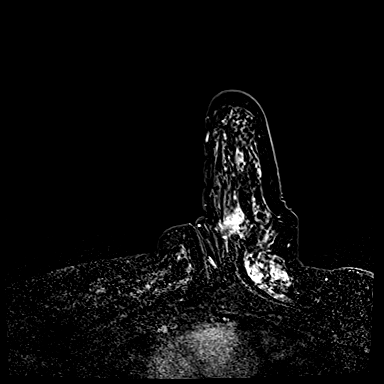
[im 144/144]
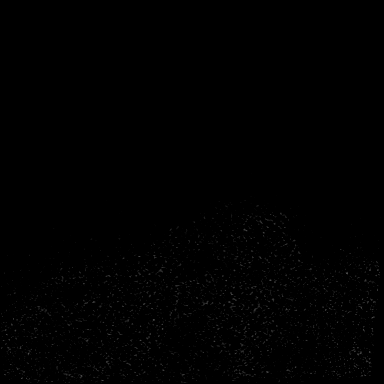

[Series 8: marker · axial · 1.3mm · 0.73mm/px · z∈[-108,+78]mm · 4 of 144 slices shown]
[im 1/144]
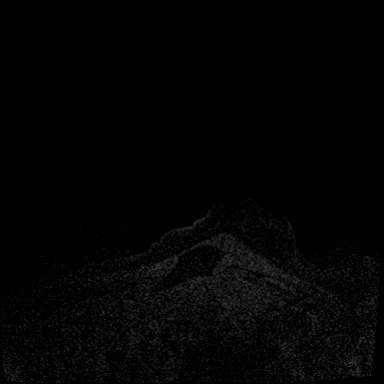
[im 48/144]
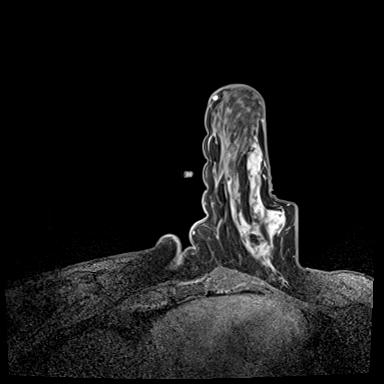
[im 96/144]
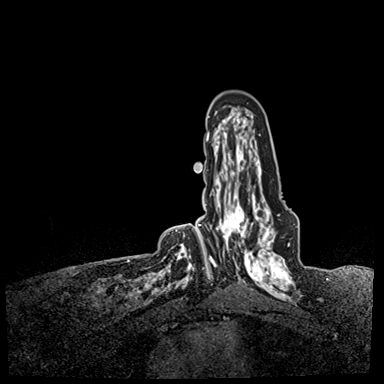
[im 144/144]
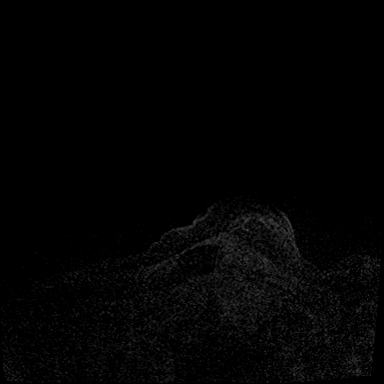

[Series 9: marker_sub · axial · 1.3mm · 0.73mm/px · z∈[-108,+78]mm · 4 of 144 slices shown]
[im 1/144]
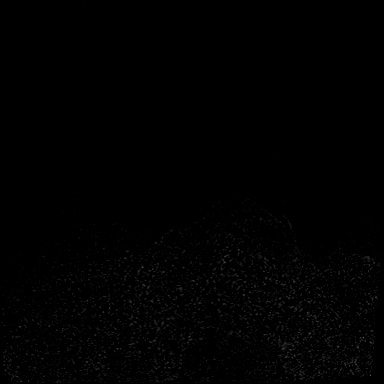
[im 48/144]
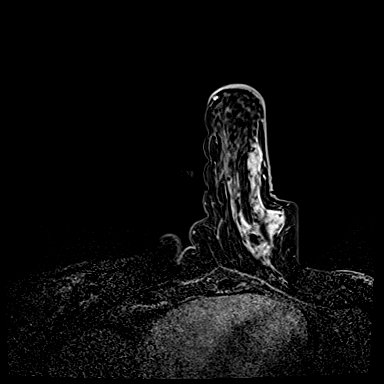
[im 96/144]
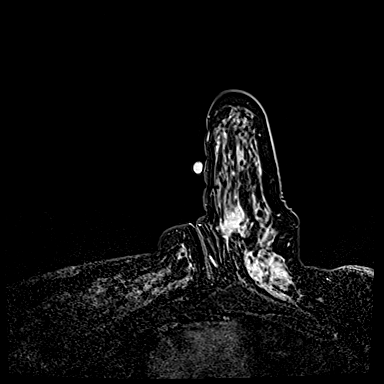
[im 144/144]
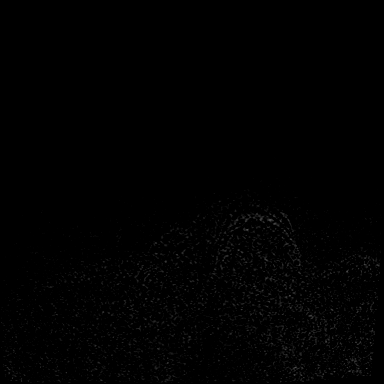

[Series 10: needle confirmation · axial · 1.3mm · 0.73mm/px · 1 of 144 slices shown]
[im 1/144]
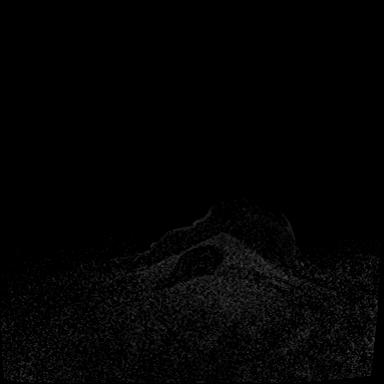

[33 of 48 positions shown; findings below may reference images not displayed]

FINDINGS: I met with the patient, and we discussed the procedure of MRI guided
biopsy, including risks, benefits, and alternatives. Specifically,
we discussed the risks of infection, bleeding, tissue injury, clip
migration, and inadequate sampling. Informed, written consent was
given. The usual time out protocol was performed immediately prior
to the procedure.

Using sterile technique, 1% Lidocaine, MRI guidance, and a 9 gauge
vacuum assisted device, biopsy was performed of mass in the UPPER
central LEFT breast using a MEDIAL to LATERAL approach. At the
conclusion of the procedure, a barbell shaped tissue marker clip was
deployed into the biopsy cavity. Follow-up 2-view mammogram was
performed and dictated separately.
IMPRESSION: MRI guided biopsy of LEFT breast mass.  No apparent complications.

ADDENDUM:
Pathology revealed GRADE III INVASIVE DUCTAL CARCINOMA of the LEFT
breast, upper central (barbell clip). This was found to be
concordant by Dr. HITO.

Pathology results were discussed with the patient by telephone. The
patient reported doing well after the biopsy with tenderness at the
site. Post biopsy instructions and care were reviewed and questions
were answered. The patient was encouraged to call The [REDACTED]

The patient has a recent diagnosis of left breast cancer and should
follow her outlined treatment plan.

Pathology results reported by HITO RN on [DATE].

*** End of Addendum ***
FINDINGS: I met with the patient, and we discussed the procedure of MRI guided
biopsy, including risks, benefits, and alternatives. Specifically,
we discussed the risks of infection, bleeding, tissue injury, clip
migration, and inadequate sampling. Informed, written consent was
given. The usual time out protocol was performed immediately prior
to the procedure.

Using sterile technique, 1% Lidocaine, MRI guidance, and a 9 gauge
vacuum assisted device, biopsy was performed of mass in the UPPER
central LEFT breast using a MEDIAL to LATERAL approach. At the
conclusion of the procedure, a barbell shaped tissue marker clip was
deployed into the biopsy cavity. Follow-up 2-view mammogram was
performed and dictated separately.
IMPRESSION: MRI guided biopsy of LEFT breast mass.  No apparent complications.

## 2022-01-19 MED ORDER — GADOBUTROL 1 MMOL/ML IV SOLN
10.0000 mL | Freq: Once | INTRAVENOUS | Status: AC | PRN
  Administered 2022-01-19: 10 mL via INTRAVENOUS
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
## 2022-01-21 ENCOUNTER — Encounter: Payer: Self-pay | Admitting: *Deleted

## 2022-01-22 ENCOUNTER — Ambulatory Visit (INDEPENDENT_AMBULATORY_CARE_PROVIDER_SITE_OTHER): Payer: 59 | Admitting: Psychiatry

## 2022-01-22 ENCOUNTER — Other Ambulatory Visit: Payer: Self-pay

## 2022-01-22 ENCOUNTER — Encounter: Payer: Self-pay | Admitting: *Deleted

## 2022-01-22 DIAGNOSIS — F4310 Post-traumatic stress disorder, unspecified: Secondary | ICD-10-CM

## 2022-01-22 DIAGNOSIS — F330 Major depressive disorder, recurrent, mild: Secondary | ICD-10-CM | POA: Diagnosis not present
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
## 2022-01-23 ENCOUNTER — Encounter: Payer: Self-pay | Admitting: Psychiatry

## 2022-01-23 ENCOUNTER — Telehealth (INDEPENDENT_AMBULATORY_CARE_PROVIDER_SITE_OTHER): Payer: 59 | Admitting: Psychiatry

## 2022-01-23 ENCOUNTER — Other Ambulatory Visit: Payer: Self-pay | Admitting: Hematology and Oncology

## 2022-01-23 ENCOUNTER — Other Ambulatory Visit: Payer: Self-pay

## 2022-01-23 DIAGNOSIS — F3341 Major depressive disorder, recurrent, in partial remission: Secondary | ICD-10-CM

## 2022-01-23 DIAGNOSIS — F4310 Post-traumatic stress disorder, unspecified: Secondary | ICD-10-CM

## 2022-01-23 MED ORDER — PRAZOSIN HCL 2 MG PO CAPS: 2.0000 mg | ORAL_CAPSULE | Freq: Every day | ORAL | 0 refills | Status: DC

## 2022-01-23 NOTE — Patient Instructions (Signed)
1. Continue sertraline 200 mg daily  ?2. Continue hydroxyzine 25 mg daily as needed for anxiety  ?3. Continue prazosin 2 mg at night ?3. Next appointment- 4/14 at 10 AM  ?

## 2022-01-28 MED FILL — Dexamethasone Sodium Phosphate Inj 100 MG/10ML: INTRAMUSCULAR | Qty: 1 | Status: AC

## 2022-01-29 ENCOUNTER — Encounter: Payer: Self-pay | Admitting: *Deleted

## 2022-01-29 ENCOUNTER — Inpatient Hospital Stay (HOSPITAL_BASED_OUTPATIENT_CLINIC_OR_DEPARTMENT_OTHER): Payer: 59 | Admitting: Adult Health

## 2022-01-29 ENCOUNTER — Inpatient Hospital Stay: Payer: 59 | Attending: Hematology and Oncology

## 2022-01-29 ENCOUNTER — Inpatient Hospital Stay: Payer: 59

## 2022-01-29 ENCOUNTER — Encounter: Payer: Self-pay | Admitting: Adult Health

## 2022-01-29 ENCOUNTER — Other Ambulatory Visit: Payer: Self-pay

## 2022-01-29 VITALS — BP 126/63 | HR 79 | Temp 97.5°F | Resp 18 | Ht 67.0 in | Wt 210.8 lb

## 2022-01-29 DIAGNOSIS — Z170 Estrogen receptor positive status [ER+]: Secondary | ICD-10-CM | POA: Diagnosis not present

## 2022-01-29 DIAGNOSIS — C50212 Malignant neoplasm of upper-inner quadrant of left female breast: Secondary | ICD-10-CM | POA: Diagnosis not present

## 2022-01-29 DIAGNOSIS — Z5111 Encounter for antineoplastic chemotherapy: Secondary | ICD-10-CM | POA: Insufficient documentation

## 2022-01-29 DIAGNOSIS — Z5189 Encounter for other specified aftercare: Secondary | ICD-10-CM | POA: Insufficient documentation

## 2022-01-29 LAB — CBC WITH DIFFERENTIAL (CANCER CENTER ONLY)
Abs Immature Granulocytes: 0.07 10*3/uL (ref 0.00–0.07)
Basophils Absolute: 0 10*3/uL (ref 0.0–0.1)
Basophils Relative: 0 %
Eosinophils Absolute: 0.1 10*3/uL (ref 0.0–0.5)
Eosinophils Relative: 1 %
HCT: 41.1 % (ref 36.0–46.0)
Hemoglobin: 13.4 g/dL (ref 12.0–15.0)
Immature Granulocytes: 0 %
Lymphocytes Relative: 8 %
Lymphs Abs: 1.2 10*3/uL (ref 0.7–4.0)
MCH: 30.2 pg (ref 26.0–34.0)
MCHC: 32.6 g/dL (ref 30.0–36.0)
MCV: 92.6 fL (ref 80.0–100.0)
Monocytes Absolute: 0.5 10*3/uL (ref 0.1–1.0)
Monocytes Relative: 3 %
Neutro Abs: 14.4 10*3/uL — ABNORMAL HIGH (ref 1.7–7.7)
Neutrophils Relative %: 88 %
Platelet Count: 319 10*3/uL (ref 150–400)
RBC: 4.44 MIL/uL (ref 3.87–5.11)
RDW: 13 % (ref 11.5–15.5)
WBC Count: 16.3 10*3/uL — ABNORMAL HIGH (ref 4.0–10.5)
nRBC: 0 % (ref 0.0–0.2)

## 2022-01-29 LAB — CMP (CANCER CENTER ONLY)
ALT: 9 U/L (ref 0–44)
AST: 10 U/L — ABNORMAL LOW (ref 15–41)
Albumin: 4.4 g/dL (ref 3.5–5.0)
Alkaline Phosphatase: 64 U/L (ref 38–126)
Anion gap: 8 (ref 5–15)
BUN: 15 mg/dL (ref 6–20)
CO2: 25 mmol/L (ref 22–32)
Calcium: 9.8 mg/dL (ref 8.9–10.3)
Chloride: 106 mmol/L (ref 98–111)
Creatinine: 0.6 mg/dL (ref 0.44–1.00)
GFR, Estimated: >60 mL/min (ref >60)
Glucose, Bld: 110 mg/dL — ABNORMAL HIGH (ref 70–99)
Potassium: 3.7 mmol/L (ref 3.5–5.1)
Sodium: 139 mmol/L (ref 135–145)
Total Bilirubin: 0.3 mg/dL (ref 0.3–1.2)
Total Protein: 7.7 g/dL (ref 6.5–8.1)

## 2022-01-29 MED ORDER — SODIUM CHLORIDE 0.9 % IV SOLN
75.0000 mg/m2 | Freq: Once | INTRAVENOUS | Status: AC
  Administered 2022-01-29: 11:00:00 160 mg via INTRAVENOUS
  Filled 2022-01-29: qty 16

## 2022-01-29 MED ORDER — SODIUM CHLORIDE 0.9 % IV SOLN: Freq: Once | INTRAVENOUS | Status: AC

## 2022-01-29 MED ORDER — PALONOSETRON HCL INJECTION 0.25 MG/5ML
0.2500 mg | Freq: Once | INTRAVENOUS | Status: AC
  Administered 2022-01-29: 10:00:00 0.25 mg via INTRAVENOUS
  Filled 2022-01-29: qty 5

## 2022-01-29 MED ORDER — SODIUM CHLORIDE 0.9 % IV SOLN
600.0000 mg/m2 | Freq: Once | INTRAVENOUS | Status: AC
  Administered 2022-01-29: 12:00:00 1280 mg via INTRAVENOUS
  Filled 2022-01-29: qty 64

## 2022-01-29 MED ORDER — SODIUM CHLORIDE 0.9 % IV SOLN
10.0000 mg | Freq: Once | INTRAVENOUS | Status: AC
  Administered 2022-01-29: 10:00:00 10 mg via INTRAVENOUS
  Filled 2022-01-29: qty 10

## 2022-01-29 NOTE — Progress Notes (Signed)
[facility] Follow up:    Maria Rio, MD [address] [address]   DIAGNOSIS:  Cancer Staging  Malignant neoplasm of upper-inner quadrant of left breast in female, estrogen receptor positive ([place]) Staging form: Breast, AJCC 8th Edition - Clinical: Stage IIB (cT2, cN0, cM0, G3, ER+, PR-, HER2-) - Unsigned Stage prefix: Initial diagnosis Histologic grading system: 3 grade system   SUMMARY OF ONCOLOGIC HISTORY: Oncology History  Malignant neoplasm of upper-inner quadrant of left breast in female, estrogen receptor positive ([place])  12/31/2021 Initial Diagnosis   Malignant neoplasm of upper-inner quadrant of left breast in female, estrogen receptor positive ([place])   01/08/2022 -  Chemotherapy   Patient is on Treatment Plan : BREAST TC q21d       CURRENT THERAPY: Taxotere/Cytoxan  INTERVAL HISTORY: Maria Diaz 51 y.o. female returns for evaluation of stage IIb left breast cancer here today for neoadjuvant chemotherapy.  Today she will receive cycle 2 of therapy.  She notes that the deep ache that she had associated with her left-sided breast cancer has since resolved.  She denies any new concerns today.  In particular she does not have any peripheral neuropathy or mouth sores, ulcerations, constipation, or significant fatigue.   Patient Active Problem List   Diagnosis Date Noted   Anemia 12/31/2021   Malignant neoplasm of upper-inner quadrant of left breast in female, estrogen receptor positive ([place]) 12/31/2021   Open angle with borderline findings and low glaucoma risk in both eyes 01/11/2016   Chronic idiopathic urticaria 01/11/2016   Age-related nuclear cataract of both eyes 01/11/2016   Eczema 01/11/2016    is allergic to haemophilus influenzae vaccines and penicillins.  MEDICAL HISTORY: Past Medical History:  Diagnosis Date   Anemia    Anxiety    Depression    Glaucoma     SURGICAL HISTORY: Past Surgical History:  Procedure  Laterality Date   BREAST BIOPSY Left 01/19/2022   BREAST CYST EXCISION Left 01/28/2021   Procedure: LEFT BREAST MASS EXCISION;  Surgeon: Rolm Bookbinder, MD;  Location: [facility];  Service: General;  Laterality: Left;  START TIME OF 3:00 PM FOR 60 MINUTES IN ROOM 8   HYSTEROSCOPY W/ ENDOMETRIAL ABLATION  08/14/2020   [facility]    SOCIAL HISTORY: Social History   Socioeconomic History   Marital status: Divorced    Spouse name: Not on file   Number of children: Not on file   Years of education: Not on file   Highest education level: Not on file  Occupational History   Not on file  Tobacco Use   Smoking status: Never   Smokeless tobacco: Never  Substance and Sexual Activity   Alcohol use: Yes    Alcohol/week: 1.0 standard drink    Types: 1 Glasses of wine per week    Comment: holidays   Drug use: Never   Sexual activity: Yes    Birth control/protection: None  Other Topics Concern   Not on file  Social History Narrative   Not on file   Social Determinants of Health   Financial Resource Strain: Not on file  Food Insecurity: Not on file  Transportation Needs: Not on file  Physical Activity: Not on file  Stress: Not on file  Social Connections: Not on file  Intimate Partner Violence: Not on file    FAMILY HISTORY: Family History  Problem Relation Age of Onset   Anxiety disorder Sister    Drug abuse  Mother     Review of Systems  Constitutional:  Negative for appetite change, chills, fatigue, fever and unexpected weight change.  HENT:   Negative for hearing loss, lump/mass and trouble swallowing.   Eyes:  Negative for eye problems and icterus.  Respiratory:  Negative for chest tightness, cough and shortness of breath.   Cardiovascular:  Negative for chest pain, leg swelling and palpitations.  Gastrointestinal:  Negative for abdominal distention, abdominal pain, constipation, diarrhea, nausea and vomiting.  Endocrine: Negative for hot flashes.   Genitourinary:  Negative for difficulty urinating.   Musculoskeletal:  Negative for arthralgias.  Skin:  Negative for itching and rash.  Neurological:  Negative for dizziness, extremity weakness, headaches and numbness.  Hematological:  Negative for adenopathy. Does not bruise/bleed easily.  Psychiatric/Behavioral:  Negative for depression. The patient is not nervous/anxious.      PHYSICAL EXAMINATION  ECOG PERFORMANCE STATUS: 1 - Symptomatic but completely ambulatory  Vitals:   01/29/22 0828  BP: 126/63  Pulse: 79  Resp: 18  Temp: (!) 97.5 F (36.4 C)  SpO2: 98%    Physical Exam Constitutional:      General: She is not in acute distress.    Appearance: Normal appearance. She is not toxic-appearing.  HENT:     Head: Normocephalic and atraumatic.  Eyes:     General: No scleral icterus. Cardiovascular:     Rate and Rhythm: Normal rate and regular rhythm.     Pulses: Normal pulses.     Heart sounds: Normal heart sounds.  Pulmonary:     Effort: Pulmonary effort is normal.     Breath sounds: Normal breath sounds.  Abdominal:     General: Abdomen is flat. Bowel sounds are normal. There is no distension.     Palpations: Abdomen is soft.     Tenderness: There is no abdominal tenderness.  Musculoskeletal:        General: No swelling.     Cervical back: Neck supple.  Lymphadenopathy:     Cervical: No cervical adenopathy.  Skin:    General: Skin is warm and dry.     Findings: No rash.  Neurological:     General: No focal deficit present.     Mental Status: She is alert.  Psychiatric:        Mood and Affect: Mood normal.        Behavior: Behavior normal.    LABORATORY DATA:  CBC    Component Value Date/Time   WBC 16.3 (H) 01/29/2022 0816   WBC 8.0 07/20/2020 2320   RBC 4.44 01/29/2022 0816   HGB 13.4 01/29/2022 0816   HCT 41.1 01/29/2022 0816   PLT 319 01/29/2022 0816   MCV 92.6 01/29/2022 0816   MCH 30.2 01/29/2022 0816   MCHC 32.6 01/29/2022 0816   RDW  13.0 01/29/2022 0816   LYMPHSABS 1.2 01/29/2022 0816   MONOABS 0.5 01/29/2022 0816   EOSABS 0.1 01/29/2022 0816   BASOSABS 0.0 01/29/2022 0816    CMP     Component Value Date/Time   NA 139 01/29/2022 0816   K 3.7 01/29/2022 0816   CL 106 01/29/2022 0816   CO2 25 01/29/2022 0816   GLUCOSE 110 (H) 01/29/2022 0816   BUN 15 01/29/2022 0816   CREATININE 0.60 01/29/2022 0816   CALCIUM 9.8 01/29/2022 0816   PROT 7.7 01/29/2022 0816   ALBUMIN 4.4 01/29/2022 0816   AST 10 (L) 01/29/2022 0816   ALT 9 01/29/2022 0816   ALKPHOS 64 01/29/2022 0816  BILITOT 0.3 01/29/2022 0816   GFRNONAA >60 01/29/2022 0816   GFRAA >60 07/20/2020 2320       ASSESSMENT and THERAPY PLAN:   Malignant neoplasm of upper-inner quadrant of left breast in female, estrogen receptor positive ([place]) December 31, 2021 initial biopsy reveals clinical stage IIb ER 40% weak, PR negative, HER2 negative invasive ductal carcinoma.  Treatment plan: 1.  Neoadjuvant chemotherapy with Taxotere and Cytoxan x4 2.  Surgery and lymph node biopsy 3.  Adjuvant radiation 4.  Antiestrogen therapy -----------------------------------------------------------------------------------  Maria Diaz is here today to receive cycle 2 of her Taxotere and Cytoxan.  Her CBC is stable.  She will proceed with her second cycle so long as her c-Met was within parameters.  We talked about activity and maintaining her conditioning through chemo.  Maria Diaz will return in 3 weeks for labs, follow-up, cycle 3 of treatment.     All questions were answered. The patient knows to call the clinic with any problems, questions or concerns. We can certainly see the patient much sooner if necessary.  Total encounter time: 20 minutes face-to-face visit time, chart review, lab review, care coordination, and documentation of the encounter.  Wilber Bihari, NP 01/29/22 8:59 AM Medical Oncology and Hematology [facility] [place], [place] [address] Tel. [phone]    Fax. [phone]  *Total Encounter Time as defined by the Centers for Medicare and Medicaid Services includes, in addition to the face-to-face time of a patient visit (documented in the note above) non-face-to-face time: obtaining and reviewing outside history, ordering and reviewing medications, tests or procedures, care coordination (communications with other health care professionals or caregivers) and documentation in the medical record.

## 2022-01-29 NOTE — Assessment & Plan Note (Addendum)
December 31, 2021 initial biopsy reveals clinical stage IIb ER 40% weak, PR negative, HER2 negative invasive ductal carcinoma. ? ?Treatment plan: ?1.  Neoadjuvant chemotherapy with Taxotere and Cytoxan x4 ?2.  Surgery and lymph node biopsy ?3.  Adjuvant radiation ?4.  Antiestrogen therapy ?----------------------------------------------------------------------------------- ? ?Maria Diaz is here today to receive cycle 2 of her Taxotere and Cytoxan.  Her CBC is stable.  She will proceed with her second cycle so long as her c-Met was within parameters. ? ?We talked about activity and maintaining her conditioning through chemo. ? ?Maria Diaz will return in 3 weeks for labs, follow-up, cycle 3 of treatment. ?

## 2022-01-29 NOTE — Patient Instructions (Signed)
[facility]  Discharge Instructions: ?Thank you for choosing [facility] to provide your oncology and hematology care.  ? ?If you have a lab appointment with the [facility], please go directly to the [facility] and check in at the registration area. ?  ?Wear comfortable clothing and clothing appropriate for easy access to any Portacath or PICC line.  ? ?We strive to give you quality time with your provider. You may need to reschedule your appointment if you arrive late (15 or more minutes).  Arriving late affects you and other patients whose appointments are after yours.  Also, if you miss three or more appointments without notifying the office, you may be dismissed from the clinic at the provider?s discretion.    ?  ?For prescription refill requests, have your pharmacy contact our office and allow 72 hours for refills to be completed.   ? ?Today you received the following chemotherapy and/or immunotherapy agents: Docetaxel and Cyclophosphamide     ?  ?To help prevent nausea and vomiting after your treatment, we encourage you to take your nausea medication as directed. ? ?BELOW ARE SYMPTOMS THAT SHOULD BE REPORTED IMMEDIATELY: ?*FEVER GREATER THAN 100.4 F (38 ?C) OR HIGHER ?*CHILLS OR SWEATING ?*NAUSEA AND VOMITING THAT IS NOT CONTROLLED WITH YOUR NAUSEA MEDICATION ?*UNUSUAL SHORTNESS OF BREATH ?*UNUSUAL BRUISING OR BLEEDING ?*URINARY PROBLEMS (pain or burning when urinating, or frequent urination) ?*BOWEL PROBLEMS (unusual diarrhea, constipation, pain near the anus) ?TENDERNESS IN MOUTH AND THROAT WITH OR WITHOUT PRESENCE OF ULCERS (sore throat, sores in mouth, or a toothache) ?UNUSUAL RASH, SWELLING OR PAIN  ?UNUSUAL VAGINAL DISCHARGE OR ITCHING  ? ?Items with * indicate a potential emergency and should be followed up as soon as possible or go to the Emergency Department if any problems should occur. ? ?Please show the CHEMOTHERAPY ALERT CARD or IMMUNOTHERAPY  ALERT CARD at check-in to the Emergency Department and triage nurse. ? ?Should you have questions after your visit or need to cancel or reschedule your appointment, please contact [facility]  Dept: [phone]  and follow the prompts.  Office hours are 8:00 a.m. to 4:30 p.m. Monday - Friday. Please note that voicemails left after 4:00 p.m. may not be returned until the following business day.  We are closed weekends and major holidays. You have access to a nurse at all times for urgent questions. Please call the main number to the clinic Dept: [phone] and follow the prompts. ? ? ?For any non-urgent questions, you may also contact your provider using MyChart. We now offer e-Visits for anyone 58 and older to request care online for non-urgent symptoms. For details visit [email or web address]. ?  ?Also download the MyChart app! Go to the app store, search "MyChart", open the app, select [facility], and log in with your MyChart username and password. ? ?Due to Covid, a mask is required upon entering the hospital/clinic. If you do not have a mask, one will be given to you upon arrival. For doctor visits, patients may have 1 support person aged 69 or older with them. For treatment visits, patients cannot have anyone with them due to current Covid guidelines and our immunocompromised population.  ? ?

## 2022-01-31 ENCOUNTER — Other Ambulatory Visit: Payer: Self-pay

## 2022-01-31 ENCOUNTER — Inpatient Hospital Stay: Payer: 59

## 2022-01-31 VITALS — BP 140/69 | HR 79 | Temp 97.8°F | Resp 16

## 2022-01-31 DIAGNOSIS — Z5111 Encounter for antineoplastic chemotherapy: Secondary | ICD-10-CM | POA: Diagnosis not present

## 2022-01-31 DIAGNOSIS — Z170 Estrogen receptor positive status [ER+]: Secondary | ICD-10-CM

## 2022-01-31 DIAGNOSIS — C50212 Malignant neoplasm of upper-inner quadrant of left female breast: Secondary | ICD-10-CM

## 2022-01-31 MED ORDER — PEGFILGRASTIM-BMEZ 6 MG/0.6ML ~~LOC~~ SOSY
6.0000 mg | PREFILLED_SYRINGE | Freq: Once | SUBCUTANEOUS | Status: AC
  Administered 2022-01-31: 6 mg via SUBCUTANEOUS
  Filled 2022-01-31: qty 0.6

## 2022-01-31 NOTE — Patient Instructions (Addendum)
\[id number] Pegfilgrastim Injection ?What is this medication? ?PEGFILGRASTIM (PEG fil gra stim) lowers the risk of infection in people who are receiving chemotherapy. It works by Building control surveyor make more white blood cells, which protects your body from infection. It may also be used to help people who have been exposed to high doses of radiation. ?This medicine may be used for other purposes; ask your health care provider or pharmacist if you have questions. ?COMMON BRAND NAME(S): Rexene Edison, Ziextenzo ?What should I tell my care team before I take this medication? ?They need to know if you have any of these conditions: ?Kidney disease ?Latex allergy ?Ongoing radiation therapy ?Sickle cell disease ?Skin reactions to acrylic adhesives (On-Body Injector only) ?An unusual or allergic reaction to pegfilgrastim, filgrastim, other medications, foods, dyes, or preservatives ?Pregnant or trying to get pregnant ?Breast-feeding ?How should I use this medication? ?This medication is for injection under the skin. If you get this medication at home, you will be taught how to prepare and give the pre-filled syringe or how to use the On-body Injector. Refer to the patient Instructions for Use for detailed instructions. Use exactly as directed. Tell your care team immediately if you suspect that the On-body Injector may not have performed as intended or if you suspect the use of the On-body Injector resulted in a missed or partial dose. ?It is important that you put your used needles and syringes in a special sharps container. Do not put them in a trash can. If you do not have a sharps container, call your pharmacist or care team to get one. ?Talk to your care team about the use of this medication in children. While this medication may be prescribed for selected conditions, precautions do apply. ?Overdosage: If you think you have taken too much of this medicine contact a poison control center or  emergency room at once. ?NOTE: This medicine is only for you. Do not share this medicine with others. ?What if I miss a dose? ?It is important not to miss your dose. Call your care team if you miss your dose. If you miss a dose due to an On-body Injector failure or leakage, a new dose should be administered as soon as possible using a single prefilled syringe for manual use. ?What may interact with this medication? ?Interactions have not been studied. ?This list may not describe all possible interactions. Give your health care provider a list of all the medicines, herbs, non-prescription drugs, or dietary supplements you use. Also tell them if you smoke, drink alcohol, or use illegal drugs. Some items may interact with your medicine. ?What should I watch for while using this medication? ?Your condition will be monitored carefully while you are receiving this medication. ?You may need blood work done while you are taking this medication. ?Talk to your care team about your risk of cancer. You may be more at risk for certain types of cancer if you take this medication. ?If you are going to need a MRI, CT scan, or other procedure, tell your care team that you are using this medication (On-Body Injector only). ?What side effects may I notice from receiving this medication? ?Side effects that you should report to your care team as soon as possible: ?Allergic reactions--skin rash, itching, hives, swelling of the face, lips, tongue, or throat ?Capillary leak syndrome--stomach or muscle pain, unusual weakness or fatigue, feeling faint or lightheaded, decrease in the amount of urine, swelling of the ankles, hands, or feet, trouble breathing ?High  white blood cell level--fever, fatigue, trouble breathing, night sweats, change in vision, weight loss ?Inflammation of the aorta--fever, fatigue, back, chest, or stomach pain, severe headache ?Kidney injury (glomerulonephritis)--decrease in the amount of urine, red or dark brown  urine, foamy or bubbly urine, swelling of the ankles, hands, or feet ?Shortness of breath or trouble breathing ?Spleen injury--pain in upper left stomach or shoulder ?Unusual bruising or bleeding ?Side effects that usually do not require medical attention (report to your care team if they continue or are bothersome): ?Bone pain ?Pain in the hands or feet ?This list may not describe all possible side effects. Call your doctor for medical advice about side effects. You may report side effects to FDA at 1-800-FDA-1088. ?Where should I keep my medication? ?Keep out of the reach of children. ?If you are using this medication at home, you will be instructed on how to store it. Throw away any unused medication after the expiration date on the label. ?NOTE: This sheet is a summary. It may not cover all possible information. If you have questions about this medicine, talk to your doctor, pharmacist, or health care provider. ?? 2022 Elsevier/Gold Standard (2021-07-29 00:00:00) ? ?

## 2022-02-02 ENCOUNTER — Encounter: Payer: Self-pay | Admitting: Adult Health

## 2022-02-02 ENCOUNTER — Other Ambulatory Visit: Payer: Self-pay

## 2022-02-02 DIAGNOSIS — Z170 Estrogen receptor positive status [ER+]: Secondary | ICD-10-CM

## 2022-02-02 DIAGNOSIS — C50212 Malignant neoplasm of upper-inner quadrant of left female breast: Secondary | ICD-10-CM

## 2022-02-02 MED ORDER — DEXAMETHASONE 4 MG PO TABS: 8.0000 mg | ORAL_TABLET | Freq: Two times a day (BID) | ORAL | 1 refills | Status: DC

## 2022-02-03 ENCOUNTER — Encounter: Payer: Self-pay | Admitting: General Surgery

## 2022-02-05 ENCOUNTER — Encounter (HOSPITAL_COMMUNITY): Payer: Self-pay

## 2022-02-05 ENCOUNTER — Ambulatory Visit (INDEPENDENT_AMBULATORY_CARE_PROVIDER_SITE_OTHER): Payer: 59 | Admitting: Psychiatry

## 2022-02-05 ENCOUNTER — Other Ambulatory Visit: Payer: Self-pay

## 2022-02-05 DIAGNOSIS — F4310 Post-traumatic stress disorder, unspecified: Secondary | ICD-10-CM | POA: Diagnosis not present

## 2022-02-05 DIAGNOSIS — F3341 Major depressive disorder, recurrent, in partial remission: Secondary | ICD-10-CM | POA: Diagnosis not present
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
## 2022-02-05 NOTE — Plan of Care (Signed)
Patient participated in development of plan ?

## 2022-02-18 ENCOUNTER — Ambulatory Visit (INDEPENDENT_AMBULATORY_CARE_PROVIDER_SITE_OTHER): Payer: 59 | Admitting: Psychiatry

## 2022-02-18 ENCOUNTER — Other Ambulatory Visit: Payer: Self-pay

## 2022-02-18 DIAGNOSIS — F3341 Major depressive disorder, recurrent, in partial remission: Secondary | ICD-10-CM | POA: Diagnosis not present
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
## 2022-02-19 ENCOUNTER — Inpatient Hospital Stay: Payer: 59

## 2022-02-19 ENCOUNTER — Encounter: Payer: Self-pay | Admitting: Hematology and Oncology

## 2022-02-19 ENCOUNTER — Inpatient Hospital Stay (HOSPITAL_BASED_OUTPATIENT_CLINIC_OR_DEPARTMENT_OTHER): Payer: 59 | Admitting: Hematology and Oncology

## 2022-02-19 ENCOUNTER — Encounter: Payer: Self-pay | Admitting: *Deleted

## 2022-02-19 ENCOUNTER — Telehealth: Payer: Self-pay | Admitting: *Deleted

## 2022-02-19 DIAGNOSIS — C50212 Malignant neoplasm of upper-inner quadrant of left female breast: Secondary | ICD-10-CM

## 2022-02-19 DIAGNOSIS — Z170 Estrogen receptor positive status [ER+]: Secondary | ICD-10-CM

## 2022-02-19 DIAGNOSIS — Z5111 Encounter for antineoplastic chemotherapy: Secondary | ICD-10-CM | POA: Diagnosis not present

## 2022-02-19 LAB — CMP (CANCER CENTER ONLY)
ALT: 8 U/L (ref 0–44)
AST: 10 U/L — ABNORMAL LOW (ref 15–41)
Albumin: 4.3 g/dL (ref 3.5–5.0)
Alkaline Phosphatase: 69 U/L (ref 38–126)
Anion gap: 9 (ref 5–15)
BUN: 17 mg/dL (ref 6–20)
CO2: 23 mmol/L (ref 22–32)
Calcium: 10 mg/dL (ref 8.9–10.3)
Chloride: 108 mmol/L (ref 98–111)
Creatinine: 0.67 mg/dL (ref 0.44–1.00)
GFR, Estimated: >60 mL/min (ref >60)
Glucose, Bld: 114 mg/dL — ABNORMAL HIGH (ref 70–99)
Potassium: 3.7 mmol/L (ref 3.5–5.1)
Sodium: 140 mmol/L (ref 135–145)
Total Bilirubin: 0.3 mg/dL (ref 0.3–1.2)
Total Protein: 7.8 g/dL (ref 6.5–8.1)

## 2022-02-19 LAB — CBC WITH DIFFERENTIAL (CANCER CENTER ONLY)
Abs Immature Granulocytes: 0.04 10*3/uL (ref 0.00–0.07)
Basophils Absolute: 0 10*3/uL (ref 0.0–0.1)
Basophils Relative: 0 %
Eosinophils Absolute: 0 10*3/uL (ref 0.0–0.5)
Eosinophils Relative: 0 %
HCT: 39.5 % (ref 36.0–46.0)
Hemoglobin: 13.4 g/dL (ref 12.0–15.0)
Immature Granulocytes: 0 %
Lymphocytes Relative: 10 %
Lymphs Abs: 1.1 10*3/uL (ref 0.7–4.0)
MCH: 31.1 pg (ref 26.0–34.0)
MCHC: 33.9 g/dL (ref 30.0–36.0)
MCV: 91.6 fL (ref 80.0–100.0)
Monocytes Absolute: 0.5 10*3/uL (ref 0.1–1.0)
Monocytes Relative: 4 %
Neutro Abs: 9.8 10*3/uL — ABNORMAL HIGH (ref 1.7–7.7)
Neutrophils Relative %: 86 %
Platelet Count: 327 10*3/uL (ref 150–400)
RBC: 4.31 MIL/uL (ref 3.87–5.11)
RDW: 13.4 % (ref 11.5–15.5)
WBC Count: 11.4 10*3/uL — ABNORMAL HIGH (ref 4.0–10.5)
nRBC: 0 % (ref 0.0–0.2)

## 2022-02-19 MED ORDER — SODIUM CHLORIDE 0.9 % IV SOLN
600.0000 mg/m2 | Freq: Once | INTRAVENOUS | Status: AC
  Administered 2022-02-19: 1280 mg via INTRAVENOUS
  Filled 2022-02-19: qty 64

## 2022-02-19 MED ORDER — SODIUM CHLORIDE 0.9 % IV SOLN
10.0000 mg | Freq: Once | INTRAVENOUS | Status: AC
  Administered 2022-02-19: 10 mg via INTRAVENOUS
  Filled 2022-02-19: qty 10

## 2022-02-19 MED ORDER — SODIUM CHLORIDE 0.9 % IV SOLN
75.0000 mg/m2 | Freq: Once | INTRAVENOUS | Status: AC
  Administered 2022-02-19: 160 mg via INTRAVENOUS
  Filled 2022-02-19: qty 16

## 2022-02-19 MED ORDER — SODIUM CHLORIDE 0.9 % IV SOLN: Freq: Once | INTRAVENOUS | Status: AC

## 2022-02-19 MED ORDER — PALONOSETRON HCL INJECTION 0.25 MG/5ML
0.2500 mg | Freq: Once | INTRAVENOUS | Status: AC
  Administered 2022-02-19: 0.25 mg via INTRAVENOUS
  Filled 2022-02-19: qty 5

## 2022-02-19 NOTE — Assessment & Plan Note (Signed)
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  Given tumor size close to 3 cm, we have discussed about TC neoadjuvantly. ?She completed 2 cycles of chemotherapy so far and has been tolerating it remarkably well.  Physical examination today with significant reduction in tumor size, it measured around over a centimeter today and was much difficult to find during the initial exam.  No palpable lymphadenopathy.  So far impressive response, okay to proceed with cycle 3 as planned today ?

## 2022-02-19 NOTE — Progress Notes (Signed)
Patient doing well today per her assessment- no significant issues. The one thing that was atypical about her assessment was a small area on the skin of her inner, left arm just below her AC. Delayed irritation developed several days after her last infusion and patient itched at it through the superficial skin. It is healing/healed and there is no tenderness at the sight or above it. This is minor discoloration along the vein area. The skin is peel-y. ? ?Maria Diaz was sent over to assess the site/give patient the doctor's suggestion for treatment. Patient understands- ?

## 2022-02-19 NOTE — Progress Notes (Signed)
[facility] Follow up: ?  ? ?[name], MD ?[address] [place] [address] ?[place] [address] ? ? ?DIAGNOSIS:  Cancer Staging  ?Malignant neoplasm of upper-inner quadrant of left breast in female, estrogen receptor positive ([place]) ?Staging form: Breast, AJCC 8th Edition ?- Clinical: Stage IIB (cT2, cN0, cM0, G3, ER+, PR-, HER2-) - Unsigned ?Stage prefix: Initial diagnosis ?Histologic grading system: 3 grade system ? ? ?SUMMARY OF ONCOLOGIC HISTORY: ?Oncology History  ?Malignant neoplasm of upper-inner quadrant of left breast in female, estrogen receptor positive ([place])  ?12/31/2021 Initial Diagnosis  ? Malignant neoplasm of upper-inner quadrant of left breast in female, estrogen receptor positive ([place]) ?  ?01/08/2022 -  Chemotherapy  ? Patient is on Treatment Plan : BREAST TC q21d  ?   ? ? ?CURRENT THERAPY: Taxotere/Cytoxan ? ?INTERVAL HISTORY: ? ?Maria Diaz 51 y.o. female returns for evaluation of stage IIb left breast cancer here today for neoadjuvant chemotherapy.  Today she will receive cycle 3 of therapy.   ? ?Patient is doing very well today.  She denies any new complaints.  She has been tolerating chemotherapy extremely well.  No tingling or numbness of hands or feet or neuropathy reported.  She is very impressed that her breast tumor has been shrinking steadily.  She lost her hair after the second cycle of chemotherapy.  No change in breathing or bowel habits or urinary habits.  Rest of the pertinent 10 point ROS reviewed and negative. ? ?Patient Active Problem List  ? Diagnosis Date Noted  ? Anemia 12/31/2021  ? Malignant neoplasm of upper-inner quadrant of left breast in female, estrogen receptor positive ([place]) 12/31/2021  ? Open angle with borderline findings and low glaucoma risk in both eyes 01/11/2016  ? Chronic idiopathic urticaria 01/11/2016  ? Age-related nuclear cataract of both eyes 01/11/2016  ? Eczema 01/11/2016  ? ? ?is allergic to haemophilus influenzae vaccines and  penicillins. ? ?MEDICAL HISTORY: ?Past Medical History:  ?Diagnosis Date  ? Anemia   ? Anxiety   ? Depression   ? Glaucoma   ? ? ?SURGICAL HISTORY: ?Past Surgical History:  ?Procedure Laterality Date  ? BREAST BIOPSY Left 01/19/2022  ? BREAST CYST EXCISION Left 01/28/2021  ? Procedure: LEFT BREAST MASS EXCISION;  Surgeon: Rolm Bookbinder, MD;  Location: [facility];  Service: General;  Laterality: Left;  START TIME OF 3:00 PM FOR 60 MINUTES IN ROOM 8  ? HYSTEROSCOPY W/ ENDOMETRIAL ABLATION  08/14/2020  ? [facility]  ? ? ?SOCIAL HISTORY: ?Social History  ? ?Socioeconomic History  ? Marital status: Divorced  ?  Spouse name: Not on file  ? Number of children: Not on file  ? Years of education: Not on file  ? Highest education level: Not on file  ?Occupational History  ? Not on file  ?Tobacco Use  ? Smoking status: Never  ? Smokeless tobacco: Never  ?Substance and Sexual Activity  ? Alcohol use: Yes  ?  Alcohol/week: 1.0 standard drink  ?  Types: 1 Glasses of wine per week  ?  Comment: holidays  ? Drug use: Never  ? Sexual activity: Yes  ?  Birth control/protection: None  ?Other Topics Concern  ? Not on file  ?Social History Narrative  ? Not on file  ? ?Social Determinants of Health  ? ?Financial Resource Strain: Not on file  ?Food Insecurity: Not on file  ?Transportation Needs: Not on file  ?Physical Activity: Not on file  ?Stress: Not on  file  ?Social Connections: Not on file  ?Intimate Partner Violence: Not on file  ? ? ?FAMILY HISTORY: ?Family History  ?Problem Relation Age of Onset  ? Anxiety disorder Sister   ? Drug abuse Mother   ? ? ?Review of Systems  ?Constitutional:  Negative for appetite change, chills, fatigue, fever and unexpected weight change.  ?HENT:   Negative for hearing loss, lump/mass and trouble swallowing.   ?Eyes:  Negative for eye problems and icterus.  ?Respiratory:  Negative for chest tightness, cough and shortness of breath.   ?Cardiovascular:  Negative for chest pain, leg swelling  and palpitations.  ?Gastrointestinal:  Negative for abdominal distention, abdominal pain, constipation, diarrhea, nausea and vomiting.  ?Endocrine: Negative for hot flashes.  ?Genitourinary:  Negative for difficulty urinating.   ?Musculoskeletal:  Negative for arthralgias.  ?Skin:  Negative for itching and rash.  ?Neurological:  Negative for dizziness, extremity weakness, headaches and numbness.  ?Hematological:  Negative for adenopathy. Does not bruise/bleed easily.  ?Psychiatric/Behavioral:  Negative for depression. The patient is not nervous/anxious.    ? ? ?PHYSICAL EXAMINATION ? ?ECOG PERFORMANCE STATUS: 1 - Symptomatic but completely ambulatory ? ?Vitals:  ? 02/19/22 0806  ?BP: 127/71  ?Pulse: 76  ?Resp: 16  ?Temp: (!) 97.5 ?F (36.4 ?C)  ?SpO2: 97%  ? ? ?Physical Exam ?Constitutional:   ?   General: She is not in acute distress. ?   Appearance: Normal appearance. She is not toxic-appearing.  ?HENT:  ?   Head: Normocephalic and atraumatic.  ?Eyes:  ?   General: No scleral icterus. ?Cardiovascular:  ?   Rate and Rhythm: Normal rate and regular rhythm.  ?   Pulses: Normal pulses.  ?   Heart sounds: Normal heart sounds.  ?Pulmonary:  ?   Effort: Pulmonary effort is normal.  ?   Breath sounds: Normal breath sounds.  ?Chest:  ?   Comments: Left breast mass with significant decrease in size.  Prior to chemotherapy was measuring 3 to 3-1/2 cm in size and today it measured just over a centimeter. ?Abdominal:  ?   General: Abdomen is flat. Bowel sounds are normal. There is no distension.  ?   Palpations: Abdomen is soft.  ?   Tenderness: There is no abdominal tenderness.  ?Musculoskeletal:     ?   General: No swelling.  ?   Cervical back: Neck supple.  ?Lymphadenopathy:  ?   Cervical: No cervical adenopathy.  ?Skin: ?   General: Skin is warm and dry.  ?   Findings: No rash.  ?Neurological:  ?   General: No focal deficit present.  ?   Mental Status: She is alert.  ?Psychiatric:     ?   Mood and Affect: Mood normal.     ?    Behavior: Behavior normal.  ? ? ?LABORATORY DATA: ? ?CBC ?   ?Component Value Date/Time  ? WBC 11.4 (H) 02/19/2022 0750  ? WBC 8.0 07/20/2020 2320  ? RBC 4.31 02/19/2022 0750  ? HGB 13.4 02/19/2022 0750  ? HCT 39.5 02/19/2022 0750  ? PLT 327 02/19/2022 0750  ? MCV 91.6 02/19/2022 0750  ? MCH 31.1 02/19/2022 0750  ? MCHC 33.9 02/19/2022 0750  ? RDW 13.4 02/19/2022 0750  ? LYMPHSABS 1.1 02/19/2022 0750  ? MONOABS 0.5 02/19/2022 0750  ? EOSABS 0.0 02/19/2022 0750  ? BASOSABS 0.0 02/19/2022 0750  ? ? ?CMP  ?   ?Component Value Date/Time  ? NA 140 02/19/2022 0750  ? K  3.7 02/19/2022 0750  ? CL 108 02/19/2022 0750  ? CO2 23 02/19/2022 0750  ? GLUCOSE 114 (H) 02/19/2022 0750  ? BUN 17 02/19/2022 0750  ? CREATININE 0.67 02/19/2022 0750  ? CALCIUM 10.0 02/19/2022 0750  ? PROT 7.8 02/19/2022 0750  ? ALBUMIN 4.3 02/19/2022 0750  ? AST 10 (L) 02/19/2022 0750  ? ALT 8 02/19/2022 0750  ? ALKPHOS 69 02/19/2022 0750  ? BILITOT 0.3 02/19/2022 0750  ? GFRNONAA >60 02/19/2022 0750  ? GFRAA >60 07/20/2020 2320  ? ? ? ? ? ?ASSESSMENT and THERAPY PLAN:  ? 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  Given tumor size close to 3 cm, we have discussed about TC neoadjuvantly. ?She completed 2 cycles of chemotherapy so far and has been tolerating it remarkably well.  Physical examination today with significant reduction in tumor size, it measured around over a centimeter today and was much difficult to find during the initial exam.  No palpable lymphadenopathy.  So far impressive response, okay to proceed with cycle 3 as planned today ? ?I was  then called saying that she has some skin changes at the site of infusion and infusion nurses were a bit concerned.   ?Our nurse Val has examined it, patient apparently denied any tenderness, erythema and she

## 2022-02-19 NOTE — Patient Instructions (Signed)
[facility]  Discharge Instructions: ?Thank you for choosing [facility] to provide your oncology and hematology care.  ? ?If you have a lab appointment with the [facility], please go directly to the [facility] and check in at the registration area. ?  ?Wear comfortable clothing and clothing appropriate for easy access to any Portacath or PICC line.  ? ?We strive to give you quality time with your provider. You may need to reschedule your appointment if you arrive late (15 or more minutes).  Arriving late affects you and other patients whose appointments are after yours.  Also, if you miss three or more appointments without notifying the office, you may be dismissed from the clinic at the provider?s discretion.    ?  ?For prescription refill requests, have your pharmacy contact our office and allow 72 hours for refills to be completed.   ? ?Today you received the following chemotherapy and/or immunotherapy agents: Docetaxel and Cyclophosphamide     ?  ?To help prevent nausea and vomiting after your treatment, we encourage you to take your nausea medication as directed. ? ?BELOW ARE SYMPTOMS THAT SHOULD BE REPORTED IMMEDIATELY: ?*FEVER GREATER THAN 100.4 F (38 ?C) OR HIGHER ?*CHILLS OR SWEATING ?*NAUSEA AND VOMITING THAT IS NOT CONTROLLED WITH YOUR NAUSEA MEDICATION ?*UNUSUAL SHORTNESS OF BREATH ?*UNUSUAL BRUISING OR BLEEDING ?*URINARY PROBLEMS (pain or burning when urinating, or frequent urination) ?*BOWEL PROBLEMS (unusual diarrhea, constipation, pain near the anus) ?TENDERNESS IN MOUTH AND THROAT WITH OR WITHOUT PRESENCE OF ULCERS (sore throat, sores in mouth, or a toothache) ?UNUSUAL RASH, SWELLING OR PAIN  ?UNUSUAL VAGINAL DISCHARGE OR ITCHING  ? ?Items with * indicate a potential emergency and should be followed up as soon as possible or go to the Emergency Department if any problems should occur. ? ?Please show the CHEMOTHERAPY ALERT CARD or IMMUNOTHERAPY  ALERT CARD at check-in to the Emergency Department and triage nurse. ? ?Should you have questions after your visit or need to cancel or reschedule your appointment, please contact [facility]  Dept: [phone]  and follow the prompts.  Office hours are 8:00 a.m. to 4:30 p.m. Monday - Friday. Please note that voicemails left after 4:00 p.m. may not be returned until the following business day.  We are closed weekends and major holidays. You have access to a nurse at all times for urgent questions. Please call the main number to the clinic Dept: [phone] and follow the prompts. ? ? ?For any non-urgent questions, you may also contact your provider using MyChart. We now offer e-Visits for anyone 56 and older to request care online for non-urgent symptoms. For details visit [email or web address]. ?  ?Also download the MyChart app! Go to the app store, search "MyChart", open the app, select [facility], and log in with your MyChart username and password. ? ?Due to Covid, a mask is required upon entering the hospital/clinic. If you do not have a mask, one will be given to you upon arrival. For doctor visits, patients may have 1 support person aged 72 or older with them. For treatment visits, patients cannot have anyone with them due to current Covid guidelines and our immunocompromised population.  ? ?

## 2022-02-19 NOTE — Telephone Encounter (Signed)
This RN evaluated noted prior IV site showing an area of mild skin excoriation with good healing. ?Noted darkening of vein sporadically above insertion site for approximately 2 inches. ? ?Area is non tender to touch - no swelling or redness. ?Area does not compromise any movement or use of arm. ? ?This RN informed pt to apply hydrocortisone ( OTC strength ) and cover with saran wrap twice a day. ? ?Pt verbalized understanding. ? ?MD made aware of above with no further recommendations. ?

## 2022-02-20 ENCOUNTER — Telehealth: Payer: Self-pay | Admitting: Hematology and Oncology

## 2022-02-20 NOTE — Telephone Encounter (Signed)
Spoke with patient to give upcoming appointment for Breast MRI ?

## 2022-02-21 ENCOUNTER — Inpatient Hospital Stay: Payer: 59 | Attending: Hematology and Oncology

## 2022-02-21 VITALS — BP 138/68 | HR 82 | Temp 98.2°F | Resp 18 | Ht 67.0 in

## 2022-02-21 DIAGNOSIS — C50212 Malignant neoplasm of upper-inner quadrant of left female breast: Secondary | ICD-10-CM | POA: Diagnosis present

## 2022-02-21 DIAGNOSIS — Z170 Estrogen receptor positive status [ER+]: Secondary | ICD-10-CM | POA: Diagnosis not present

## 2022-02-21 DIAGNOSIS — Z5111 Encounter for antineoplastic chemotherapy: Secondary | ICD-10-CM | POA: Diagnosis present

## 2022-02-21 DIAGNOSIS — Z5189 Encounter for other specified aftercare: Secondary | ICD-10-CM | POA: Insufficient documentation

## 2022-02-21 MED ORDER — PEGFILGRASTIM-BMEZ 6 MG/0.6ML ~~LOC~~ SOSY
6.0000 mg | PREFILLED_SYRINGE | Freq: Once | SUBCUTANEOUS | Status: AC
  Administered 2022-02-21: 6 mg via SUBCUTANEOUS

## 2022-02-21 NOTE — Patient Instructions (Signed)

## 2022-02-23 ENCOUNTER — Telehealth: Payer: Self-pay | Admitting: *Deleted

## 2022-02-23 NOTE — Telephone Encounter (Signed)
Spoke with patient to inform her of an appt with Dr. Donne Hazel for 4/26 at 245. Patient verbalized understanding.  ?

## 2022-02-25 ENCOUNTER — Encounter: Payer: Self-pay | Admitting: *Deleted

## 2022-02-25 DIAGNOSIS — C50212 Malignant neoplasm of upper-inner quadrant of left female breast: Secondary | ICD-10-CM
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
## 2022-03-06 ENCOUNTER — Telehealth (INDEPENDENT_AMBULATORY_CARE_PROVIDER_SITE_OTHER): Payer: 59 | Admitting: Psychiatry

## 2022-03-06 ENCOUNTER — Encounter: Payer: Self-pay | Admitting: Psychiatry

## 2022-03-06 DIAGNOSIS — F4310 Post-traumatic stress disorder, unspecified: Secondary | ICD-10-CM | POA: Diagnosis not present

## 2022-03-06 DIAGNOSIS — F3341 Major depressive disorder, recurrent, in partial remission: Secondary | ICD-10-CM | POA: Diagnosis not present

## 2022-03-06 MED ORDER — HYDROXYZINE HCL 25 MG PO TABS: 25.0000 mg | ORAL_TABLET | Freq: Every day | ORAL | 0 refills | Status: DC | PRN

## 2022-03-06 NOTE — Patient Instructions (Signed)
Continue sertraline 200 mg daily  ?Continue hydroxyzine 25 mg daily as needed for anxiety  ?Continue prazosin 2 mg at night ?Next appointment- 5/26 at 10:30, video ?

## 2022-03-06 NOTE — Addendum Note (Signed)
Addended by: [name] on: 03/06/2022 10:37 AM ? ? Modules accepted: Orders ? ?

## 2022-03-12 ENCOUNTER — Encounter: Payer: Self-pay | Admitting: Hematology and Oncology

## 2022-03-12 ENCOUNTER — Inpatient Hospital Stay: Payer: 59

## 2022-03-12 ENCOUNTER — Inpatient Hospital Stay: Payer: 59 | Admitting: Hematology and Oncology

## 2022-03-12 ENCOUNTER — Encounter: Payer: Self-pay | Admitting: *Deleted

## 2022-03-12 ENCOUNTER — Other Ambulatory Visit: Payer: Self-pay

## 2022-03-12 DIAGNOSIS — Z170 Estrogen receptor positive status [ER+]: Secondary | ICD-10-CM | POA: Diagnosis not present

## 2022-03-12 DIAGNOSIS — C50212 Malignant neoplasm of upper-inner quadrant of left female breast: Secondary | ICD-10-CM

## 2022-03-12 DIAGNOSIS — Z5111 Encounter for antineoplastic chemotherapy: Secondary | ICD-10-CM | POA: Diagnosis not present

## 2022-03-12 LAB — CMP (CANCER CENTER ONLY)
ALT: 9 U/L (ref 0–44)
AST: 11 U/L — ABNORMAL LOW (ref 15–41)
Albumin: 4.2 g/dL (ref 3.5–5.0)
Alkaline Phosphatase: 71 U/L (ref 38–126)
Anion gap: 5 (ref 5–15)
BUN: 15 mg/dL (ref 6–20)
CO2: 24 mmol/L (ref 22–32)
Calcium: 9.8 mg/dL (ref 8.9–10.3)
Chloride: 111 mmol/L (ref 98–111)
Creatinine: 0.71 mg/dL (ref 0.44–1.00)
GFR, Estimated: >60 mL/min (ref >60)
Glucose, Bld: 107 mg/dL — ABNORMAL HIGH (ref 70–99)
Potassium: 3.8 mmol/L (ref 3.5–5.1)
Sodium: 140 mmol/L (ref 135–145)
Total Bilirubin: 0.3 mg/dL (ref 0.3–1.2)
Total Protein: 7.7 g/dL (ref 6.5–8.1)

## 2022-03-12 LAB — CBC WITH DIFFERENTIAL (CANCER CENTER ONLY)
Abs Immature Granulocytes: 0.04 10*3/uL (ref 0.00–0.07)
Basophils Absolute: 0 10*3/uL (ref 0.0–0.1)
Basophils Relative: 0 %
Eosinophils Absolute: 0 10*3/uL (ref 0.0–0.5)
Eosinophils Relative: 0 %
HCT: 37.8 % (ref 36.0–46.0)
Hemoglobin: 12.7 g/dL (ref 12.0–15.0)
Immature Granulocytes: 0 %
Lymphocytes Relative: 10 %
Lymphs Abs: 1.2 10*3/uL (ref 0.7–4.0)
MCH: 30.8 pg (ref 26.0–34.0)
MCHC: 33.6 g/dL (ref 30.0–36.0)
MCV: 91.5 fL (ref 80.0–100.0)
Monocytes Absolute: 0.9 10*3/uL (ref 0.1–1.0)
Monocytes Relative: 7 %
Neutro Abs: 10 10*3/uL — ABNORMAL HIGH (ref 1.7–7.7)
Neutrophils Relative %: 83 %
Platelet Count: 314 10*3/uL (ref 150–400)
RBC: 4.13 MIL/uL (ref 3.87–5.11)
RDW: 14.4 % (ref 11.5–15.5)
WBC Count: 12.1 10*3/uL — ABNORMAL HIGH (ref 4.0–10.5)
nRBC: 0 % (ref 0.0–0.2)

## 2022-03-12 MED ORDER — SODIUM CHLORIDE 0.9 % IV SOLN
75.0000 mg/m2 | Freq: Once | INTRAVENOUS | Status: AC
  Administered 2022-03-12: 160 mg via INTRAVENOUS
  Filled 2022-03-12: qty 16

## 2022-03-12 MED ORDER — SODIUM CHLORIDE 0.9 % IV SOLN: Freq: Once | INTRAVENOUS | Status: AC

## 2022-03-12 MED ORDER — SODIUM CHLORIDE 0.9 % IV SOLN
600.0000 mg/m2 | Freq: Once | INTRAVENOUS | Status: AC
  Administered 2022-03-12: 1280 mg via INTRAVENOUS
  Filled 2022-03-12: qty 64

## 2022-03-12 MED ORDER — SODIUM CHLORIDE 0.9 % IV SOLN
10.0000 mg | Freq: Once | INTRAVENOUS | Status: AC
  Administered 2022-03-12: 10 mg via INTRAVENOUS
  Filled 2022-03-12: qty 10

## 2022-03-12 MED ORDER — PALONOSETRON HCL INJECTION 0.25 MG/5ML
0.2500 mg | Freq: Once | INTRAVENOUS | Status: AC
  Administered 2022-03-12: 0.25 mg via INTRAVENOUS
  Filled 2022-03-12: qty 5

## 2022-03-12 NOTE — Patient Instructions (Signed)
[facility]  Discharge Instructions: ?Thank you for choosing [facility] to provide your oncology and hematology care.  ? ?If you have a lab appointment with the [facility], please go directly to the [facility] and check in at the registration area. ?  ?Wear comfortable clothing and clothing appropriate for easy access to any Portacath or PICC line.  ? ?We strive to give you quality time with your provider. You may need to reschedule your appointment if you arrive late (15 or more minutes).  Arriving late affects you and other patients whose appointments are after yours.  Also, if you miss three or more appointments without notifying the office, you may be dismissed from the clinic at the provider?s discretion.    ?  ?For prescription refill requests, have your pharmacy contact our office and allow 72 hours for refills to be completed.   ? ?Today you received the following chemotherapy and/or immunotherapy agents: Docetaxel and Cyclophosphamide     ?  ?To help prevent nausea and vomiting after your treatment, we encourage you to take your nausea medication as directed. ? ?BELOW ARE SYMPTOMS THAT SHOULD BE REPORTED IMMEDIATELY: ?*FEVER GREATER THAN 100.4 F (38 ?C) OR HIGHER ?*CHILLS OR SWEATING ?*NAUSEA AND VOMITING THAT IS NOT CONTROLLED WITH YOUR NAUSEA MEDICATION ?*UNUSUAL SHORTNESS OF BREATH ?*UNUSUAL BRUISING OR BLEEDING ?*URINARY PROBLEMS (pain or burning when urinating, or frequent urination) ?*BOWEL PROBLEMS (unusual diarrhea, constipation, pain near the anus) ?TENDERNESS IN MOUTH AND THROAT WITH OR WITHOUT PRESENCE OF ULCERS (sore throat, sores in mouth, or a toothache) ?UNUSUAL RASH, SWELLING OR PAIN  ?UNUSUAL VAGINAL DISCHARGE OR ITCHING  ? ?Items with * indicate a potential emergency and should be followed up as soon as possible or go to the Emergency Department if any problems should occur. ? ?Please show the CHEMOTHERAPY ALERT CARD or IMMUNOTHERAPY  ALERT CARD at check-in to the Emergency Department and triage nurse. ? ?Should you have questions after your visit or need to cancel or reschedule your appointment, please contact [facility]  Dept: [phone]  and follow the prompts.  Office hours are 8:00 a.m. to 4:30 p.m. Monday - Friday. Please note that voicemails left after 4:00 p.m. may not be returned until the following business day.  We are closed weekends and major holidays. You have access to a nurse at all times for urgent questions. Please call the main number to the clinic Dept: [phone] and follow the prompts. ? ? ?For any non-urgent questions, you may also contact your provider using MyChart. We now offer e-Visits for anyone 100 and older to request care online for non-urgent symptoms. For details visit [email or web address]. ?  ?Also download the MyChart app! Go to the app store, search "MyChart", open the app, select [facility], and log in with your MyChart username and password. ? ?Due to Covid, a mask is required upon entering the hospital/clinic. If you do not have a mask, one will be given to you upon arrival. For doctor visits, patients may have 1 support person aged 53 or older with them. For treatment visits, patients cannot have anyone with them due to current Covid guidelines and our immunocompromised population.  ? ?

## 2022-03-12 NOTE — Progress Notes (Signed)
[facility] Follow up: ?  ? ?Maria Rio, MD ?[address] [place] [address] ?[place] [address] ? ? ?DIAGNOSIS:  Cancer Staging  ?Malignant neoplasm of upper-inner quadrant of left breast in female, estrogen receptor positive ([facility]) ?Staging form: Breast, AJCC 8th Edition ?- Clinical: Stage IIB (cT2, cN0, cM0, G3, ER+, PR-, HER2-) - Unsigned ?Stage prefix: Initial diagnosis ?Histologic grading system: 3 grade system ? ? ?SUMMARY OF ONCOLOGIC HISTORY: ?Oncology History  ?Malignant neoplasm of upper-inner quadrant of left breast in female, estrogen receptor positive ([facility])  ?12/31/2021 Initial Diagnosis  ? Malignant neoplasm of upper-inner quadrant of left breast in female, estrogen receptor positive ([facility]) ? ?  ?01/08/2022 -  Chemotherapy  ? Patient is on Treatment Plan : BREAST TC q21d  ? ?  ?  ? ? ?CURRENT THERAPY: Taxotere/Cytoxan ? ?INTERVAL HISTORY: ? ?Maria Diaz 51 y.o. female returns for evaluation of stage IIb left breast cancer here today for neoadjuvant chemotherapy.  She is here before C4 of TC. ? ?She had some chills on Easter Sunday, no fevers. ?She was also feeling fatigued. ?No neuropathy. ?NO nausea, vomiting and diarrhea. ?Rest of the pertinent 10 point ROS reviewed and negative. ? ?Patient Active Problem List  ? Diagnosis Date Noted  ? Anemia 12/31/2021  ? Malignant neoplasm of upper-inner quadrant of left breast in female, estrogen receptor positive ([facility]) 12/31/2021  ? Open angle with borderline findings and low glaucoma risk in both eyes 01/11/2016  ? Chronic idiopathic urticaria 01/11/2016  ? Age-related nuclear cataract of both eyes 01/11/2016  ? Eczema 01/11/2016  ? ? ?is allergic to haemophilus influenzae vaccines and penicillins. ? ?MEDICAL HISTORY: ?Past Medical History:  ?Diagnosis Date  ? Anemia   ? Anxiety   ? Depression   ? Glaucoma   ? ? ?SURGICAL HISTORY: ?Past Surgical History:  ?Procedure Laterality Date  ? BREAST BIOPSY Left 01/19/2022  ? BREAST CYST EXCISION Left  01/28/2021  ? Procedure: LEFT BREAST MASS EXCISION;  Surgeon: Rolm Bookbinder, MD;  Location: [facility];  Service: General;  Laterality: Left;  START TIME OF 3:00 PM FOR 60 MINUTES IN ROOM 8  ? HYSTEROSCOPY W/ ENDOMETRIAL ABLATION  08/14/2020  ? [facility]  ? ? ?SOCIAL HISTORY: ?Social History  ? ?Socioeconomic History  ? Marital status: Divorced  ?  Spouse name: Not on file  ? Number of children: Not on file  ? Years of education: Not on file  ? Highest education level: Not on file  ?Occupational History  ? Not on file  ?Tobacco Use  ? Smoking status: Never  ? Smokeless tobacco: Never  ?Substance and Sexual Activity  ? Alcohol use: Yes  ?  Alcohol/week: 1.0 standard drink  ?  Types: 1 Glasses of wine per week  ?  Comment: holidays  ? Drug use: Never  ? Sexual activity: Yes  ?  Birth control/protection: None  ?Other Topics Concern  ? Not on file  ?Social History Narrative  ? Not on file  ? ?Social Determinants of Health  ? ?Financial Resource Strain: Not on file  ?Food Insecurity: Not on file  ?Transportation Needs: Not on file  ?Physical Activity: Not on file  ?Stress: Not on file  ?Social Connections: Not on file  ?Intimate Partner Violence: Not on file  ? ? ?FAMILY HISTORY: ?Family History  ?Problem Relation Age of Onset  ? Anxiety disorder Sister   ? Drug abuse Mother   ? ? ?Review of Systems  ?Constitutional:  Negative for appetite change, chills, fatigue, fever and unexpected weight change.  ?HENT:   Negative for hearing loss, lump/mass and trouble swallowing.   ?Eyes:  Negative for eye problems and icterus.  ?Respiratory:  Negative for chest tightness, cough and shortness of breath.   ?Cardiovascular:  Negative for chest pain, leg swelling and palpitations.  ?Gastrointestinal:  Negative for abdominal distention, abdominal pain, constipation, diarrhea, nausea and vomiting.  ?Endocrine: Negative for hot flashes.  ?Genitourinary:  Negative for difficulty urinating.   ?Musculoskeletal:  Negative for  arthralgias.  ?Skin:  Negative for itching and rash.  ?Neurological:  Negative for dizziness, extremity weakness, headaches and numbness.  ?Hematological:  Negative for adenopathy. Does not bruise/bleed easily.  ?Psychiatric/Behavioral:  Negative for depression. The patient is not nervous/anxious.    ? ? ?PHYSICAL EXAMINATION ? ?ECOG PERFORMANCE STATUS: 1 - Symptomatic but completely ambulatory ? ?Vitals:  ? 03/12/22 0841  ?BP: (!) 148/79  ?Pulse: 81  ?Resp: 17  ?Temp: 97.8 ?F (36.6 ?C)  ?SpO2: 96%  ? ? ?Physical Exam ?Constitutional:   ?   General: She is not in acute distress. ?   Appearance: Normal appearance. She is not toxic-appearing.  ?HENT:  ?   Head: Normocephalic and atraumatic.  ?Eyes:  ?   General: No scleral icterus. ?Cardiovascular:  ?   Rate and Rhythm: Normal rate and regular rhythm.  ?   Pulses: Normal pulses.  ?   Heart sounds: Normal heart sounds.  ?Pulmonary:  ?   Effort: Pulmonary effort is normal.  ?   Breath sounds: Normal breath sounds.  ?Chest:  ? ? ?   Comments: Left breast mass barely palpable on exam today. ?No palpable lymphadenopathy ?Abdominal:  ?   General: Abdomen is flat. Bowel sounds are normal. There is no distension.  ?   Palpations: Abdomen is soft.  ?   Tenderness: There is no abdominal tenderness.  ?Musculoskeletal:     ?   General: No swelling.  ?   Cervical back: Neck supple.  ?Lymphadenopathy:  ?   Cervical: No cervical adenopathy.  ?Skin: ?   General: Skin is warm and dry.  ?   Findings: No rash.  ?Neurological:  ?   General: No focal deficit present.  ?   Mental Status: She is alert.  ?Psychiatric:     ?   Mood and Affect: Mood normal.     ?   Behavior: Behavior normal.  ? ? ?LABORATORY DATA: ? ?CBC ?   ?Component Value Date/Time  ? WBC 12.1 (H) 03/12/2022 0826  ? WBC 8.0 07/20/2020 2320  ? RBC 4.13 03/12/2022 0826  ? HGB 12.7 03/12/2022 0826  ? HCT 37.8 03/12/2022 0826  ? PLT 314 03/12/2022 0826  ? MCV 91.5 03/12/2022 0826  ? MCH 30.8 03/12/2022 0826  ? MCHC 33.6  03/12/2022 0826  ? RDW 14.4 03/12/2022 0826  ? LYMPHSABS 1.2 03/12/2022 0826  ? MONOABS 0.9 03/12/2022 0826  ? EOSABS 0.0 03/12/2022 0826  ? BASOSABS 0.0 03/12/2022 0826  ? ? ?CMP  ?   ?Component Value Date/Time  ? NA 140 03/12/2022 0826  ? K 3.8 03/12/2022 0826  ? CL 111 03/12/2022 0826  ? CO2 24 03/12/2022 0826  ? GLUCOSE 107 (H) 03/12/2022 0826  ? BUN 15 03/12/2022 0826  ? CREATININE 0.71 03/12/2022 0826  ? CALCIUM 9.8 03/12/2022 0826  ? PROT 7.7 03/12/2022 0826  ? ALBUMIN 4.2 03/12/2022 0826  ? AST 11 (L) 03/12/2022 0826  ? ALT 9  03/12/2022 0826  ? ALKPHOS 71 03/12/2022 0826  ? BILITOT 0.3 03/12/2022 0826  ? GFRNONAA >60 03/12/2022 0826  ? GFRAA >60 07/20/2020 2320  ? ? ?ASSESSMENT and THERAPY PLAN:  ? 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  Given tumor size close to 3 cm, we have discussed about TC neoadjuvantly. ?She completed 3 cycles of chemotherapy and is here before cycle 4 of TC.  Remarkable response in the tumor, tumor barely palpable at this time.  No palpable regional adenopathy. ?Okay to proceed with chemotherapy as scheduled today.  She will follow-up with Dr. Donne Hazel after chemotherapy for consideration of surgery and she will return to clinic in about 4 to 6 weeks for follow-up. ? ? ?*Total Encounter Time as defined by the Centers for Medicare and Medicaid Services includes, in addition to the face-to-face time of a patient visit (documented in the note above) non-face-to-face time: obtaining and reviewing outside history, ordering and reviewing medications, tests or procedures, care  coordination (communications with other health care professionals or caregivers) and documentation in the medical record. ? ?

## 2022-03-12 NOTE — Assessment & Plan Note (Signed)
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  Given tumor size close to 3 cm, we have discussed about TC neoadjuvantly. ?She completed 3 cycles of chemotherapy and is here before cycle 4 of TC.  Remarkable response in the tumor, tumor barely palpable at this time.  No palpable regional adenopathy. ?Okay to proceed with chemotherapy as scheduled today.  She will follow-up with Dr. Donne Hazel after chemotherapy for consideration of surgery and she will return to clinic in about 4 to 6 weeks for follow-up. ?

## 2022-03-14 ENCOUNTER — Inpatient Hospital Stay: Payer: 59

## 2022-03-14 VITALS — BP 120/73 | HR 69 | Temp 97.5°F | Resp 16

## 2022-03-14 DIAGNOSIS — Z5111 Encounter for antineoplastic chemotherapy: Secondary | ICD-10-CM | POA: Diagnosis not present

## 2022-03-14 DIAGNOSIS — C50212 Malignant neoplasm of upper-inner quadrant of left female breast: Secondary | ICD-10-CM

## 2022-03-14 MED ORDER — PEGFILGRASTIM-BMEZ 6 MG/0.6ML ~~LOC~~ SOSY
6.0000 mg | PREFILLED_SYRINGE | Freq: Once | SUBCUTANEOUS | Status: AC
  Administered 2022-03-14: 6 mg via SUBCUTANEOUS
  Filled 2022-03-14: qty 0.6

## 2022-03-16 ENCOUNTER — Encounter (HOSPITAL_COMMUNITY): Payer: Self-pay

## 2022-03-16 ENCOUNTER — Ambulatory Visit (HOSPITAL_COMMUNITY): Payer: 59

## 2022-03-17 ENCOUNTER — Encounter: Payer: Self-pay | Admitting: *Deleted

## 2022-03-17 DIAGNOSIS — C50212 Malignant neoplasm of upper-inner quadrant of left female breast: Secondary | ICD-10-CM

## 2022-03-19 ENCOUNTER — Encounter: Payer: Self-pay | Admitting: *Deleted

## 2022-03-19 ENCOUNTER — Ambulatory Visit (INDEPENDENT_AMBULATORY_CARE_PROVIDER_SITE_OTHER): Payer: 59 | Admitting: Psychiatry

## 2022-03-19 DIAGNOSIS — F3341 Major depressive disorder, recurrent, in partial remission: Secondary | ICD-10-CM

## 2022-03-19 DIAGNOSIS — F4310 Post-traumatic stress disorder, unspecified: Secondary | ICD-10-CM | POA: Diagnosis not present

## 2022-03-19 DIAGNOSIS — Z170 Estrogen receptor positive status [ER+]: Secondary | ICD-10-CM
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
## 2022-03-24 ENCOUNTER — Encounter: Payer: Self-pay | Admitting: *Deleted

## 2022-03-24 ENCOUNTER — Telehealth: Payer: Self-pay | Admitting: Hematology and Oncology

## 2022-03-24 NOTE — Telephone Encounter (Signed)
.  Called patient to schedule appointment per 5/1 inbasket, patient is aware of date and time.   ?

## 2022-03-25 NOTE — Progress Notes (Addendum)
Location of Breast Cancer: upper-inner quadrant of left breast  ? ?Histology per Pathology Report:  ?Breast, left, needle core biopsy, upper central, barbell clip ?- INVASIVE DUCTAL CARCINOMA, GRADE 3. ? ?Receptor Status: ER 40% weak, PR negative and HER2 negative KI of 30%. ? ?Did patient present with symptoms (if so, please note symptoms) or was this found on screening mammography?: palpable lump ? ?Past/Anticipated interventions by surgeon, if any:  ?Date of Service: 01/28/2021 ?Procedure: Right breast cyst excisional biopsy ?Surgeon: Dr. Serita Grammes ? ?04/13/2022 ?--Dr. Rolm Bookbinder ?LEFT BREAST BRACKETED LUMPECTOMY WITH RADIOACTIVE SEED AND AXILLARY SENTINEL LYMPH NODE BIOPSY  ? ?04/21/2022 ?--Dr. Irene Limbo ?MASTOPEXY (Bilateral) ? ?Past/Anticipated interventions by medical oncology, if any:  ?Dr Chryl Heck ?01/08/2022 -  Chemotherapy  ?  Patient is on Treatment Plan : BREAST TC q21d  ?  ?  ?   ?CURRENT THERAPY: Taxotere/Cytoxan (final cycle 03/12/2022) ? ?Lymphedema issues, if any:  None   ? ?Pain issues, if any:  Patient denies and reports full range of motion to her left arm/shoulder  ? ?SAFETY ISSUES: ?Prior radiation? No ?Pacemaker/ICD? No ?Possible current pregnancy? No--perimenopausal (taking Provera 10 mg/daily) ?Is the patient on methotrexate? No ? ?Current Complaints / other details:  Nothing else of note ?   ? ? ? ?

## 2022-03-31 NOTE — Progress Notes (Signed)
?Radiation Oncology         [phone] ?________________________________ ? ?Initial Outpatient Consultation ? ?Name: Maria Diaz MRN: [id number]  ?Date: 04/01/2022  DOB: 02-16-1971 ? ?HM:CNOBSJ, Nicole Kindred, MD  Benay Pike, MD  ? ?REFERRING PHYSICIAN: Benay Pike, MD ? ?DIAGNOSIS: The encounter diagnosis was Malignant neoplasm of upper-inner quadrant of left breast in female, estrogen receptor positive ([place]). ? ?Stage IIB (cT2, cN0, cM0) Left Breast UIQ, Invasive Ductal Carcinoma, ER+ / PR- / Her2-, Grade 3, s/p neoadjuvant chemotherapy  ? ?(Lumpectomy scheduled for 04/13/22) ? ?HISTORY OF PRESENT ILLNESS::Maria Diaz is a 51 y.o. female who is accompanied by her sister. she is seen as a courtesy of Dr. Chryl Heck for an opinion concerning radiation therapy as part of management for her recently diagnosed left breast cancer.  ? ?The patient initially presented with a painful left breast month x 8 months this past January 2023. Subsequent diagnostic bilateral mammogram at [facility] on 12/09/21 revealed a cluster of indeterminate oval masses in the right breast, 12 o'clock position, at an anterior depth. Mammogram also revealed a 2.6 x 2 cm irregular indeterminate left breast mass at the 10 o'clock position posterior depth. Bilateral breast ultrasound on that same date further revealed the 10/11 o'clock posterior depth left breast mass as highly suggestive of malignancy, measuring 2.9 x 2.2 x 1.8 cm. [place] also revealed an oval lymph nodes with focal cortical thickening in the left axilla as suspicious of malignancy. The previously seen bilateral oval cysts were noted to appear benign. (Imaging also noted the patient with D-density breasts).  ? ?Biopsy of the 2.9 cm left breast mass on 12/23/21 revealed a microscopic focus of grade 1-3 invasive ductal carcinoma measuring 0.2 cm in the greatest linear extent. Biopsy of the abnormal appearing left axillary lymph node showed no evidence of carcinoma. Prognostic  indicators significant for: estrogen receptor 10% positive with weak staining intensity; progesterone receptor 0% negative; Proliferation marker Ki67 at 25%; Her2 FISH was not performed due to the tissue sample being exhausted. ? ?Accordingly, the patient was referred to Dr, Donne Hazel on 12/26/21 to discuss further treatment options. Given absence of Her2 staging on her previous biopsy, Dr. Donne Hazel recommended an additional biopsy of the left breast mass, along with an MRI, referrals to medical oncology, radiation oncology, and genetics. In terms of treatment options, Dr. Donne Hazel discussed proceeding with breast conserving surgery and nodal biopsies.   ? ?Additional biopsy of the 11 o'clock left breast on 12/26/21 confirmed grade 3 invasive ductal carcinoma. Prognostic indicators significant for: estrogen receptor 40% positive with weak staining intensity; progesterone receptor negative; Proliferation marker Ki67 at 30%; Her2 status negative. ? ?[facility] genetic testing collected on 12/26/21 was negative for any pathogenic variants.  ? ?Bilateral breast MRI on 12/30/21 showed a 3.1 cm mass in the upper inner quadrant of the left breast as consistent with the patients biopsy-proven malignancy. An additional 1.2 cm mass in the upper inner left breast at a mid-depth was also appreciated, suggestive of an additional site of disease. The overall span of suspicious findings were noted as 6 ?cm in the AP dimension. MRI also showed numerous morphologically prominent abnormal level 1 and 2 axillary lymph nodes bilaterally. One of these was the previously biopsied node with benign results. No MRI evidence of malignancy was appreciated within the right breast.  ? ?The patient proceeded to undergo neoadjuvant chemotherapy consisting of TC, docetaxel, and cyclophosphamide for 4 cycles from 01/08/2022 through 03/12/22 under the care of Dr. Chryl Heck. The  patient has an excellent response to chemotherapy, with her tumor being barely  palpable prior to starting cycle 4 of treatment (on 03/12/22). Overall, the patient tolerated systemic treatment quite well other than mild fatigue and intermittent chills.  ? ?Following further discussion with Dr. Donne Hazel on 03/18/22, the patient has opted to proceed with breast conserving surgery with SLN biopsies. She scheduled for surgery on 04/13/22. Following lumpectomy, the patient is scheduled to undergo a mastopexy on 04/21/22 with Dr. Iran Planas.  ? ? ?PREVIOUS RADIATION THERAPY: No ? ?PAST MEDICAL HISTORY:  ?Past Medical History:  ?Diagnosis Date  ? Anemia   ? Anxiety   ? Depression   ? Glaucoma   ? ? ?PAST SURGICAL HISTORY: ?Past Surgical History:  ?Procedure Laterality Date  ? BREAST BIOPSY Left 01/19/2022  ? BREAST CYST EXCISION Left 01/28/2021  ? Procedure: LEFT BREAST MASS EXCISION;  Surgeon: Rolm Bookbinder, MD;  Location: [place];  Service: General;  Laterality: Left;  START TIME OF 3:00 PM FOR 60 MINUTES IN ROOM 8  ? HYSTEROSCOPY W/ ENDOMETRIAL ABLATION  08/14/2020  ? [facility]  ? ? ?FAMILY HISTORY:  ?Family History  ?Problem Relation Age of Onset  ? Anxiety disorder Sister   ? Drug abuse Mother   ? ? ?SOCIAL HISTORY:  ?Social History  ? ?Tobacco Use  ? Smoking status: Never  ? Smokeless tobacco: Never  ?Substance Use Topics  ? Alcohol use: Yes  ?  Alcohol/week: 1.0 standard drink  ?  Types: 1 Glasses of wine per week  ?  Comment: holidays  ? Drug use: Never  ? ? ?ALLERGIES:  ?Allergies  ?Allergen Reactions  ? Haemophilus Influenzae Vaccines Hives  ? Penicillins Hives  ? ? ?MEDICATIONS:  ?Current Outpatient Medications  ?Medication Sig Dispense Refill  ? dexamethasone (DECADRON) 4 MG tablet Take 2 tablets (8 mg total) by mouth 2 (two) times daily. Start the day before Taxotere. Then again the day after chemo for 3 days. 30 tablet 1  ? EPINEPHrine 0.3 mg/0.3 mL IJ SOAJ injection Inject 0.3 mg into the muscle as needed. For severe allergy    ? fluticasone (FLONASE) 50 MCG/ACT nasal  spray Place 2 sprays into the nose daily as needed.    ? hydrOXYzine (ATARAX) 25 MG tablet Take 1 tablet (25 mg total) by mouth daily as needed for anxiety. 90 tablet 0  ? lidocaine-prilocaine (EMLA) cream Apply to affected area once 30 g 3  ? LORazepam (ATIVAN) 0.5 MG tablet Take 1 tablet (0.5 mg total) by mouth every 6 (six) hours as needed (Nausea or vomiting). 30 tablet 0  ? medroxyPROGESTERone (PROVERA) 10 MG tablet Take 10 mg by mouth daily.    ? omalizumab (XOLAIR) 150 MG injection Inject 300 mg into the skin every 6 (six) weeks. Dues sept 2    ? ondansetron (ZOFRAN) 8 MG tablet Take 1 tablet (8 mg total) by mouth 2 (two) times daily as needed for refractory nausea / vomiting. Start on day 3 after chemo. 30 tablet 1  ? prazosin (MINIPRESS) 2 MG capsule Take 1 capsule (2 mg total) by mouth at bedtime. 90 capsule 0  ? prochlorperazine (COMPAZINE) 10 MG tablet Take 1 tablet (10 mg total) by mouth every 6 (six) hours as needed (Nausea or vomiting). 30 tablet 1  ? sertraline (ZOLOFT) 100 MG tablet Take 2 tablets (200 mg total) by mouth daily. 180 tablet 1  ? ?No current facility-administered medications for this encounter.  ? ? ?REVIEW OF SYSTEMS:  A 10+ POINT REVIEW OF SYSTEMS WAS OBTAINED including neurology, dermatology, psychiatry, cardiac, respiratory, lymph, extremities, GI, GU, musculoskeletal, constitutional, reproductive, HEENT.  She initially reported some pain in the site of presentation within the left breast but none recently.  She denied any nipple discharge or bleeding prior to diagnosis.  She denies any problems with swelling in her left arm or hand. ?  ?PHYSICAL EXAM:  height is 5' 7"  (1.702 m) and weight is 215 lb 2 oz (97.6 kg). Her temporal temperature is 96.8 ?F (36 ?C) (abnormal). Her blood pressure is 121/71 and her pulse is 78. Her respiration is 18 and oxygen saturation is 99%.   ?General: Alert and oriented, in no acute distress ?HEENT: Head is normocephalic. Extraocular movements are  intact.  ?Neck: Neck is supple, no palpable cervical or supraclavicular lymphadenopathy. ?Heart: Regular in rate and rhythm with no murmurs, rubs, or gallops. ?Chest: Clear to auscultation bilaterally, with n

## 2022-04-01 ENCOUNTER — Other Ambulatory Visit: Payer: Self-pay

## 2022-04-01 ENCOUNTER — Ambulatory Visit
Admission: RE | Admit: 2022-04-01 | Discharge: 2022-04-01 | Disposition: A | Discharge status: Home / Self Care | Payer: 59 | Source: Ambulatory Visit | Attending: Radiation Oncology | Admitting: Radiation Oncology

## 2022-04-01 VITALS — BP 121/71 | HR 78 | Temp 96.8°F | Resp 18 | Ht 67.0 in | Wt 215.1 lb

## 2022-04-01 DIAGNOSIS — D649 Anemia, unspecified: Secondary | ICD-10-CM | POA: Insufficient documentation

## 2022-04-01 DIAGNOSIS — Z79899 Other long term (current) drug therapy: Secondary | ICD-10-CM | POA: Insufficient documentation

## 2022-04-01 DIAGNOSIS — Z793 Long term (current) use of hormonal contraceptives: Secondary | ICD-10-CM | POA: Diagnosis not present

## 2022-04-01 DIAGNOSIS — Z9221 Personal history of antineoplastic chemotherapy: Secondary | ICD-10-CM | POA: Diagnosis not present

## 2022-04-01 DIAGNOSIS — Z170 Estrogen receptor positive status [ER+]: Secondary | ICD-10-CM | POA: Diagnosis not present

## 2022-04-01 DIAGNOSIS — C50212 Malignant neoplasm of upper-inner quadrant of left female breast: Secondary | ICD-10-CM

## 2022-04-01 DIAGNOSIS — Z7952 Long term (current) use of systemic steroids: Secondary | ICD-10-CM | POA: Insufficient documentation

## 2022-04-02 ENCOUNTER — Encounter (HOSPITAL_BASED_OUTPATIENT_CLINIC_OR_DEPARTMENT_OTHER): Payer: Self-pay | Admitting: General Surgery

## 2022-04-02 ENCOUNTER — Ambulatory Visit (HOSPITAL_COMMUNITY): Payer: 59 | Admitting: Psychiatry

## 2022-04-03 ENCOUNTER — Encounter: Payer: Self-pay | Admitting: General Surgery

## 2022-04-10 ENCOUNTER — Other Ambulatory Visit: Payer: Self-pay | Admitting: General Surgery

## 2022-04-10 DIAGNOSIS — C50212 Malignant neoplasm of upper-inner quadrant of left female breast: Secondary | ICD-10-CM

## 2022-04-13 ENCOUNTER — Encounter (HOSPITAL_BASED_OUTPATIENT_CLINIC_OR_DEPARTMENT_OTHER)
Admission: RE | Disposition: A | Discharge status: Home / Self Care | Payer: Self-pay | Source: Home / Self Care | Attending: General Surgery

## 2022-04-13 ENCOUNTER — Ambulatory Visit (HOSPITAL_BASED_OUTPATIENT_CLINIC_OR_DEPARTMENT_OTHER)
Admission: RE | Admit: 2022-04-13 | Discharge: 2022-04-13 | Disposition: A | Discharge status: Home / Self Care | Payer: 59 | Attending: General Surgery | Admitting: General Surgery

## 2022-04-13 ENCOUNTER — Ambulatory Visit (HOSPITAL_BASED_OUTPATIENT_CLINIC_OR_DEPARTMENT_OTHER): Payer: 59 | Admitting: Anesthesiology

## 2022-04-13 ENCOUNTER — Other Ambulatory Visit: Payer: Self-pay

## 2022-04-13 ENCOUNTER — Encounter (HOSPITAL_BASED_OUTPATIENT_CLINIC_OR_DEPARTMENT_OTHER): Payer: Self-pay | Admitting: General Surgery

## 2022-04-13 DIAGNOSIS — Z79899 Other long term (current) drug therapy: Secondary | ICD-10-CM | POA: Insufficient documentation

## 2022-04-13 DIAGNOSIS — C50212 Malignant neoplasm of upper-inner quadrant of left female breast: Secondary | ICD-10-CM | POA: Insufficient documentation

## 2022-04-13 DIAGNOSIS — Z170 Estrogen receptor positive status [ER+]: Secondary | ICD-10-CM | POA: Insufficient documentation

## 2022-04-13 DIAGNOSIS — Z01818 Encounter for other preprocedural examination: Secondary | ICD-10-CM

## 2022-04-13 DIAGNOSIS — C50912 Malignant neoplasm of unspecified site of left female breast: Secondary | ICD-10-CM

## 2022-04-13 HISTORY — PX: BREAST LUMPECTOMY WITH RADIOACTIVE SEED AND SENTINEL LYMPH NODE BIOPSY: SHX6550

## 2022-04-13 LAB — POCT PREGNANCY, URINE: Preg Test, Ur: NEGATIVE

## 2022-04-13 SURGERY — BREAST LUMPECTOMY WITH RADIOACTIVE SEED AND SENTINEL LYMPH NODE BIOPSY
Anesthesia: General | Site: Breast | Laterality: Left

## 2022-04-13 MED ORDER — MIDAZOLAM HCL 2 MG/2ML IJ SOLN
INTRAMUSCULAR | Status: AC
  Filled 2022-04-13: qty 2

## 2022-04-13 MED ORDER — LIDOCAINE 2% (20 MG/ML) 5 ML SYRINGE
INTRAMUSCULAR | Status: AC
  Filled 2022-04-13: qty 5

## 2022-04-13 MED ORDER — ACETAMINOPHEN 500 MG PO TABS
ORAL_TABLET | ORAL | Status: AC
  Filled 2022-04-13: qty 2

## 2022-04-13 MED ORDER — ONDANSETRON HCL 4 MG/2ML IJ SOLN
INTRAMUSCULAR | Status: AC
Start: 2022-04-13 — End: ?
  Filled 2022-04-13: qty 2

## 2022-04-13 MED ORDER — BUPIVACAINE HCL (PF) 0.25 % IJ SOLN
INTRAMUSCULAR | Status: AC
  Filled 2022-04-13: qty 90

## 2022-04-13 MED ORDER — ENSURE PRE-SURGERY PO LIQD: 296.0000 mL | Freq: Once | ORAL | Status: DC

## 2022-04-13 MED ORDER — EPHEDRINE SULFATE (PRESSORS) 50 MG/ML IJ SOLN
INTRAMUSCULAR | Status: DC | PRN
Start: 2022-04-13 — End: 2022-04-13
  Administered 2022-04-13: 15 mg via INTRAVENOUS
  Administered 2022-04-13: 10 mg via INTRAVENOUS

## 2022-04-13 MED ORDER — FENTANYL CITRATE (PF) 100 MCG/2ML IJ SOLN
INTRAMUSCULAR | Status: AC
  Filled 2022-04-13: qty 2

## 2022-04-13 MED ORDER — OXYCODONE HCL 5 MG PO TABS: 5.0000 mg | ORAL_TABLET | Freq: Four times a day (QID) | ORAL | 0 refills | Status: DC | PRN

## 2022-04-13 MED ORDER — KETOROLAC TROMETHAMINE 30 MG/ML IJ SOLN
INTRAMUSCULAR | Status: AC
  Filled 2022-04-13: qty 1

## 2022-04-13 MED ORDER — LIDOCAINE HCL (CARDIAC) PF 100 MG/5ML IV SOSY
PREFILLED_SYRINGE | INTRAVENOUS | Status: DC | PRN
  Administered 2022-04-13: 20 mg via INTRAVENOUS

## 2022-04-13 MED ORDER — KETOROLAC TROMETHAMINE 15 MG/ML IJ SOLN
INTRAMUSCULAR | Status: AC
  Filled 2022-04-13: qty 1

## 2022-04-13 MED ORDER — DEXAMETHASONE SODIUM PHOSPHATE 4 MG/ML IJ SOLN
INTRAMUSCULAR | Status: DC | PRN
  Administered 2022-04-13: 5 mg via INTRAVENOUS

## 2022-04-13 MED ORDER — CLONIDINE HCL (ANALGESIA) 100 MCG/ML EP SOLN
EPIDURAL | Status: DC | PRN
  Administered 2022-04-13: 50 ug

## 2022-04-13 MED ORDER — PROPOFOL 10 MG/ML IV BOLUS
INTRAVENOUS | Status: DC | PRN
  Administered 2022-04-13: 200 mg via INTRAVENOUS

## 2022-04-13 MED ORDER — BUPIVACAINE HCL (PF) 0.25 % IJ SOLN
INTRAMUSCULAR | Status: DC | PRN
  Administered 2022-04-13: 3 mL

## 2022-04-13 MED ORDER — CHLORHEXIDINE GLUCONATE CLOTH 2 % EX PADS: 6.0000 | MEDICATED_PAD | Freq: Once | CUTANEOUS | Status: DC

## 2022-04-13 MED ORDER — KETOROLAC TROMETHAMINE 15 MG/ML IJ SOLN
15.0000 mg | INTRAMUSCULAR | Status: AC
  Administered 2022-04-13: 15 mg via INTRAVENOUS

## 2022-04-13 MED ORDER — GLYCOPYRROLATE 0.2 MG/ML IJ SOLN
INTRAMUSCULAR | Status: DC | PRN
  Administered 2022-04-13 (×2): .1 mg via INTRAVENOUS

## 2022-04-13 MED ORDER — FENTANYL CITRATE (PF) 100 MCG/2ML IJ SOLN
100.0000 ug | Freq: Once | INTRAMUSCULAR | Status: AC
  Administered 2022-04-13: 100 ug via INTRAVENOUS

## 2022-04-13 MED ORDER — MIDAZOLAM HCL 2 MG/2ML IJ SOLN
2.0000 mg | Freq: Once | INTRAMUSCULAR | Status: AC
  Administered 2022-04-13: 2 mg via INTRAVENOUS

## 2022-04-13 MED ORDER — DEXAMETHASONE SODIUM PHOSPHATE 10 MG/ML IJ SOLN
INTRAMUSCULAR | Status: AC
  Filled 2022-04-13: qty 1

## 2022-04-13 MED ORDER — MIDAZOLAM HCL 5 MG/5ML IJ SOLN: INTRAMUSCULAR | Status: DC | PRN

## 2022-04-13 MED ORDER — AMISULPRIDE (ANTIEMETIC) 5 MG/2ML IV SOLN: 10.0000 mg | Freq: Once | INTRAVENOUS | Status: DC | PRN

## 2022-04-13 MED ORDER — EPHEDRINE 5 MG/ML INJ
INTRAVENOUS | Status: AC
  Filled 2022-04-13: qty 5

## 2022-04-13 MED ORDER — ACETAMINOPHEN 500 MG PO TABS
1000.0000 mg | ORAL_TABLET | ORAL | Status: AC
  Administered 2022-04-13: 1000 mg via ORAL

## 2022-04-13 MED ORDER — CIPROFLOXACIN IN D5W 400 MG/200ML IV SOLN
400.0000 mg | INTRAVENOUS | Status: AC
  Administered 2022-04-13: 400 mg via INTRAVENOUS

## 2022-04-13 MED ORDER — FENTANYL CITRATE (PF) 100 MCG/2ML IJ SOLN
INTRAMUSCULAR | Status: DC | PRN
  Administered 2022-04-13: 50 ug via INTRAVENOUS

## 2022-04-13 MED ORDER — PROPOFOL 10 MG/ML IV BOLUS
INTRAVENOUS | Status: AC
  Filled 2022-04-13: qty 20

## 2022-04-13 MED ORDER — CIPROFLOXACIN IN D5W 400 MG/200ML IV SOLN
INTRAVENOUS | Status: AC
  Filled 2022-04-13: qty 200

## 2022-04-13 MED ORDER — MAGTRACE LYMPHATIC TRACER
INTRAMUSCULAR | Status: DC | PRN
Start: 2022-04-13 — End: 2022-04-13
  Administered 2022-04-13: 2 mL via INTRAMUSCULAR

## 2022-04-13 MED ORDER — LACTATED RINGERS IV SOLN: INTRAVENOUS | Status: DC

## 2022-04-13 MED ORDER — BUPIVACAINE-EPINEPHRINE (PF) 0.5% -1:200000 IJ SOLN
INTRAMUSCULAR | Status: DC | PRN
  Administered 2022-04-13: 30 mL

## 2022-04-13 MED ORDER — DEXAMETHASONE SODIUM PHOSPHATE 10 MG/ML IJ SOLN
INTRAMUSCULAR | Status: AC
Start: 2022-04-13 — End: ?
  Filled 2022-04-13: qty 1

## 2022-04-13 MED ORDER — FENTANYL CITRATE (PF) 100 MCG/2ML IJ SOLN: 25.0000 ug | INTRAMUSCULAR | Status: DC | PRN

## 2022-04-13 MED ORDER — GLYCOPYRROLATE PF 0.2 MG/ML IJ SOSY
PREFILLED_SYRINGE | INTRAMUSCULAR | Status: AC
  Filled 2022-04-13: qty 1

## 2022-04-13 MED ORDER — ONDANSETRON HCL 4 MG/2ML IJ SOLN
INTRAMUSCULAR | Status: DC | PRN
Start: 2022-04-13 — End: 2022-04-13
  Administered 2022-04-13: 4 mg via INTRAVENOUS

## 2022-04-13 SURGICAL SUPPLY — 52 items
ADH SKN CLS APL DERMABOND .7 (GAUZE/BANDAGES/DRESSINGS) ×1
APL PRP STRL LF DISP 70% ISPRP (MISCELLANEOUS) ×1
APPLIER CLIP 9.375 MED OPEN (MISCELLANEOUS) ×2
APR CLP MED 9.3 20 MLT OPN (MISCELLANEOUS) ×1
BINDER BREAST XLRG (GAUZE/BANDAGES/DRESSINGS) IMPLANT
BINDER BREAST XXLRG (GAUZE/BANDAGES/DRESSINGS) ×1 IMPLANT
BLADE SURG 15 STRL LF DISP TIS (BLADE) ×1 IMPLANT
BLADE SURG 15 STRL SS (BLADE) ×2
CHLORAPREP W/TINT 26 (MISCELLANEOUS) ×2 IMPLANT
CLIP APPLIE 9.375 MED OPEN (MISCELLANEOUS) IMPLANT
COVER BACK TABLE 60X90IN (DRAPES) ×2 IMPLANT
COVER MAYO STAND STRL (DRAPES) ×2 IMPLANT
COVER PROBE W GEL 5X96 (DRAPES) ×3 IMPLANT
DERMABOND ADVANCED (GAUZE/BANDAGES/DRESSINGS) ×1
DERMABOND ADVANCED .7 DNX12 (GAUZE/BANDAGES/DRESSINGS) ×1 IMPLANT
DRAPE LAPAROSCOPIC ABDOMINAL (DRAPES) ×2 IMPLANT
DRAPE UTILITY XL STRL (DRAPES) ×2 IMPLANT
ELECT COATED BLADE 2.86 ST (ELECTRODE) ×2 IMPLANT
ELECT REM PT RETURN 9FT ADLT (ELECTROSURGICAL) ×2
ELECTRODE REM PT RTRN 9FT ADLT (ELECTROSURGICAL) ×1 IMPLANT
GLOVE BIO SURGEON STRL SZ7 (GLOVE) ×4 IMPLANT
GLOVE BIOGEL PI IND STRL 6.5 (GLOVE) IMPLANT
GLOVE BIOGEL PI IND STRL 7.0 (GLOVE) IMPLANT
GLOVE BIOGEL PI IND STRL 7.5 (GLOVE) ×1 IMPLANT
GLOVE BIOGEL PI INDICATOR 6.5 (GLOVE) ×1
GLOVE BIOGEL PI INDICATOR 7.0 (GLOVE) ×1
GLOVE BIOGEL PI INDICATOR 7.5 (GLOVE) ×1
GLOVE SURG SS PI 7.0 STRL IVOR (GLOVE) ×1 IMPLANT
GOWN STRL REUS W/ TWL LRG LVL3 (GOWN DISPOSABLE) ×2 IMPLANT
GOWN STRL REUS W/TWL LRG LVL3 (GOWN DISPOSABLE) ×4
KIT MARKER MARGIN INK (KITS) ×2 IMPLANT
NDL HYPO 25X1 1.5 SAFETY (NEEDLE) ×1 IMPLANT
NDL SAFETY ECLIPSE 18X1.5 (NEEDLE) IMPLANT
NEEDLE HYPO 18GX1.5 SHARP (NEEDLE)
NEEDLE HYPO 25X1 1.5 SAFETY (NEEDLE) ×2 IMPLANT
NS IRRIG 1000ML POUR BTL (IV SOLUTION) ×1 IMPLANT
PACK BASIN DAY SURGERY FS (CUSTOM PROCEDURE TRAY) ×2 IMPLANT
PENCIL SMOKE EVACUATOR (MISCELLANEOUS) ×2 IMPLANT
SLEEVE SCD COMPRESS KNEE MED (STOCKING) ×2 IMPLANT
SPONGE T-LAP 4X18 ~~LOC~~+RFID (SPONGE) ×3 IMPLANT
STRIP CLOSURE SKIN 1/2X4 (GAUZE/BANDAGES/DRESSINGS) ×2 IMPLANT
SUT MNCRL AB 4-0 PS2 18 (SUTURE) ×3 IMPLANT
SUT VIC AB 2-0 SH 27 (SUTURE) ×4
SUT VIC AB 2-0 SH 27XBRD (SUTURE) ×1 IMPLANT
SUT VIC AB 3-0 SH 27 (SUTURE) ×2
SUT VIC AB 3-0 SH 27X BRD (SUTURE) ×1 IMPLANT
SYR CONTROL 10ML LL (SYRINGE) ×2 IMPLANT
TOWEL GREEN STERILE FF (TOWEL DISPOSABLE) ×2 IMPLANT
TRACER MAGTRACE VIAL (MISCELLANEOUS) ×1 IMPLANT
TRAY FAXITRON CT DISP (TRAY / TRAY PROCEDURE) ×2 IMPLANT
TUBE CONNECTING 20X1/4 (TUBING) ×1 IMPLANT
YANKAUER SUCT BULB TIP NO VENT (SUCTIONS) ×1 IMPLANT

## 2022-04-13 NOTE — Addendum Note (Signed)
Addendum  created 04/13/22 1040 by Bufford Spikes, CRNA   Flowsheet accepted

## 2022-04-13 NOTE — Anesthesia Procedure Notes (Signed)
Anesthesia Regional Block: Pectoralis block   Pre-Anesthetic Checklist: , timeout performed,  Correct Patient, Correct Site, Correct Laterality,  Correct Procedure, Correct Position, site marked,  Risks and benefits discussed,  Surgical consent,  Pre-op evaluation,  At surgeon's request and post-op pain management  Laterality: Left  Prep: chloraprep       Needles:  Injection technique: Single-shot  Needle Type: Echogenic Needle     Needle Length: 9cm  Needle Gauge: 21     Additional Needles:   Procedures:,,,, ultrasound used (permanent image in chart),,    Narrative:  Start time: 04/13/2022 6:52 AM End time: 04/13/2022 6:58 AM Injection made incrementally with aspirations every 5 mL.  Performed by: Personally  Anesthesiologist: Suzette Battiest, MD

## 2022-04-13 NOTE — Discharge Instructions (Addendum)
[place] Office Phone Number [phone]  POST OP INSTRUCTIONS Take 400 mg of ibuprofen every 8 hours or 650 mg tylenol every 6 hours for next 72 hours then as needed. Use ice several times daily also.  A prescription for pain medication may be given to you upon discharge.  Take your pain medication as prescribed, if needed.  If narcotic pain medicine is not needed, then you may take acetaminophen (Tylenol), naprosyn (Alleve) or ibuprofen (Advil) as needed. Take your usually prescribed medications unless otherwise directed If you need a refill on your pain medication, please contact your pharmacy.  They will contact our office to request authorization.  Prescriptions will not be filled after 5pm or on week-ends. You should eat very light the first 24 hours after surgery, such as soup, crackers, pudding, etc.  Resume your normal diet the day after surgery. Most patients will experience some swelling and bruising in the breast.  Ice packs and a good support bra will help.  Wear the breast binder provided or a sports bra for 72 hours day and night.  After that wear a sports bra during the day until you return to the office. Swelling and bruising can take several days to resolve.  It is common to experience some constipation if taking pain medication after surgery.  Increasing fluid intake and taking a stool softener will usually help or prevent this problem from occurring.  A mild laxative (Milk of Magnesia or Miralax) should be taken according to package directions if there are no bowel movements after 48 hours. I used skin glue on the incision, you may shower in 24 hours.  The glue will flake off over the next 2-3 weeks.  Any sutures or staples will be removed at the office during your follow-up visit. ACTIVITIES:  You may resume regular daily activities (gradually increasing) beginning the next day.  Wearing a good support bra or sports bra minimizes pain and swelling.  You may have  sexual intercourse when it is comfortable. You may drive when you no longer are taking prescription pain medication, you can comfortably wear a seatbelt, and you can safely maneuver your car and apply brakes. RETURN TO WORK:  ______________________________________________________________________________________ Dennis Bast should see your doctor in the office for a follow-up appointment approximately two weeks after your surgery.  Your doctor's nurse will typically make your follow-up appointment when she calls you with your pathology report.  Expect your pathology report 3-4 business days after your surgery.  You may call to check if you do not hear from [place] after three days. OTHER INSTRUCTIONS: _______________________________________________________________________________________________ _____________________________________________________________________________________________________________________________________ _____________________________________________________________________________________________________________________________________ _____________________________________________________________________________________________________________________________________  WHEN TO CALL DR WAKEFIELD: Fever over 101.0 Nausea and/or vomiting. Extreme swelling or bruising. Continued bleeding from incision. Increased pain, redness, or drainage from the incision.  The clinic staff is available to answer your questions during regular business hours.  Please don't hesitate to call and ask to speak to one of the nurses for clinical concerns.  If you have a medical emergency, go to the nearest emergency room or call 911.  A surgeon from [facility] is always on call at the hospital.  For further questions, please visit [email or web address] May take Tylenol after 12:45 pm, if needed. May take NSAIDS (ibuprofen, motrin) after 12:45pm, I needed    Post Anesthesia Home Care  Instructions  Activity: Get plenty of rest for the remainder of the day. A responsible individual must stay with you for 24 hours following the procedure.  For the next  24 hours, DO NOT: -Drive a car -Paediatric nurse -Drink alcoholic beverages -Take any medication unless instructed by your physician -Make any legal decisions or sign important papers.  Meals: Start with liquid foods such as gelatin or soup. Progress to regular foods as tolerated. Avoid greasy, spicy, heavy foods. If nausea and/or vomiting occur, drink only clear liquids until the nausea and/or vomiting subsides. Call your physician if vomiting continues.  Special Instructions/Symptoms: Your throat may feel dry or sore from the anesthesia or the breathing tube placed in your throat during surgery. If this causes discomfort, gargle with warm salt water. The discomfort should disappear within 24 hours.  If you had a scopolamine patch placed behind your ear for the management of post- operative nausea and/or vomiting:  1. The medication in the patch is effective for 72 hours, after which it should be removed.  Wrap patch in a tissue and discard in the trash. Wash hands thoroughly with soap and water. 2. You may remove the patch earlier than 72 hours if you experience unpleasant side effects which may include dry mouth, dizziness or visual disturbances. 3. Avoid touching the patch. Wash your hands with soap and water after contact with the patch.    Regional Anesthesia Blocks  1. Numbness or the inability to move the "blocked" extremity may last from 3-48 hours after placement. The length of time depends on the medication injected and your individual response to the medication. If the numbness is not going away after 48 hours, call your surgeon.  2. The extremity that is blocked will need to be protected until the numbness is gone and the  Strength has returned. Because you cannot feel it, you will need to take extra care to  avoid injury. Because it may be weak, you may have difficulty moving it or using it. You may not know what position it is in without looking at it while the block is in effect.  3. For blocks in the legs and feet, returning to weight bearing and walking needs to be done carefully. You will need to wait until the numbness is entirely gone and the strength has returned. You should be able to move your leg and foot normally before you try and bear weight or walk. You will need someone to be with you when you first try to ensure you do not fall and possibly risk injury.  4. Bruising and tenderness at the needle site are common side effects and will resolve in a few days.  5. Persistent numbness or new problems with movement should be communicated to the surgeon or the [place] [phone] [place] [phone]).

## 2022-04-13 NOTE — Progress Notes (Signed)
Assisted Dr. Donne Hazel with Mag Trace injection on the left breast.  VSS, pt tolerated well.

## 2022-04-13 NOTE — Transfer of Care (Signed)
Immediate Anesthesia Transfer of Care Note  Patient: Maria Diaz  Procedure(s) Performed: LEFT BREAST BRACKETED LUMPECTOMY WITH RADIOACTIVE SEED AND AXILLARY SENTINEL LYMPH NODE BIOPSY (Left: Breast)  Patient Location: PACU  Anesthesia Type:General  Level of Consciousness: awake, alert  and oriented  Airway & Oxygen Therapy: Patient Spontanous Breathing and Patient connected to nasal cannula oxygen  Post-op Assessment: Report given to RN and Post -op Vital signs reviewed and stable  Post vital signs: Reviewed and stable  Last Vitals:  Vitals Value Taken Time  BP    Temp    Pulse 96 04/13/22 0848  Resp 17 04/13/22 0848  SpO2 99 % 04/13/22 0848  Vitals shown include unvalidated device data.  Last Pain:  Vitals:   04/13/22 0638  TempSrc: Oral  PainSc: 0-No pain      Patients Stated Pain Goal: 4 (48/01/65 5374)  Complications: No notable events documented.

## 2022-04-13 NOTE — Anesthesia Postprocedure Evaluation (Signed)
Anesthesia Post Note  Patient: Maria Diaz  Procedure(s) Performed: LEFT BREAST BRACKETED LUMPECTOMY WITH RADIOACTIVE SEED AND AXILLARY SENTINEL LYMPH NODE BIOPSY (Left: Breast)     Patient location during evaluation: PACU Anesthesia Type: General Level of consciousness: awake and alert Pain management: pain level controlled Vital Signs Assessment: post-procedure vital signs reviewed and stable Respiratory status: spontaneous breathing, nonlabored ventilation, respiratory function stable and patient connected to nasal cannula oxygen Cardiovascular status: blood pressure returned to baseline and stable Postop Assessment: no apparent nausea or vomiting Anesthetic complications: no   No notable events documented.  Last Vitals:  Vitals:   04/13/22 0915 04/13/22 0929  BP: 118/74 114/71  Pulse: 89 88  Resp: 15 18  Temp:    SpO2: 93% 94%    Last Pain:  Vitals:   04/13/22 0915  TempSrc:   PainSc: 0-No pain                 Tiajuana Amass

## 2022-04-13 NOTE — H&P (Signed)
51 y.o. who I took to or in 2022 for a mass that was recurrent.  This was all a benign cyst.  Since then she has done well but noticing a left breast upper inner quadrant mass.  This is tender.  She has had no discharge.  This is increased in size.  She told me she had a mammogram in [place] that was negative.  I sent for [place] at [facility] and she has one questionable node as well is a 2.9 cm left upper inner quadrant breast mass.  The node was biopsied and this is benign and concordant.  The breast was biopsied and this shows a microscopic focus of invasive ductal carcinoma.  This is 10% ER positive, PR negative, the Ki-67 is 25%. rebiopsy proved her to be her 2 negative. She has an mri that shows d density breasts.  IMPRESSION: 1. 3.1 cm mass in the upper inner quadrant of the left breast consistent with the patient's biopsy-proven malignancy. 2. Additional 1.2 cm mass in the upper inner left breast at mid depth (series 8, image 79), suggestive of an additional site of disease. The overall span of suspicious findings is greater than 6 cm in the AP dimension. 3. Numerous prominent in morphologically abnormal level 1 and 2 axillary lymph nodes bilaterally. One of these was previously biopsied with benign results. Given the bilaterality, findings may represent a lymphoproliferative process. 4. No MRI evidence of malignancy within the right breast. Biopsy of the 1.2 cm is a grade III IDC She has now completed chemotherapy and cannot really feel mass. She just finished last week. She did well overall. Her insurance company would not approve follow up mri. She had a mm and [place] today. This shows the previous 2.5x2.3 cm mass in left breast at 11 oclock now is 1.3x1.1 cm mass. The prior 1.2 cm mass is now gone. She is here to discuss surgery.   Review of Systems: A complete review of systems was obtained from the patient. I have reviewed this information and discussed as appropriate with the patient. See HPI as  well for other ROS.  Review of Systems  All other systems reviewed and are negative.   Medical History: Past Medical History:  Diagnosis Date   Anxiety   Patient Active Problem List  Diagnosis   Malignant neoplasm of upper-inner quadrant of left breast in female, estrogen receptor positive (CMS-HCC)   Past Surgical History:  Procedure Laterality Date   BREAST EXCISIONAL BIOPSY Left    Allergies  Allergen Reactions   Penicillins Hives   Haemophilus Influenzae Vaccines Hives   Current Outpatient Medications on File Prior to Visit  Medication Sig Dispense Refill   EPINEPHrine (EPIPEN) 0.3 mg/0.3 mL auto-injector Inject into the muscle   ferrous sulfate 325 (65 FE) MG EC tablet Take by mouth   omalizumab (XOLAIR) 150 mg vial Inject subcutaneously   sertraline (ZOLOFT) 100 MG tablet Take by mouth   No current facility-administered medications on file prior to visit.   Family History  Problem Relation Age of Onset   High blood pressure (Hypertension) Mother    Social History   Tobacco Use  Smoking Status Never  Smokeless Tobacco Never    Social History   Socioeconomic History   Marital status: Divorced  Tobacco Use   Smoking status: Never   Smokeless tobacco: Never  Substance and Sexual Activity   Alcohol use: Never   Drug use: Never   Objective:   Physical Exam Vitals reviewed.  Constitutional:  Appearance: Normal appearance.  Chest:  Breasts: Right: No inverted nipple, mass or nipple discharge.  Left: No inverted nipple, mass or nipple discharge.  Lymphadenopathy:  Upper Body:  Right upper body: No supraclavicular or axillary adenopathy.  Left upper body: No supraclavicular or axillary adenopathy.  Neurological:  Mental Status: She is alert.   Assessment and Plan:   Malignant neoplasm of upper-inner quadrant of left breast in female, estrogen receptor positive (CMS-HCC)  Left breast bracketed lumpectomy, left axillary sentinel lymph node  biopsy, followed by oncoplastic reduction  We discussed a sentinel lymph node biopsy as she does not appear to having lymph node involvement still. We discussed the performance of that with injection of radioactive tracer. We discussed that there is a chance of having a positive node with a sentinel lymph node biopsy and we will await the permanent pathology to make any other first further decisions in terms of her treatment. We discussed up to a 5% risk lifetime of chronic shoulder pain as well as lymphedema associated with a sentinel lymph node biopsy. We discussed the options for treatment of the breast cancer which included bracketed lumpectomy versus a mastectomy. We discussed the performance of the lumpectomy with radioactive seed placement. We discussed a 5-10% chance of a positive margin requiring reexcision in the operating room. We also discussed that she will need radiation therapy if she undergoes lumpectomy. We discussed mastectomy and the postoperative care for that as well. Mastectomy can be followed by reconstruction. The decision for lumpectomy vs mastectomy has no impact on decision for chemotherapy. Most mastectomy patients will not need radiation therapy. We discussed that there is no difference in her survival whether she undergoes lumpectomy with radiation therapy or antiestrogen therapy versus a mastectomy. There is also no real difference between her recurrence in the breast. We discussed the risks of operation including bleeding, infection, possible reoperation. She understands her further therapy will be based on what her stages at the time of her operation.

## 2022-04-13 NOTE — Anesthesia Procedure Notes (Signed)
Procedure Name: LMA Insertion Date/Time: 04/13/2022 7:28 AM Performed by: Bufford Spikes, CRNA Pre-anesthesia Checklist: Patient identified, Emergency Drugs available, Suction available and Patient being monitored Patient Re-evaluated:Patient Re-evaluated prior to induction Oxygen Delivery Method: Circle system utilized Preoxygenation: Pre-oxygenation with 100% oxygen Induction Type: IV induction Ventilation: Mask ventilation without difficulty LMA: LMA inserted LMA Size: 4.0 Number of attempts: 1 Airway Equipment and Method: Bite block Placement Confirmation: positive ETCO2 Tube secured with: Tape Dental Injury: Teeth and Oropharynx as per pre-operative assessment

## 2022-04-13 NOTE — Op Note (Signed)
Preoperative diagnosis: Left breast cancer status post primary chemotherapy Postoperative diagnosis: Same as above Procedure: 1.  Left breast radioactive seed bracketed lumpectomy 2.  Left deep axillary sentinel lymph node biopsy 3.  Injection of mag tray for sentinel lymph node identification Surgeon: Dr. Serita Grammes Anesthesia: General with a pectoral block Estimated blood loss: Minimal Specimens: 1.  Left breast tissue containing 2 seeds and 2 clips 2.  Left deep axillary sentinel lymph nodes with highest count of 6767 Complications: None Drains: None Sponge needle count was correct at completion Disposition to recovery stable condition  Indications: This is a 51 year old female who was noted to have a 2.9 cm left upper inner quadrant breast mass.  She had a node that was biopsied and was benign and concordant.  The breast was shown to be a functionally triple negative tumor.  She had an MRI that showed a 3.1 cm mass as well as an additional 1.2 cm mass.  She has numerous prominent lymph nodes present.  She underwent primary chemotherapy.  She was not able to get a completion MRI but did follow-up with a mammogram which showed decrease in size of these lesions.  We discussed her options and elected proceed with a left breast seed bracketed lumpectomy, sentinel node biopsy, followed by an oncoplastic reduction if her margins are all clear.  Procedure: After informed consent was obtained the patient first had both of her seeds placed.  I had these mammograms available and I discussed this with radiology.  She then underwent a pectoral block.  She was given antibiotics.  SCDs were placed.  I prepped the area lateral to the areola.  I then injected 2 cc of mag trace in the subareolar position.  She was then taken to the operating room.  She was placed under general anesthesia without complication.  She was prepped and draped in the standard sterile surgical fashion.  Surgical timeout was then  performed.  She had been marked by plastic surgery previously as well.  I made an incision in the area where the skin was going to be removed.  I did this in a curvilinear fashion.  I followed the radioactive seed in the posterior position first.  I remove this with an attempt to get a clear margin around that.  I took this down to the pectoralis muscle including the fascia.  I then identified the anterior seed and remove this and all of the tissue in between.  I did a mammogram which confirmed removal of 2 seeds and 2 clips.  I did a 3D imaging and I thought that it look like my margins were mostly clear.  I then obtained hemostasis.  I closed the cavity loosely with 2-0 Vicryl suture.  The skin was closed with 3-0 Vicryl for Monocryl.  Glue and Steri-Strips were applied.  I then made an incision below the axillary hairline.  I carried this through the axillary fascia.  There were at least 2 nodes with activity as well as brown staining.  I remove these.  There were no abnormal lymph nodes otherwise.  I then obtained hemostasis.  I closed the axilla with 2-0 Vicryl, 3-0 Vicryl, 4 Monocryl.  Glue and Steri-Strips were applied.  She tolerated this well was extubated transferred to recovery room binder was placed.

## 2022-04-13 NOTE — Anesthesia Preprocedure Evaluation (Signed)
Anesthesia Evaluation  Patient identified by MRN, date of birth, ID band Patient awake    Reviewed: Allergy & Precautions, NPO status , Patient's Chart, lab work & pertinent test results  Airway Mallampati: II  TM Distance: >3 FB Neck ROM: Full    Dental  (+) Dental Advisory Given   Pulmonary neg pulmonary ROS,    breath sounds clear to auscultation       Cardiovascular negative cardio ROS   Rhythm:Regular Rate:Normal     Neuro/Psych negative neurological ROS     GI/Hepatic negative GI ROS, Neg liver ROS,   Endo/Other  negative endocrine ROS  Renal/GU negative Renal ROS     Musculoskeletal   Abdominal   Peds  Hematology negative hematology ROS (+)   Anesthesia Other Findings   Reproductive/Obstetrics                             Anesthesia Physical Anesthesia Plan  ASA: 2  Anesthesia Plan: General   Post-op Pain Management: Regional block*, Tylenol PO (pre-op)* and Toradol IV (intra-op)*   Induction: Intravenous  PONV Risk Score and Plan: 3 and Ondansetron, Dexamethasone, Midazolam and Treatment may vary due to age or medical condition  Airway Management Planned: LMA  Additional Equipment: None  Intra-op Plan:   Post-operative Plan: Extubation in OR  Informed Consent: I have reviewed the patients History and Physical, chart, labs and discussed the procedure including the risks, benefits and alternatives for the proposed anesthesia with the patient or authorized representative who has indicated his/her understanding and acceptance.     Dental advisory given  Plan Discussed with: CRNA  Anesthesia Plan Comments:         Anesthesia Quick Evaluation

## 2022-04-13 NOTE — Interval H&P Note (Signed)
History and Physical Interval Note:  04/13/2022 6:58 AM  Maria Diaz  has presented today for surgery, with the diagnosis of LEFT BREAST CANCER.  The various methods of treatment have been discussed with the patient and family. After consideration of risks, benefits and other options for treatment, the patient has consented to  Procedure(s): LEFT BREAST BRACKETED LUMPECTOMY WITH RADIOACTIVE SEED AND AXILLARY SENTINEL LYMPH NODE BIOPSY (Left) as a surgical intervention.  The patient's history has been reviewed, patient examined, no change in status, stable for surgery.  I have reviewed the patient's chart and labs.  Questions were answered to the patient's satisfaction.     Rolm Bookbinder

## 2022-04-13 NOTE — Progress Notes (Signed)
Assisted Dr. Rodman Comp with left, pectoralis, ultrasound guided block. Side rails up, monitors on throughout procedure. See vital signs in flow sheet. Tolerated Procedure well.

## 2022-04-14 ENCOUNTER — Other Ambulatory Visit: Payer: Self-pay

## 2022-04-14 ENCOUNTER — Encounter (HOSPITAL_BASED_OUTPATIENT_CLINIC_OR_DEPARTMENT_OTHER): Payer: Self-pay | Admitting: General Surgery

## 2022-04-15 LAB — SURGICAL PATHOLOGY
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
## 2022-04-16 ENCOUNTER — Ambulatory Visit (INDEPENDENT_AMBULATORY_CARE_PROVIDER_SITE_OTHER): Payer: 59 | Admitting: Psychiatry

## 2022-04-16 ENCOUNTER — Encounter: Payer: Self-pay | Admitting: *Deleted

## 2022-04-16 DIAGNOSIS — F3341 Major depressive disorder, recurrent, in partial remission: Secondary | ICD-10-CM | POA: Diagnosis not present

## 2022-04-16 DIAGNOSIS — C50212 Malignant neoplasm of upper-inner quadrant of left female breast: Secondary | ICD-10-CM

## 2022-04-16 DIAGNOSIS — F4310 Post-traumatic stress disorder, unspecified: Secondary | ICD-10-CM | POA: Diagnosis not present
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
## 2022-04-17 ENCOUNTER — Encounter: Payer: Self-pay | Admitting: Psychiatry

## 2022-04-17 ENCOUNTER — Ambulatory Visit: Payer: 59 | Admitting: Hematology and Oncology

## 2022-04-17 ENCOUNTER — Telehealth (INDEPENDENT_AMBULATORY_CARE_PROVIDER_SITE_OTHER): Payer: 59 | Admitting: Psychiatry

## 2022-04-17 DIAGNOSIS — G4700 Insomnia, unspecified: Secondary | ICD-10-CM

## 2022-04-17 DIAGNOSIS — F3342 Major depressive disorder, recurrent, in full remission: Secondary | ICD-10-CM

## 2022-04-17 DIAGNOSIS — F4310 Post-traumatic stress disorder, unspecified: Secondary | ICD-10-CM

## 2022-04-17 MED ORDER — TRAZODONE HCL 50 MG PO TABS: 25.0000 mg | ORAL_TABLET | Freq: Every evening | ORAL | 1 refills | Status: DC | PRN

## 2022-04-17 MED ORDER — PRAZOSIN HCL 2 MG PO CAPS: 2.0000 mg | ORAL_CAPSULE | Freq: Every day | ORAL | 1 refills | Status: DC

## 2022-04-17 NOTE — Patient Instructions (Signed)
Continue sertraline 200 mg daily  Continue hydroxyzine 25 mg daily as needed for anxiety  Continue prazosin 2 mg at night Start Trazodone 25-50 mg at night  Next appointment- 7/7 at 11 AM, video

## 2022-04-21 ENCOUNTER — Ambulatory Visit (HOSPITAL_BASED_OUTPATIENT_CLINIC_OR_DEPARTMENT_OTHER)
Admission: RE | Admit: 2022-04-21 | Discharge: 2022-04-21 | Disposition: A | Discharge status: Home / Self Care | Payer: 59 | Attending: Plastic Surgery | Admitting: Plastic Surgery

## 2022-04-21 ENCOUNTER — Encounter (HOSPITAL_BASED_OUTPATIENT_CLINIC_OR_DEPARTMENT_OTHER): Payer: Self-pay | Admitting: Plastic Surgery

## 2022-04-21 ENCOUNTER — Other Ambulatory Visit: Payer: Self-pay

## 2022-04-21 ENCOUNTER — Ambulatory Visit (HOSPITAL_BASED_OUTPATIENT_CLINIC_OR_DEPARTMENT_OTHER): Payer: 59 | Admitting: Anesthesiology

## 2022-04-21 ENCOUNTER — Encounter (HOSPITAL_BASED_OUTPATIENT_CLINIC_OR_DEPARTMENT_OTHER)
Admission: RE | Disposition: A | Discharge status: Home / Self Care | Payer: Self-pay | Source: Home / Self Care | Attending: Plastic Surgery

## 2022-04-21 DIAGNOSIS — C50212 Malignant neoplasm of upper-inner quadrant of left female breast: Secondary | ICD-10-CM

## 2022-04-21 DIAGNOSIS — F419 Anxiety disorder, unspecified: Secondary | ICD-10-CM | POA: Insufficient documentation

## 2022-04-21 DIAGNOSIS — Z9221 Personal history of antineoplastic chemotherapy: Secondary | ICD-10-CM | POA: Insufficient documentation

## 2022-04-21 DIAGNOSIS — N6021 Fibroadenosis of right breast: Secondary | ICD-10-CM | POA: Insufficient documentation

## 2022-04-21 DIAGNOSIS — F32A Depression, unspecified: Secondary | ICD-10-CM | POA: Insufficient documentation

## 2022-04-21 DIAGNOSIS — M96843 Postprocedural seroma of a musculoskeletal structure following other procedure: Secondary | ICD-10-CM | POA: Diagnosis not present

## 2022-04-21 DIAGNOSIS — N6022 Fibroadenosis of left breast: Secondary | ICD-10-CM | POA: Insufficient documentation

## 2022-04-21 DIAGNOSIS — Z01818 Encounter for other preprocedural examination: Secondary | ICD-10-CM

## 2022-04-21 DIAGNOSIS — Z421 Encounter for breast reconstruction following mastectomy: Secondary | ICD-10-CM | POA: Diagnosis present

## 2022-04-21 DIAGNOSIS — Z853 Personal history of malignant neoplasm of breast: Secondary | ICD-10-CM | POA: Insufficient documentation

## 2022-04-21 DIAGNOSIS — Y838 Other surgical procedures as the cause of abnormal reaction of the patient, or of later complication, without mention of misadventure at the time of the procedure: Secondary | ICD-10-CM | POA: Diagnosis not present

## 2022-04-21 DIAGNOSIS — R920 Mammographic microcalcification found on diagnostic imaging of breast: Secondary | ICD-10-CM | POA: Insufficient documentation

## 2022-04-21 DIAGNOSIS — Z170 Estrogen receptor positive status [ER+]: Secondary | ICD-10-CM

## 2022-04-21 HISTORY — DX: Urticaria, unspecified: L50.9

## 2022-04-21 HISTORY — PX: MASTOPEXY: SHX5358

## 2022-04-21 LAB — POCT PREGNANCY, URINE: Preg Test, Ur: NEGATIVE

## 2022-04-21 SURGERY — MASTOPEXY
Anesthesia: General | Site: Breast | Laterality: Bilateral

## 2022-04-21 MED ORDER — CHLORHEXIDINE GLUCONATE CLOTH 2 % EX PADS: 6.0000 | MEDICATED_PAD | Freq: Once | CUTANEOUS | Status: DC

## 2022-04-21 MED ORDER — OXYCODONE HCL 5 MG PO TABS: 5.0000 mg | ORAL_TABLET | ORAL | 0 refills | Status: DC | PRN

## 2022-04-21 MED ORDER — MIDAZOLAM HCL 5 MG/5ML IJ SOLN
INTRAMUSCULAR | Status: DC | PRN
  Administered 2022-04-21: 2 mg via INTRAVENOUS

## 2022-04-21 MED ORDER — LIDOCAINE 2% (20 MG/ML) 5 ML SYRINGE
INTRAMUSCULAR | Status: AC
  Filled 2022-04-21: qty 5

## 2022-04-21 MED ORDER — MIDAZOLAM HCL 2 MG/2ML IJ SOLN
INTRAMUSCULAR | Status: AC
  Filled 2022-04-21: qty 2

## 2022-04-21 MED ORDER — PROPOFOL 500 MG/50ML IV EMUL
INTRAVENOUS | Status: AC
Start: 2022-04-21 — End: ?
  Filled 2022-04-21: qty 50

## 2022-04-21 MED ORDER — ONDANSETRON HCL 4 MG/2ML IJ SOLN: 4.0000 mg | Freq: Four times a day (QID) | INTRAMUSCULAR | Status: DC | PRN

## 2022-04-21 MED ORDER — FENTANYL CITRATE (PF) 100 MCG/2ML IJ SOLN
INTRAMUSCULAR | Status: DC | PRN
  Administered 2022-04-21: 25 ug via INTRAVENOUS
  Administered 2022-04-21 (×3): 50 ug via INTRAVENOUS
  Administered 2022-04-21: 25 ug via INTRAVENOUS
  Administered 2022-04-21 (×2): 50 ug via INTRAVENOUS

## 2022-04-21 MED ORDER — LIDOCAINE HCL (CARDIAC) PF 100 MG/5ML IV SOSY
PREFILLED_SYRINGE | INTRAVENOUS | Status: DC | PRN
  Administered 2022-04-21: 50 mg via INTRAVENOUS

## 2022-04-21 MED ORDER — BUPIVACAINE-EPINEPHRINE (PF) 0.25% -1:200000 IJ SOLN
INTRAMUSCULAR | Status: AC
  Filled 2022-04-21: qty 30

## 2022-04-21 MED ORDER — DEXAMETHASONE SODIUM PHOSPHATE 4 MG/ML IJ SOLN
INTRAMUSCULAR | Status: DC | PRN
  Administered 2022-04-21: 10 mg via INTRAVENOUS

## 2022-04-21 MED ORDER — 0.9 % SODIUM CHLORIDE (POUR BTL) OPTIME
TOPICAL | Status: DC | PRN
  Administered 2022-04-21: 1000 mL

## 2022-04-21 MED ORDER — ONDANSETRON HCL 4 MG/2ML IJ SOLN
INTRAMUSCULAR | Status: AC
  Filled 2022-04-21: qty 2

## 2022-04-21 MED ORDER — VANCOMYCIN HCL IN DEXTROSE 1-5 GM/200ML-% IV SOLN
1000.0000 mg | Freq: Once | INTRAVENOUS | Status: AC
  Administered 2022-04-21: 1000 mg via INTRAVENOUS

## 2022-04-21 MED ORDER — ROCURONIUM BROMIDE 10 MG/ML (PF) SYRINGE
PREFILLED_SYRINGE | INTRAVENOUS | Status: AC
  Filled 2022-04-21: qty 10

## 2022-04-21 MED ORDER — PROPOFOL 500 MG/50ML IV EMUL
INTRAVENOUS | Status: AC
  Filled 2022-04-21: qty 50

## 2022-04-21 MED ORDER — PROPOFOL 500 MG/50ML IV EMUL
INTRAVENOUS | Status: DC | PRN
  Administered 2022-04-21: 25 ug/kg/min via INTRAVENOUS

## 2022-04-21 MED ORDER — ONDANSETRON HCL 4 MG/2ML IJ SOLN
INTRAMUSCULAR | Status: DC | PRN
  Administered 2022-04-21: 4 mg via INTRAVENOUS

## 2022-04-21 MED ORDER — CELECOXIB 200 MG PO CAPS
200.0000 mg | ORAL_CAPSULE | ORAL | Status: AC
  Administered 2022-04-21: 200 mg via ORAL

## 2022-04-21 MED ORDER — FENTANYL CITRATE (PF) 100 MCG/2ML IJ SOLN
25.0000 ug | INTRAMUSCULAR | Status: DC | PRN
  Administered 2022-04-21 (×2): 50 ug via INTRAVENOUS

## 2022-04-21 MED ORDER — OXYCODONE HCL 5 MG/5ML PO SOLN: 5.0000 mg | Freq: Once | ORAL | Status: AC | PRN

## 2022-04-21 MED ORDER — SUGAMMADEX SODIUM 200 MG/2ML IV SOLN
INTRAVENOUS | Status: DC | PRN
  Administered 2022-04-21: 200 mg via INTRAVENOUS

## 2022-04-21 MED ORDER — PROPOFOL 10 MG/ML IV BOLUS
INTRAVENOUS | Status: AC
  Filled 2022-04-21: qty 20

## 2022-04-21 MED ORDER — PROPOFOL 10 MG/ML IV BOLUS
INTRAVENOUS | Status: DC | PRN
  Administered 2022-04-21: 170 mg via INTRAVENOUS

## 2022-04-21 MED ORDER — FENTANYL CITRATE (PF) 100 MCG/2ML IJ SOLN
INTRAMUSCULAR | Status: AC
  Filled 2022-04-21: qty 2

## 2022-04-21 MED ORDER — ROCURONIUM BROMIDE 100 MG/10ML IV SOLN
INTRAVENOUS | Status: DC | PRN
  Administered 2022-04-21: 20 mg via INTRAVENOUS
  Administered 2022-04-21: 50 mg via INTRAVENOUS
  Administered 2022-04-21: 20 mg via INTRAVENOUS

## 2022-04-21 MED ORDER — OXYCODONE HCL 5 MG PO TABS
ORAL_TABLET | ORAL | Status: AC
  Filled 2022-04-21: qty 1

## 2022-04-21 MED ORDER — PHENYLEPHRINE HCL (PRESSORS) 10 MG/ML IV SOLN
INTRAVENOUS | Status: DC | PRN
  Administered 2022-04-21 (×3): 80 ug via INTRAVENOUS

## 2022-04-21 MED ORDER — DEXAMETHASONE SODIUM PHOSPHATE 10 MG/ML IJ SOLN
INTRAMUSCULAR | Status: AC
  Filled 2022-04-21: qty 1

## 2022-04-21 MED ORDER — CELECOXIB 200 MG PO CAPS
ORAL_CAPSULE | ORAL | Status: AC
Start: 2022-04-21 — End: ?
  Filled 2022-04-21: qty 1

## 2022-04-21 MED ORDER — LACTATED RINGERS IV SOLN: INTRAVENOUS | Status: DC

## 2022-04-21 MED ORDER — GABAPENTIN 300 MG PO CAPS
ORAL_CAPSULE | ORAL | Status: AC
  Filled 2022-04-21: qty 1

## 2022-04-21 MED ORDER — PHENYLEPHRINE 80 MCG/ML (10ML) SYRINGE FOR IV PUSH (FOR BLOOD PRESSURE SUPPORT)
PREFILLED_SYRINGE | INTRAVENOUS | Status: AC
  Filled 2022-04-21: qty 10

## 2022-04-21 MED ORDER — GABAPENTIN 300 MG PO CAPS
300.0000 mg | ORAL_CAPSULE | ORAL | Status: AC
  Administered 2022-04-21: 300 mg via ORAL

## 2022-04-21 MED ORDER — VANCOMYCIN HCL IN DEXTROSE 1-5 GM/200ML-% IV SOLN
INTRAVENOUS | Status: AC
  Filled 2022-04-21: qty 200

## 2022-04-21 MED ORDER — OXYCODONE HCL 5 MG PO TABS
5.0000 mg | ORAL_TABLET | Freq: Once | ORAL | Status: AC | PRN
  Administered 2022-04-21: 5 mg via ORAL

## 2022-04-21 MED ORDER — ACETAMINOPHEN 500 MG PO TABS
ORAL_TABLET | ORAL | Status: AC
  Filled 2022-04-21: qty 2

## 2022-04-21 MED ORDER — BUPIVACAINE-EPINEPHRINE 0.25% -1:200000 IJ SOLN
INTRAMUSCULAR | Status: DC | PRN
  Administered 2022-04-21: 30 mL

## 2022-04-21 MED ORDER — ACETAMINOPHEN 500 MG PO TABS
1000.0000 mg | ORAL_TABLET | ORAL | Status: AC
  Administered 2022-04-21: 1000 mg via ORAL

## 2022-04-21 SURGICAL SUPPLY — 65 items
ADH SKN CLS APL DERMABOND .7 (GAUZE/BANDAGES/DRESSINGS) ×2
APL PRP STRL LF DISP 70% ISPRP (MISCELLANEOUS) ×2
BAG DECANTER FOR FLEXI CONT (MISCELLANEOUS) ×1 IMPLANT
BINDER BREAST LRG (GAUZE/BANDAGES/DRESSINGS) IMPLANT
BINDER BREAST MEDIUM (GAUZE/BANDAGES/DRESSINGS) IMPLANT
BINDER BREAST XLRG (GAUZE/BANDAGES/DRESSINGS) ×1 IMPLANT
BINDER BREAST XXLRG (GAUZE/BANDAGES/DRESSINGS) IMPLANT
BLADE SURG 10 STRL SS (BLADE) ×5 IMPLANT
BLADE SURG 15 STRL LF DISP TIS (BLADE) IMPLANT
BLADE SURG 15 STRL SS (BLADE)
BNDG GAUZE ELAST 4 BULKY (GAUZE/BANDAGES/DRESSINGS) ×4 IMPLANT
CANISTER SUCT 1200ML W/VALVE (MISCELLANEOUS) ×2 IMPLANT
CHLORAPREP W/TINT 26 (MISCELLANEOUS) ×3 IMPLANT
COVER MAYO STAND STRL (DRAPES) ×2 IMPLANT
DERMABOND ADVANCED (GAUZE/BANDAGES/DRESSINGS) ×2
DERMABOND ADVANCED .7 DNX12 (GAUZE/BANDAGES/DRESSINGS) ×1 IMPLANT
DRAIN CHANNEL 15F RND FF W/TCR (WOUND CARE) ×2 IMPLANT
DRAIN CHANNEL 19F RND (DRAIN) IMPLANT
DRAPE TOP ARMCOVERS (MISCELLANEOUS) ×2 IMPLANT
DRAPE U-SHAPE 76X120 STRL (DRAPES) ×2 IMPLANT
DRAPE UTILITY XL STRL (DRAPES) ×3 IMPLANT
DRSG PAD ABDOMINAL 8X10 ST (GAUZE/BANDAGES/DRESSINGS) ×4 IMPLANT
DRSG TEGADERM 2-3/8X2-3/4 SM (GAUZE/BANDAGES/DRESSINGS) IMPLANT
ELECT BLADE 4.0 EZ CLEAN MEGAD (MISCELLANEOUS)
ELECT COATED BLADE 2.86 ST (ELECTRODE) ×2 IMPLANT
ELECT REM PT RETURN 9FT ADLT (ELECTROSURGICAL) ×2
ELECTRODE BLDE 4.0 EZ CLN MEGD (MISCELLANEOUS) IMPLANT
ELECTRODE REM PT RTRN 9FT ADLT (ELECTROSURGICAL) ×1 IMPLANT
EVACUATOR SILICONE 100CC (DRAIN) ×2 IMPLANT
GAUZE SPONGE 4X4 12PLY STRL (GAUZE/BANDAGES/DRESSINGS) IMPLANT
GAUZE SPONGE 4X4 12PLY STRL LF (GAUZE/BANDAGES/DRESSINGS) IMPLANT
GLOVE BIO SURGEON STRL SZ 6 (GLOVE) ×3 IMPLANT
GLOVE BIOGEL PI IND STRL 7.0 (GLOVE) IMPLANT
GLOVE BIOGEL PI INDICATOR 7.0 (GLOVE) ×2
GOWN STRL REUS W/ TWL LRG LVL3 (GOWN DISPOSABLE) ×2 IMPLANT
GOWN STRL REUS W/TWL LRG LVL3 (GOWN DISPOSABLE) ×4
MARKER SKIN DUAL TIP RULER LAB (MISCELLANEOUS) IMPLANT
MAT PREVALON FULL STRYKER (MISCELLANEOUS) ×1 IMPLANT
NDL HYPO 25X1 1.5 SAFETY (NEEDLE) IMPLANT
NEEDLE HYPO 25X1 1.5 SAFETY (NEEDLE) ×2 IMPLANT
NS IRRIG 1000ML POUR BTL (IV SOLUTION) ×2 IMPLANT
PACK BASIN DAY SURGERY FS (CUSTOM PROCEDURE TRAY) ×2 IMPLANT
PENCIL SMOKE EVACUATOR (MISCELLANEOUS) ×2 IMPLANT
PIN SAFETY STERILE (MISCELLANEOUS) ×1 IMPLANT
SHEET MEDIUM DRAPE 40X70 STRL (DRAPES) ×1 IMPLANT
SLEEVE SCD COMPRESS KNEE MED (STOCKING) ×2 IMPLANT
SPIKE FLUID TRANSFER (MISCELLANEOUS) IMPLANT
SPONGE T-LAP 18X18 ~~LOC~~+RFID (SPONGE) ×4 IMPLANT
STAPLER VISISTAT 35W (STAPLE) ×2 IMPLANT
STRIP CLOSURE SKIN 1/2X4 (GAUZE/BANDAGES/DRESSINGS) IMPLANT
SUT ETHILON 2 0 FS 18 (SUTURE) ×1 IMPLANT
SUT MNCRL AB 4-0 PS2 18 (SUTURE) ×5 IMPLANT
SUT PDS AB 2-0 CT2 27 (SUTURE) ×1 IMPLANT
SUT VIC AB 3-0 PS1 18 (SUTURE) ×16
SUT VIC AB 3-0 PS1 18XBRD (SUTURE) ×1 IMPLANT
SUT VIC AB 3-0 SH 27 (SUTURE)
SUT VIC AB 3-0 SH 27X BRD (SUTURE) IMPLANT
SUT VICRYL 4-0 PS2 18IN ABS (SUTURE) ×3 IMPLANT
SYR BULB IRRIG 60ML STRL (SYRINGE) ×2 IMPLANT
SYR CONTROL 10ML LL (SYRINGE) ×1 IMPLANT
TAPE MEASURE VINYL STERILE (MISCELLANEOUS) ×2 IMPLANT
TOWEL GREEN STERILE FF (TOWEL DISPOSABLE) ×4 IMPLANT
TUBE CONNECTING 20X1/4 (TUBING) ×2 IMPLANT
UNDERPAD 30X36 HEAVY ABSORB (UNDERPADS AND DIAPERS) ×4 IMPLANT
YANKAUER SUCT BULB TIP NO VENT (SUCTIONS) ×2 IMPLANT

## 2022-04-21 NOTE — Op Note (Signed)
Operative Note   DATE OF OPERATION: 5.30.23  LOCATION: [facility]-outpatient  SURGICAL DIVISION: Plastic Surgery  PREOPERATIVE DIAGNOSES:  1. Left breast ca UIQ ER+  POSTOPERATIVE DIAGNOSES:  same  PROCEDURE:  Left oncoplastic breast reconstruction, right breast mastopexy  SURGEON: Irene Limbo MD MBA  ASSISTANT: none  ANESTHESIA:  General.   EBL: 50 ml  COMPLICATIONS: None immediate.   INDICATIONS FOR PROCEDURE:  The patient, Maria Diaz, is a 51 y.o. female born on 1970-11-28, is here for staged reconstruction breast following lumpectomy left.    FINDINGS: Right mastopexy 128 g Left mastopexy 122 g. Drained seroma from lumpectomy cavity over left.  DESCRIPTION OF PROCEDURE:  The patient was marked standing in the preoperative area to mark sternal notch, chest midline, anterior axillary lines, inframammary folds. The location of new nipple areolar complex was marked at level of on inframammary fold on anterior surface breast by palpation. This was marked symmetric over bilateral breasts. With aid of Wise pattern marker, location of new nipple areolar complex and vertical limbs (7 cm) were marked by displacement of breasts along meridian. The patient was taken to the operating room. SCDs were placed and IV antibiotics were given. The patient's operative site was prepped and draped in a sterile fashion. A time out was performed and all information was confirmed to be correct.     Over left breast, inferior pedicle marked and nipple areolar complex incised with 42 mm diameter. Pedicle deepithlialized and developed to chest wall. Medial and lateral flaps developed with breast tissue excised over medial and lateral triangles. Breast tailor tacked closed.    I then directed attention to right breast superior medial pedicle marked and nipple areolar complex incised with 42  mm diameter. Pedicle deepithlialized and developed to chest wall. Inferiorly based dermoglandular  pedicle preserved for use as auto augmentation. This was advanced superiorly and secured to chest wall with interrupted 2-0 PDS suture. Medial and lateral flaps developed. Breast tailor tacked closed, and patient brought to upright sitting position and assessed for symmetry. Patent returned to supine position. Breast cavities irrigated and hemostasis obtained. Local anesthetic infiltrated throughout each breast. 15 Fr JP placed in each breast and secured with 2-0 nylon. Closure completed bilateral with 3-0 vicryl to approximate dermis along inframammary fold and vertical limb. NAC inset with 4-0 vicryl in dermis. Skin closure completed with 4-0 monocryl subcuticular throughout. Tissue adhesive applied.  Dermabond applied.   Dry dressing, breast binder applied. The patient was allowed to wake from anesthesia, extubated and taken to the recovery room in satisfactory condition.   SPECIMENS: right and left breast mastopexy  DRAINS: 15 Fr JP in right and left breast  Irene Limbo, MD [facility] Plastic & Reconstructive Surgery  Office/ physician access line after hours [phone]

## 2022-04-21 NOTE — H&P (Signed)
Subjective:   Patient ID: Maria Diaz is a 51 y.o. female.  HPI  Presents for oncoplastic breast reconstruction. Presented with palpable left breast mass. MMG/US showed 2.9 cm mass left breast 1000 9 cmfn. Single LN left axilla with cortical thickening. Biopsy showed ER 40% weak, PR -, HER2 -. Biopsy LN benign and felt to be concordant.  MRI demonstrated a 3.1 cm mass in the left UIQ consistent with the patient's biopsy-proven malignancy. Additional 1.2 cm mass left UIQ mid depth noted, overall span of suspicious findings is greater than 6 cm. Numerous prominent in morphologically abnormal level 1 and 2 axillary lymph nodes bilaterally. One of these was previously biopsied with benign results. Given the bilaterality, findings may represent a lymphoproliferative process.   Completed neoadjuvant chemotherapy. Prior benign left breast excision 2022. Underwent lumpectomy with final pathology 0/3 SLN, no residual carcinoma.  Genetics negative.  Current 38 D. Wt stable. Denies rashes beneath breasts, denies back pain related to breast size.  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 Lives with spouse.  Review of Systems Remainder 12 point review negative  Objective:  Physical Exam Cardiovascular:  Rate and Rhythm: Normal rate. Normal heart sounds Pulmonary:  Effort: Pulmonary effort is normal. Clear to auscultation    Breasts: Grade 3 ptosis bilateral, left lateral areolar scar Sn to nipple R 30 L 30 cm BW R 20 L 20 cm Nipple to IMF R 14.5 L 14 cm   Assessment:   Left breast ca UIQ ER+ Neoadjuvant chemotherapy S/p left lumpectomy SLN  Plan:    Plan oncoplastic reconstruction 7-10 d post lumpectomy to ensure pathologic clearance. Reviewed reduction with anchor type scars, drains, post operative visits and limitations, recovery. Diminished sensation nipple and breast skin, risk of nipple loss, wound healing problems, asymmetry. Discussed will have some contraction of breast volume and  increased firmness with radiation, less ptosis with aging. This can result in asymmetries long term. Discussed changes with wt gain, loss, aging. Discussed lumpectomy alone can result in NAC displacement, distortion contour breast following lumpectomy and RT, asymmetry breast volume and NAC position. Reviewed purpose of this type reconstruction to prevent these. Reviewed breast lift or trying to correct NAC displacement post RT more difficult, higher risk complications. Counseled I cannot assure her cup size. Reviewed can defer surgery until after therapies complete. In this setting would wait at least 6 months from end RT for any surgery. Reviewed increased risks complications in setting RT. Reviewed any complications from oncoplastic reconstruction procedure may delay start RT.   Given span of disease on imaging I anticipate lumpectomy alone will result in significant asymmetry breasts. With regards to her question of how much breast tissue will be removed, counseled I would plan to remove over right bit more volume than lumpectomy as we anticipate left breast volume to contract with radiation. This may mean largely skin excision over left breast pending size of tumor excision.

## 2022-04-21 NOTE — Anesthesia Preprocedure Evaluation (Addendum)
Anesthesia Evaluation  Patient identified by MRN, date of birth, ID band Patient awake    Reviewed: Allergy & Precautions, H&P , NPO status , Patient's Chart, lab work & pertinent test results  Airway Mallampati: II   Neck ROM: full    Dental   Pulmonary neg pulmonary ROS,    breath sounds clear to auscultation       Cardiovascular negative cardio ROS   Rhythm:regular Rate:Normal     Neuro/Psych PSYCHIATRIC DISORDERS Anxiety Depression    GI/Hepatic   Endo/Other    Renal/GU      Musculoskeletal   Abdominal   Peds  Hematology   Anesthesia Other Findings   Reproductive/Obstetrics H/o breast CA                            Anesthesia Physical Anesthesia Plan  ASA: 2  Anesthesia Plan: General   Post-op Pain Management:    Induction: Intravenous  PONV Risk Score and Plan: 3 and Ondansetron, Dexamethasone, Midazolam and Treatment may vary due to age or medical condition  Airway Management Planned: LMA  Additional Equipment:   Intra-op Plan:   Post-operative Plan: Extubation in OR  Informed Consent: I have reviewed the patients History and Physical, chart, labs and discussed the procedure including the risks, benefits and alternatives for the proposed anesthesia with the patient or authorized representative who has indicated his/her understanding and acceptance.     Dental advisory given  Plan Discussed with: CRNA, Anesthesiologist and Surgeon  Anesthesia Plan Comments:        Anesthesia Quick Evaluation

## 2022-04-21 NOTE — Discharge Instructions (Addendum)
Oxycodone given at 1pm today  Post Anesthesia Home Care Instructions  Activity: Get plenty of rest for the remainder of the day. A responsible individual must stay with you for 24 hours following the procedure.  For the next 24 hours, DO NOT: -Drive a car -Paediatric nurse -Drink alcoholic beverages -Take any medication unless instructed by your physician -Make any legal decisions or sign important papers.  Meals: Start with liquid foods such as gelatin or soup. Progress to regular foods as tolerated. Avoid greasy, spicy, heavy foods. If nausea and/or vomiting occur, drink only clear liquids until the nausea and/or vomiting subsides. Call your physician if vomiting continues.  Special Instructions/Symptoms: Your throat may feel dry or sore from the anesthesia or the breathing tube placed in your throat during surgery. If this causes discomfort, gargle with warm salt water. The discomfort should disappear within 24 hours.  If you had a scopolamine patch placed behind your ear for the management of post- operative nausea and/or vomiting:  1. The medication in the patch is effective for 72 hours, after which it should be removed.  Wrap patch in a tissue and discard in the trash. Wash hands thoroughly with soap and water. 2. You may remove the patch earlier than 72 hours if you experience unpleasant side effects which may include dry mouth, dizziness or visual disturbances. 3. Avoid touching the patch. Wash your hands with soap and water after contact with the patch.        JP Drain [employer] this sheet to all of your post-operative appointments while you have your drains. Please measure your drains by CC's or ML's. Make sure you drain and measure your JP Drains 2 or 3 times per day. At the end of each day, add up totals for the left side and add up totals for the right side.    ( 9 am )     ( 3 pm )        ( 9 pm )                Date L  R  L  R  L  R  Total L/R

## 2022-04-21 NOTE — Transfer of Care (Signed)
Immediate Anesthesia Transfer of Care Note  Patient: Maria Diaz  Procedure(s) Performed: MASTOPEXY (Bilateral: Breast)  Patient Location: PACU  Anesthesia Type:General  Level of Consciousness: drowsy and patient cooperative  Airway & Oxygen Therapy: Patient Spontanous Breathing and Patient connected to face mask oxygen  Post-op Assessment: Report given to RN and Post -op Vital signs reviewed and stable  Post vital signs: Reviewed and stable  Last Vitals:  Vitals Value Taken Time  BP 133/84 04/21/22 1134  Temp    Pulse 94 04/21/22 1136  Resp 19 04/21/22 1136  SpO2 100 % 04/21/22 1136  Vitals shown include unvalidated device data.  Last Pain:  Vitals:   04/21/22 0640  TempSrc: Oral  PainSc: 0-No pain      Patients Stated Pain Goal: 4 (95/28/41 3244)  Complications: No notable events documented.

## 2022-04-21 NOTE — Anesthesia Procedure Notes (Signed)
Procedure Name: Intubation Date/Time: 04/21/2022 7:36 AM Performed by: Glory Buff, CRNA Pre-anesthesia Checklist: Patient identified, Emergency Drugs available, Suction available and Patient being monitored Patient Re-evaluated:Patient Re-evaluated prior to induction Oxygen Delivery Method: Circle system utilized Preoxygenation: Pre-oxygenation with 100% oxygen Induction Type: IV induction Ventilation: Mask ventilation without difficulty Laryngoscope Size: Miller and 3 Grade View: Grade I Tube type: Oral Tube size: 7.0 mm Number of attempts: 1 Airway Equipment and Method: Stylet and Oral airway Placement Confirmation: ETT inserted through vocal cords under direct vision, positive ETCO2 and breath sounds checked- equal and bilateral Secured at: 20 cm Tube secured with: Tape Dental Injury: Teeth and Oropharynx as per pre-operative assessment

## 2022-04-22 ENCOUNTER — Encounter (HOSPITAL_BASED_OUTPATIENT_CLINIC_OR_DEPARTMENT_OTHER): Payer: Self-pay | Admitting: Plastic Surgery

## 2022-04-23 ENCOUNTER — Other Ambulatory Visit: Payer: Self-pay

## 2022-04-23 ENCOUNTER — Inpatient Hospital Stay: Payer: 59 | Attending: Hematology and Oncology | Admitting: Hematology and Oncology

## 2022-04-23 ENCOUNTER — Encounter: Payer: Self-pay | Admitting: Hematology and Oncology

## 2022-04-23 DIAGNOSIS — D649 Anemia, unspecified: Secondary | ICD-10-CM | POA: Diagnosis not present

## 2022-04-23 DIAGNOSIS — C50212 Malignant neoplasm of upper-inner quadrant of left female breast: Secondary | ICD-10-CM | POA: Insufficient documentation

## 2022-04-23 DIAGNOSIS — Z170 Estrogen receptor positive status [ER+]: Secondary | ICD-10-CM | POA: Insufficient documentation

## 2022-04-23 LAB — SURGICAL PATHOLOGY

## 2022-04-23 NOTE — Assessment & Plan Note (Addendum)
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  Given tumor size close to 3 cm, we have discussed about TC neoadjuvantly. She completed neoadjuvant TC, had complete pathologic response to chemotherapy. She had bilateral reduction mammoplasty for symmetry, atypical hyperplasia in right breast, no evidence of malignancy. No role for adjuvant chemotherapy.  She will proceed with adjuvant radiation and return to clinic for antiestrogen therapy. She is agreeable to all the recommendations.

## 2022-04-23 NOTE — Progress Notes (Signed)
[facility] Follow up:    Maria Rio, MD [address] [address]   DIAGNOSIS:  Cancer Staging  Malignant neoplasm of upper-inner quadrant of left breast in female, estrogen receptor positive ([place]) Staging form: Breast, AJCC 8th Edition - Clinical: Stage IIB (cT2, cN0, cM0, G3, ER+, PR-, HER2-) - Unsigned Stage prefix: Initial diagnosis Histologic grading system: 3 grade system   SUMMARY OF ONCOLOGIC HISTORY: Oncology History  Malignant neoplasm of upper-inner quadrant of left breast in female, estrogen receptor positive ([place])  12/31/2021 Initial Diagnosis   Malignant neoplasm of upper-inner quadrant of left breast in female, estrogen receptor positive ([place])    01/08/2022 -  Chemotherapy   Patient is on Treatment Plan : BREAST TC q21d        CURRENT THERAPY: Taxotere/Cytoxan  INTERVAL HISTORY:  Maria Diaz 51 y.o. female returns for evaluation of stage IIb left breast cancer after her left lumpectomy as well as bilateral reduction mammoplasty.  She complains of some pain in the surgical region and indwelling drains, has an appointment with plastic surgery on Monday. She denies any other complaints today.  She has a follow-up scheduled with Dr. Salley Slaughter from radiation oncology on June 28. Rest of the pertinent 10 point ROS reviewed and negative.  Patient Active Problem List   Diagnosis Date Noted   Anemia 12/31/2021   Malignant neoplasm of upper-inner quadrant of left breast in female, estrogen receptor positive ([place]) 12/31/2021   Open angle with borderline findings and low glaucoma risk in both eyes 01/11/2016   Chronic idiopathic urticaria 01/11/2016   Age-related nuclear cataract of both eyes 01/11/2016   Eczema 01/11/2016    is allergic to haemophilus influenzae vaccines and penicillins.  MEDICAL HISTORY: Past Medical History:  Diagnosis Date   Anemia    Anxiety    Depression    Glaucoma    Hives    chronic, on Xolair  injections    SURGICAL HISTORY: Past Surgical History:  Procedure Laterality Date   BREAST BIOPSY Left 01/19/2022   BREAST CYST EXCISION Left 01/28/2021   Procedure: LEFT BREAST MASS EXCISION;  Surgeon: Rolm Bookbinder, MD;  Location: [facility];  Service: General;  Laterality: Left;  START TIME OF 3:00 PM FOR 60 MINUTES IN ROOM 8   BREAST LUMPECTOMY WITH RADIOACTIVE SEED AND SENTINEL LYMPH NODE BIOPSY Left 04/13/2022   Procedure: LEFT BREAST BRACKETED LUMPECTOMY WITH RADIOACTIVE SEED AND AXILLARY SENTINEL LYMPH NODE BIOPSY;  Surgeon: Rolm Bookbinder, MD;  Location: [facility];  Service: General;  Laterality: Left;   HYSTEROSCOPY W/ ENDOMETRIAL ABLATION  08/14/2020   [facility]   MASTOPEXY Bilateral 04/21/2022   Procedure: MASTOPEXY;  Surgeon: Irene Limbo, MD;  Location: [facility];  Service: Plastics;  Laterality: Bilateral;    SOCIAL HISTORY: Social History   Socioeconomic History   Marital status: Divorced    Spouse name: Not on file   Number of children: Not on file   Years of education: Not on file   Highest education level: Not on file  Occupational History   Not on file  Tobacco Use   Smoking status: Never   Smokeless tobacco: Never  Substance and Sexual Activity   Alcohol use: Yes    Alcohol/week: 1.0 standard drink    Types: 1 Glasses of wine per week    Comment: holidays   Drug use: Never   Sexual activity: Yes    Birth control/protection: None  Comment: ablation  Other Topics Concern   Not on file  Social History Narrative   Not on file   Social Determinants of Health   Financial Resource Strain: Not on file  Food Insecurity: Not on file  Transportation Needs: Not on file  Physical Activity: Not on file  Stress: Not on file  Social Connections: Not on file  Intimate Partner Violence: Not on file    FAMILY HISTORY: Family History  Problem Relation Age of Onset   Anxiety disorder Sister    Drug  abuse Mother     Review of Systems  Constitutional:  Negative for appetite change, chills, fatigue, fever and unexpected weight change.  HENT:   Negative for hearing loss, lump/mass and trouble swallowing.   Eyes:  Negative for eye problems and icterus.  Respiratory:  Negative for chest tightness, cough and shortness of breath.   Cardiovascular:  Negative for chest pain, leg swelling and palpitations.  Gastrointestinal:  Negative for abdominal distention, abdominal pain, constipation, diarrhea, nausea and vomiting.  Endocrine: Negative for hot flashes.  Genitourinary:  Negative for difficulty urinating.   Musculoskeletal:  Negative for arthralgias.  Skin:  Negative for itching and rash.  Neurological:  Negative for dizziness, extremity weakness, headaches and numbness.  Hematological:  Negative for adenopathy. Does not bruise/bleed easily.  Psychiatric/Behavioral:  Negative for depression. The patient is not nervous/anxious.      PHYSICAL EXAMINATION  ECOG PERFORMANCE STATUS: 1 - Symptomatic but completely ambulatory  Vitals:   04/23/22 1558  BP: 111/71  Pulse: 79  Temp: 98.1 F (36.7 C)  SpO2: 98%    Physical Exam Constitutional:      Appearance: Normal appearance.  HENT:     Head: Normocephalic and atraumatic.  Eyes:     General: No scleral icterus. Chest:       Comments: Bilateral mammoplasty scars noted, still healing.  Indwelling drains noted. Musculoskeletal:        General: No swelling.  Skin:    General: Skin is warm and dry.     Findings: No rash.  Neurological:     Mental Status: She is alert.  Psychiatric:        Mood and Affect: Mood normal.    LABORATORY DATA:  CBC    Component Value Date/Time   WBC 12.1 (H) 03/12/2022 0826   WBC 8.0 07/20/2020 2320   RBC 4.13 03/12/2022 0826   HGB 12.7 03/12/2022 0826   HCT 37.8 03/12/2022 0826   PLT 314 03/12/2022 0826   MCV 91.5 03/12/2022 0826   MCH 30.8 03/12/2022 0826   MCHC 33.6 03/12/2022 0826    RDW 14.4 03/12/2022 0826   LYMPHSABS 1.2 03/12/2022 0826   MONOABS 0.9 03/12/2022 0826   EOSABS 0.0 03/12/2022 0826   BASOSABS 0.0 03/12/2022 0826    CMP     Component Value Date/Time   NA 140 03/12/2022 0826   K 3.8 03/12/2022 0826   CL 111 03/12/2022 0826   CO2 24 03/12/2022 0826   GLUCOSE 107 (H) 03/12/2022 0826   BUN 15 03/12/2022 0826   CREATININE 0.71 03/12/2022 0826   CALCIUM 9.8 03/12/2022 0826   PROT 7.7 03/12/2022 0826   ALBUMIN 4.2 03/12/2022 0826   AST 11 (L) 03/12/2022 0826   ALT 9 03/12/2022 0826   ALKPHOS 71 03/12/2022 0826   BILITOT 0.3 03/12/2022 0826   GFRNONAA >60 03/12/2022 0826   GFRAA >60 07/20/2020 2320    ASSESSMENT and THERAPY PLAN:   Malignant neoplasm of  upper-inner quadrant of left breast in female, estrogen receptor positive ([place]) This is a very pleasant 51 year old female patient with 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  Given tumor size close to 3 cm, we have discussed about TC neoadjuvantly. She completed neoadjuvant TC, had complete pathologic response to chemotherapy. She had bilateral reduction mammoplasty for symmetry, atypical hyperplasia in right breast, no evidence of malignancy. No role for adjuvant chemotherapy.  She will proceed with adjuvant radiation and return to clinic for antiestrogen therapy. She is agreeable to all the recommendations.  Total time spent: 30 minutes including history, physical exam, review of records, pathology, counseling about for future plan and coordination of care *Total Encounter Time as defined by the Centers for Medicare and Medicaid Services includes, in addition to the face-to-face time of a patient visit (documented in the note above) non-face-to-face  time: obtaining and reviewing outside history, ordering and reviewing medications, tests or procedures, care coordination (communications with other health care professionals or caregivers) and documentation in the medical record.

## 2022-04-27 ENCOUNTER — Encounter: Payer: Self-pay | Admitting: *Deleted

## 2022-04-30 ENCOUNTER — Encounter (HOSPITAL_COMMUNITY): Payer: Self-pay

## 2022-04-30 ENCOUNTER — Ambulatory Visit (INDEPENDENT_AMBULATORY_CARE_PROVIDER_SITE_OTHER): Payer: 59 | Admitting: Psychiatry

## 2022-04-30 DIAGNOSIS — F3342 Major depressive disorder, recurrent, in full remission: Secondary | ICD-10-CM

## 2022-04-30 DIAGNOSIS — F4310 Post-traumatic stress disorder, unspecified: Secondary | ICD-10-CM | POA: Diagnosis not present
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
## 2022-05-06 NOTE — Anesthesia Postprocedure Evaluation (Signed)
Anesthesia Post Note  Patient: Maria Diaz  Procedure(s) Performed: MASTOPEXY (Bilateral: Breast)     Patient location during evaluation: PACU Anesthesia Type: General Level of consciousness: awake and alert Pain management: pain level controlled Vital Signs Assessment: post-procedure vital signs reviewed and stable Respiratory status: spontaneous breathing, nonlabored ventilation, respiratory function stable and patient connected to nasal cannula oxygen Cardiovascular status: blood pressure returned to baseline and stable Postop Assessment: no apparent nausea or vomiting Anesthetic complications: no   No notable events documented.  Last Vitals:  Vitals:   04/21/22 1230 04/21/22 1255  BP: 119/75 (!) 144/81  Pulse: 83 86  Resp: 15 18  Temp:  37.1 C  SpO2: 100% 97%    Last Pain:  Vitals:   04/22/22 0847  TempSrc:   PainSc: [place]

## 2022-05-07 ENCOUNTER — Encounter: Payer: Self-pay | Admitting: Physical Therapy

## 2022-05-07 ENCOUNTER — Ambulatory Visit: Payer: 59 | Attending: Hematology and Oncology | Admitting: Physical Therapy

## 2022-05-07 DIAGNOSIS — M6281 Muscle weakness (generalized): Secondary | ICD-10-CM | POA: Diagnosis present

## 2022-05-07 DIAGNOSIS — R293 Abnormal posture: Secondary | ICD-10-CM | POA: Insufficient documentation

## 2022-05-07 DIAGNOSIS — M25612 Stiffness of left shoulder, not elsewhere classified: Secondary | ICD-10-CM | POA: Diagnosis present

## 2022-05-07 DIAGNOSIS — Z483 Aftercare following surgery for neoplasm: Secondary | ICD-10-CM | POA: Diagnosis present

## 2022-05-07 DIAGNOSIS — Z171 Estrogen receptor negative status [ER-]: Secondary | ICD-10-CM | POA: Insufficient documentation

## 2022-05-07 DIAGNOSIS — C50212 Malignant neoplasm of upper-inner quadrant of left female breast: Secondary | ICD-10-CM | POA: Diagnosis present

## 2022-05-07 NOTE — Therapy (Signed)
OUTPATIENT PHYSICAL THERAPY BREAST CANCER POST OP FOLLOW UP   Patient Name: Maria Diaz MRN: [id number] DOB:1971-09-18, 51 y.o., female Today's Date: 05/07/2022   PT End of Session - 05/07/22 1509     Visit Number 2    Number of Visits 10    Date for PT Re-Evaluation 06/04/22    PT Start Time 1508    PT Stop Time 1540    PT Time Calculation (min) 32 min    Activity Tolerance Patient tolerated treatment well    Behavior During Therapy WFL for tasks assessed/performed             Past Medical History:  Diagnosis Date   Anemia    Anxiety    Depression    Glaucoma    Hives    chronic, on Xolair injections   Past Surgical History:  Procedure Laterality Date   BREAST BIOPSY Left 01/19/2022   BREAST CYST EXCISION Left 01/28/2021   Procedure: LEFT BREAST MASS EXCISION;  Surgeon: Rolm Bookbinder, MD;  Location: [place];  Service: General;  Laterality: Left;  START TIME OF 3:00 PM FOR 60 MINUTES IN ROOM 8   BREAST LUMPECTOMY WITH RADIOACTIVE SEED AND SENTINEL LYMPH NODE BIOPSY Left 04/13/2022   Procedure: LEFT BREAST BRACKETED LUMPECTOMY WITH RADIOACTIVE SEED AND AXILLARY SENTINEL LYMPH NODE BIOPSY;  Surgeon: Rolm Bookbinder, MD;  Location: [facility] [facility];  Service: General;  Laterality: Left;   HYSTEROSCOPY W/ ENDOMETRIAL ABLATION  08/14/2020   [facility]   MASTOPEXY Bilateral 04/21/2022   Procedure: MASTOPEXY;  Surgeon: Irene Limbo, MD;  Location: [place];  Service: Plastics;  Laterality: Bilateral;   Patient Active Problem List   Diagnosis Date Noted   Anemia 12/31/2021   Malignant neoplasm of upper-inner quadrant of left breast in female, estrogen receptor positive ([place]) 12/31/2021   Open angle with borderline findings and low glaucoma risk in both eyes 01/11/2016   Chronic idiopathic urticaria 01/11/2016   Age-related nuclear cataract of both eyes 01/11/2016   Eczema 01/11/2016    PCP: [name], MD    REFERRING PROVIDER: Rolm Bookbinder, MD   REFERRING DIAG: [phone] (ICD-10-CM) - Malignant neoplasm of upper-inner quadrant of left female breast   THERAPY DIAG:  Stiffness of left shoulder, not elsewhere classified  Muscle weakness (generalized)  Aftercare following surgery for neoplasm  Abnormal posture  Malignant neoplasm of upper-inner quadrant of left breast in female, estrogen receptor negative ([place])  Rationale for Evaluation and Treatment Rehabilitation  ONSET DATE: 12/09/21  SUBJECTIVE:  SUBJECTIVE STATEMENT: I am having tingling throughout entire LUE since my lumpectomy. I feel like it takes a lot out of me to raise my arms overhead.   PERTINENT HISTORY:  Patient was diagnosed on 12/09/21 with left grade 3. It measures 3 cm and is located in the upper inner quadrant. 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 04/13/22- underwent L breast lumpectomy and SLNB (0/3) 04/21/22- bilateral mastopexy    PATIENT GOALS:  Reassess how my recovery is going related to arm function, pain, and swelling.  PAIN:  Are you having pain? Yes: NPRS scale: 7/10 Pain location: bilateral chest Pain description: sore Aggravating factors: wearing a bra, pressure on the area Relieving factors: nothing  PRECAUTIONS: Recent Surgery, left UE Lymphedema risk,   ACTIVITY LEVEL / LEISURE: been trying to do ROM exercises, pt reports she has been walking a half a mile several times recently   OBJECTIVE:   PATIENT SURVEYS:  QUICK DASH:  Quick Dash - 05/07/22 0001     Open a tight or new jar Moderate difficulty    Do heavy household chores (wash walls, wash floors) Moderate difficulty    Carry a shopping bag or briefcase Moderate difficulty    Wash your back Severe difficulty    Use a knife  to cut food Moderate difficulty    Recreational activities in which you take some force or impact through your arm, shoulder, or hand (golf, hammering, tennis) Severe difficulty    During the past week, to what extent has your arm, shoulder or hand problem interfered with your normal social activities with family, friends, neighbors, or groups? Modererately    During the past week, to what extent has your arm, shoulder or hand problem limited your work or other regular daily activities Modererately    Arm, shoulder, or hand pain. Moderate    Tingling (pins and needles) in your arm, shoulder, or hand Moderate    Difficulty Sleeping Moderate difficulty    DASH Score 54.55 %             Quick Dash - 05/07/22 0001     Open a tight or new jar Moderate difficulty    Do heavy household chores (wash walls, wash floors) Moderate difficulty    Carry a shopping bag or briefcase Moderate difficulty    Wash your back Severe difficulty    Use a knife to cut food Moderate difficulty    Recreational activities in which you take some force or impact through your arm, shoulder, or hand (golf, hammering, tennis) Severe difficulty    During the past week, to what extent has your arm, shoulder or hand problem interfered with your normal social activities with family, friends, neighbors, or groups? Modererately    During the past week, to what extent has your arm, shoulder or hand problem limited your work or other regular daily activities Modererately    Arm, shoulder, or hand pain. Moderate    Tingling (pins and needles) in your arm, shoulder, or hand Moderate    Difficulty Sleeping Moderate difficulty    DASH Score 54.55 %               OBJECTIVE   COGNITION:            Overall cognitive status: Within functional limits for tasks assessed                POSTURE:  Forward head and rounded shoulders posture  OBSERVATION:  Bilateral  healing mastopexy scars with inferior aspect still open  bilaterally with white exudate. Right side is about 2 inch in diameter that is still open. Educated pt to make sure her doctor is aware as pt reports she still is having significant drainage from this area.    UPPER EXTREMITY AROM/PROM:   A/PROM RIGHT  01/01/2022    Shoulder extension 61  Shoulder flexion 164  Shoulder abduction 173  Shoulder internal rotation 55  Shoulder external rotation 90                          (Blank rows = not tested)   A/PROM LEFT  01/01/2022 L 05/07/22  Shoulder extension 63 59  Shoulder flexion 165 165  Shoulder abduction 180 145  Shoulder internal rotation 68 71  Shoulder external rotation 88 80                          (Blank rows = not tested)       UPPER EXTREMITY STRENGTH: WFL grossly 5/5     LYMPHEDEMA ASSESSMENTS:    LANDMARK RIGHT  01/01/2022  10 cm proximal to olecranon process 36.5  Olecranon process 30.5  10 cm proximal to ulnar styloid process 23.5  Just proximal to ulnar styloid process 18.1  Across hand at thumb web space 20.2  At base of 2nd digit 6.8  (Blank rows = not tested)   LANDMARK LEFT  01/01/2022 L 05/07/22  10 cm proximal to olecranon process 36.5 37  Olecranon process 31.1 31.5  10 cm proximal to ulnar styloid process 24.2 23.5  Just proximal to ulnar styloid process 18.7 19  Across hand at thumb web space 20.8 20.3  At base of 2nd digit 6.9 6.8  (Blank rows = not tested)         Surgery type/Date: 04/13/22 - L lumpectomy and SLNB Number of lymph nodes removed: 0/3 Current/past treatment (chemo, radiation, hormone therapy): completed chemo, will begin radiation on June 28th, will need hormone therapy  Other symptoms:  Heaviness/tightness Yes Pain Yes Pitting edema No Infections No Decreased scar mobility currently still healing, unable to assess Stemmer sign No   PATIENT EDUCATION:  Education details: continue HEP previously reviewed, lying supine and doing snow angel on L, ABC class, obtaining a compression  bra Person educated: Patient Education method: Explanation and Handouts Education comprehension: verbalized understanding   HOME EXERCISE PROGRAM:  Reviewed previously given post op HEP. Snow angel in supine on L  ASSESSMENT:  CLINICAL IMPRESSION: Pt returns to PT after undergoing a L lumpectomy and SLNB (0/3) on 04/13/22. She then had a bilateral mastopexy on 04/21/22. Overall her ROM is good on her R side but her L side is limited in direction of abduction and ER. Pt feels increased tightness in her L axilla and has tingling throughout L UE with overhead motion of L UE. Educated pt in median nerve flossing to help decrease tingling. Pt's wounds are still healing inferiorly from the mastopexy. She would benefit from skilled PT services to improve L shoulder ROM, decrease tightness and discomfort throughout L UE.   Pt will benefit from skilled therapeutic intervention to improve on the following deficits: Decreased knowledge of precautions, impaired UE functional use, pain, decreased ROM, postural dysfunction.   PT treatment/interventions: ADL/Self care home management, Therapeutic exercises, Therapeutic activity, Patient/Family education, Joint mobilization, Orthotic/Fit training, Manual lymph drainage, Compression bandaging, scar mobilization, and Manual therapy  GOALS: Goals reviewed with patient? Yes  LONG TERM GOALS:  (STG=LTG)  GOALS Name Target Date  Goal status  1 Pt will demonstrate she has regained full shoulder ROM and function post operatively compared to baselines.  Baseline: 06/04/2022 INITIAL  2 Pt will demonstrate 170 degrees of left shoulder abduction to allow her to reach out to the side and return to PLOF.  06/04/2022 INITIAL  3 Pt will be independent in a home exercise program for ROM and strengthening including Strength ABC program  06/04/2022 INITIAL  4 Pt will report a 75% improvement in feeling of tightness in L axilla with less instances of tingling in LUE to  allow improved comfort. 06/04/2022 INITIAL     PLAN: PT FREQUENCY/DURATION: 2x/wk for 4 weeks  PLAN FOR NEXT SESSION: PROM to L shoulder, eventually supine scap and Strength ABC program, did pt get compression bra?, did she tell doctor about wound healing   [facility]  [address]  [place] [address]  [phone]  After Breast Cancer Class It is recommended you attend the ABC class to be educated on lymphedema risk reduction. This class is free of charge and lasts for 1 hour. It is a 1-time class. You will need to download the Webex app either on your phone or computer. We will send you a link the night before or the morning of the class. You should be able to click on that link to join the class. This is not a confidential class. You don't have to turn your camera on, but other participants may be able to see your email address.  Scar massage You can begin gentle scar massage to you incision sites. Gently place one hand on the incision and move the skin (without sliding on the skin) in various directions. Do this for a few minutes and then you can gently massage either coconut oil or vitamin E cream into the scars.  Compression garment You should continue wearing your compression bra until you feel like you no longer have swelling.  Home exercise Program Continue doing the exercises you were given until you feel like you can do them without feeling any tightness at the end.   Walking Program Studies show that 30 minutes of walking per day (fast enough to elevate your heart rate) can significantly reduce the risk of a cancer recurrence. If you can't walk due to other medical reasons, we encourage you to find another activity you could do (like a stationary bike or water exercise).  Posture After breast cancer surgery, people frequently sit with rounded shoulders posture because it puts their incisions on slack and feels better. If you sit like this  and scar tissue forms in that position, you can become very tight and have pain sitting or standing with good posture. Try to be aware of your posture and sit and stand up tall to heal properly.  Follow up PT: It is recommended you return every 3 months for the first 3 years following surgery to be assessed on the SOZO machine for an L-Dex score. This helps prevent clinically significant lymphedema in 95% of patients. These follow up screens are 10 minute appointments that you are not billed for.  [facility], PT 05/07/2022, 3:54 PM

## 2022-05-11 ENCOUNTER — Ambulatory Visit: Payer: 59

## 2022-05-11 DIAGNOSIS — Z171 Estrogen receptor negative status [ER-]: Secondary | ICD-10-CM

## 2022-05-11 DIAGNOSIS — M25612 Stiffness of left shoulder, not elsewhere classified: Secondary | ICD-10-CM

## 2022-05-11 DIAGNOSIS — M6281 Muscle weakness (generalized): Secondary | ICD-10-CM

## 2022-05-11 DIAGNOSIS — C50212 Malignant neoplasm of upper-inner quadrant of left female breast: Secondary | ICD-10-CM

## 2022-05-11 DIAGNOSIS — R293 Abnormal posture: Secondary | ICD-10-CM

## 2022-05-11 DIAGNOSIS — Z483 Aftercare following surgery for neoplasm: Secondary | ICD-10-CM

## 2022-05-11 NOTE — Therapy (Signed)
OUTPATIENT PHYSICAL THERAPY BREAST CANCER TREATMENT NOTE   Patient Name: Maria Diaz MRN: [id number] DOB:1971/04/11, 51 y.o., female Today's Date: 05/11/2022   PT End of Session - 05/11/22 1510     Visit Number 3    Number of Visits 10    Date for PT Re-Evaluation 06/04/22    PT Start Time 1506    PT Stop Time 1600    PT Time Calculation (min) 54 min    Activity Tolerance Patient tolerated treatment well    Behavior During Therapy WFL for tasks assessed/performed             Past Medical History:  Diagnosis Date   Anemia    Anxiety    Depression    Glaucoma    Hives    chronic, on Xolair injections   Past Surgical History:  Procedure Laterality Date   BREAST BIOPSY Left 01/19/2022   BREAST CYST EXCISION Left 01/28/2021   Procedure: LEFT BREAST MASS EXCISION;  Surgeon: Rolm Bookbinder, MD;  Location: [place];  Service: General;  Laterality: Left;  START TIME OF 3:00 PM FOR 60 MINUTES IN ROOM 8   BREAST LUMPECTOMY WITH RADIOACTIVE SEED AND SENTINEL LYMPH NODE BIOPSY Left 04/13/2022   Procedure: LEFT BREAST BRACKETED LUMPECTOMY WITH RADIOACTIVE SEED AND AXILLARY SENTINEL LYMPH NODE BIOPSY;  Surgeon: Rolm Bookbinder, MD;  Location: [place];  Service: General;  Laterality: Left;   HYSTEROSCOPY W/ ENDOMETRIAL ABLATION  08/14/2020   [facility]   MASTOPEXY Bilateral 04/21/2022   Procedure: MASTOPEXY;  Surgeon: Irene Limbo, MD;  Location: [place];  Service: Plastics;  Laterality: Bilateral;   Patient Active Problem List   Diagnosis Date Noted   Anemia 12/31/2021   Malignant neoplasm of upper-inner quadrant of left breast in female, estrogen receptor positive ([place]) 12/31/2021   Open angle with borderline findings and low glaucoma risk in both eyes 01/11/2016   Chronic idiopathic urticaria 01/11/2016   Age-related nuclear cataract of both eyes 01/11/2016   Eczema 01/11/2016    PCP: [name], MD    REFERRING PROVIDER: Rolm Bookbinder, MD   REFERRING DIAG: [phone] (ICD-10-CM) - Malignant neoplasm of upper-inner quadrant of left female breast   THERAPY DIAG:  Stiffness of left shoulder, not elsewhere classified  Muscle weakness (generalized)  Aftercare following surgery for neoplasm  Abnormal posture  Malignant neoplasm of upper-inner quadrant of left breast in female, estrogen receptor negative ([place])  Rationale for Evaluation and Treatment Rehabilitation  ONSET DATE: 12/09/21  SUBJECTIVE:  SUBJECTIVE STATEMENT: I called the doctor about my incisions still being open and they are having me come by tomorrow to teach me how to care for them. The more open one under my Rt breast really bothers me the most.   PERTINENT HISTORY:  Patient was diagnosed on 12/09/21 with left grade 3. It measures 3 cm and is located in the upper inner quadrant. 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 04/13/22- underwent L breast lumpectomy and SLNB (0/3) 04/21/22- bilateral mastopexy    PATIENT GOALS:  Reassess how my recovery is going related to arm function, pain, and swelling.  PAIN:  Are you having pain? Yes: NPRS scale: Rt 7 ,Lt 4-5/10 Pain location: bil inferior breast at open incisions  Pain description: feels like an open cut on the Rt  Aggravating factors: wearing a bra, pressure on the area Relieving factors: nothing  PRECAUTIONS: Recent Surgery, left UE Lymphedema risk,   ACTIVITY LEVEL / LEISURE: been trying to do ROM exercises, pt reports she has been walking a half a mile several times recently   OBJECTIVE:   PATIENT SURVEYS:  QUICK DASH:        OBJECTIVE   COGNITION:            Overall cognitive status: Within functional limits for tasks assessed                 POSTURE:  Forward head and rounded shoulders posture  OBSERVATION:  Bilateral healing mastopexy scars with inferior aspect still open bilaterally with white exudate. Right side is about 2 inch in diameter that is still open. Educated pt to make sure her doctor is aware as pt reports she still is having significant drainage from this area.    UPPER EXTREMITY AROM/PROM:   A/PROM RIGHT  01/01/2022    Shoulder extension 61  Shoulder flexion 164  Shoulder abduction 173  Shoulder internal rotation 55  Shoulder external rotation 90                          (Blank rows = not tested)   A/PROM LEFT  01/01/2022 L 05/07/22  Shoulder extension 63 59  Shoulder flexion 165 165  Shoulder abduction 180 145  Shoulder internal rotation 68 71  Shoulder external rotation 88 80                          (Blank rows = not tested)       UPPER EXTREMITY STRENGTH: WFL grossly 5/5     LYMPHEDEMA ASSESSMENTS:    LANDMARK RIGHT  01/01/2022  10 cm proximal to olecranon process 36.5  Olecranon process 30.5  10 cm proximal to ulnar styloid process 23.5  Just proximal to ulnar styloid process 18.1  Across hand at thumb web space 20.2  At base of 2nd digit 6.8  (Blank rows = not tested)   LANDMARK LEFT  01/01/2022 L 05/07/22  10 cm proximal to olecranon process 36.5 37  Olecranon process 31.1 31.5  10 cm proximal to ulnar styloid process 24.2 23.5  Just proximal to ulnar styloid process 18.7 19  Across hand at thumb web space 20.8 20.3  At base of 2nd digit 6.9 6.8  (Blank rows = not tested)         Surgery type/Date: 04/13/22 - L lumpectomy and SLNB Number of lymph nodes removed: 0/3 Current/past treatment (chemo, radiation, hormone therapy): completed  chemo, will begin radiation on June 28th, will need hormone therapy  Other symptoms:  Heaviness/tightness Yes Pain Yes Pitting edema No Infections No Decreased scar mobility currently still healing, unable to assess Stemmer sign No  TODAY'S  TREATMENT: 05/11/22: Manual Therapy MFR to Lt axilla during P/ROM P/ROM of Lt shoulder in supine into flexion, abd, D2, and er/IR to pts tolerance. She reports increased tingling at end abd but this lessened some towards end of session. Scapular Mobs in Rt S/L to Lt scapula into protraction and retraction  STM along medial border of Lt scapula where palpable tightness present See below in assessment for bandages placed at inferior breasts   PATIENT EDUCATION:  Education details: continue HEP previously reviewed, lying supine and doing snow angel on L, ABC class, obtaining a compression bra Person educated: Patient Education method: Explanation and Handouts Education comprehension: verbalized understanding   HOME EXERCISE PROGRAM:  Reviewed previously given post op HEP. Snow angel in supine on L  ASSESSMENT:  CLINICAL IMPRESSION: Pt comes in reporting increased pain at her Rt inferior breast where incision is very open and tissue is exposed with drainage present. Placed a 4' x 4' Allevyn bandage here to prevent further rubbing from her bra and no treatment was done to Rt UE. Focused on P/ROM to Lt shoulder and Lt scapular mobs. Pts P/ROM was some improved by end of session and she reports her shoulder feeling much looser. ABD pad was cut in half and placed at the inferior aspect of her Lt breast at her bra to prevent further irritation here though this area is healing better than the Rt side.   Pt will benefit from skilled therapeutic intervention to improve on the following deficits: Decreased knowledge of precautions, impaired UE functional use, pain, decreased ROM, postural dysfunction.   PT treatment/interventions: ADL/Self care home management, Therapeutic exercises, Therapeutic activity, Patient/Family education, Joint mobilization, Orthotic/Fit training, Manual lymph drainage, Compression bandaging, scar mobilization, and Manual therapy     GOALS: Goals reviewed with patient?  Yes  LONG TERM GOALS:  (STG=LTG)  GOALS Name Target Date  Goal status  1 Pt will demonstrate she has regained full shoulder ROM and function post operatively compared to baselines.  Baseline: 06/04/2022 INITIAL  2 Pt will demonstrate 170 degrees of left shoulder abduction to allow her to reach out to the side and return to PLOF.  06/04/2022 INITIAL  3 Pt will be independent in a home exercise program for ROM and strengthening including Strength ABC program  06/04/2022 INITIAL  4 Pt will report a 75% improvement in feeling of tightness in L axilla with less instances of tingling in LUE to allow improved comfort. 06/04/2022 INITIAL     PLAN: PT FREQUENCY/DURATION: 2x/wk for 4 weeks  PLAN FOR NEXT SESSION: What did doctor say about inferior breast? PROM to L shoulder, eventually supine scap and Strength ABC program, did pt get compression bra?, did she tell doctor about wound healing   [facility]  [address]  [place] [address]  [phone]  After Breast Cancer Class It is recommended you attend the ABC class to be educated on lymphedema risk reduction. This class is free of charge and lasts for 1 hour. It is a 1-time class. You will need to download the Webex app either on your phone or computer. We will send you a link the night before or the morning of the class. You should be able to click on that link to join  the class. This is not a confidential class. You don't have to turn your camera on, but other participants may be able to see your email address.  Scar massage You can begin gentle scar massage to you incision sites. Gently place one hand on the incision and move the skin (without sliding on the skin) in various directions. Do this for a few minutes and then you can gently massage either coconut oil or vitamin E cream into the scars.  Compression garment You should continue wearing your compression bra until you feel like you no longer have  swelling.  Home exercise Program Continue doing the exercises you were given until you feel like you can do them without feeling any tightness at the end.   Walking Program Studies show that 30 minutes of walking per day (fast enough to elevate your heart rate) can significantly reduce the risk of a cancer recurrence. If you can't walk due to other medical reasons, we encourage you to find another activity you could do (like a stationary bike or water exercise).  Posture After breast cancer surgery, people frequently sit with rounded shoulders posture because it puts their incisions on slack and feels better. If you sit like this and scar tissue forms in that position, you can become very tight and have pain sitting or standing with good posture. Try to be aware of your posture and sit and stand up tall to heal properly.  Follow up PT: It is recommended you return every 3 months for the first 3 years following surgery to be assessed on the SOZO machine for an L-Dex score. This helps prevent clinically significant lymphedema in 95% of patients. These follow up screens are 10 minute appointments that you are not billed for.  Otelia Limes, PTA 05/11/2022, 5:34 PM

## 2022-05-12 ENCOUNTER — Ambulatory Visit: Payer: 59

## 2022-05-12 DIAGNOSIS — M25612 Stiffness of left shoulder, not elsewhere classified: Secondary | ICD-10-CM | POA: Diagnosis not present

## 2022-05-12 DIAGNOSIS — M6281 Muscle weakness (generalized): Secondary | ICD-10-CM

## 2022-05-12 DIAGNOSIS — Z483 Aftercare following surgery for neoplasm: Secondary | ICD-10-CM

## 2022-05-12 DIAGNOSIS — R293 Abnormal posture: Secondary | ICD-10-CM

## 2022-05-12 DIAGNOSIS — Z171 Estrogen receptor negative status [ER-]: Secondary | ICD-10-CM

## 2022-05-12 NOTE — Therapy (Signed)
OUTPATIENT PHYSICAL THERAPY BREAST CANCER TREATMENT NOTE   Patient Name: Maria Diaz MRN: [id number] DOB:[birth year], 51 y.o., female Today's Date: 05/12/2022   PT End of Session - 05/12/22 1006     Visit Number 4    Number of Visits 10    Date for PT Re-Evaluation 06/04/22    PT Start Time 1000    PT Stop Time 1055    PT Time Calculation (min) 55 min    Activity Tolerance Patient tolerated treatment well    Behavior During Therapy WFL for tasks assessed/performed             Past Medical History:  Diagnosis Date   Anemia    Anxiety    Depression    Glaucoma    Hives    chronic, on Xolair injections   Past Surgical History:  Procedure Laterality Date   BREAST BIOPSY Left 01/19/2022   BREAST CYST EXCISION Left 01/28/2021   Procedure: LEFT BREAST MASS EXCISION;  Surgeon: Rolm Bookbinder, MD;  Location: [facility];  Service: General;  Laterality: Left;  START TIME OF 3:00 PM FOR 60 MINUTES IN ROOM 8   BREAST LUMPECTOMY WITH RADIOACTIVE SEED AND SENTINEL LYMPH NODE BIOPSY Left 04/13/2022   Procedure: LEFT BREAST BRACKETED LUMPECTOMY WITH RADIOACTIVE SEED AND AXILLARY SENTINEL LYMPH NODE BIOPSY;  Surgeon: Rolm Bookbinder, MD;  Location: [facility];  Service: General;  Laterality: Left;   HYSTEROSCOPY W/ ENDOMETRIAL ABLATION  08/14/2020   [facility]   MASTOPEXY Bilateral 04/21/2022   Procedure: MASTOPEXY;  Surgeon: Irene Limbo, MD;  Location: [facility];  Service: Plastics;  Laterality: Bilateral;   Patient Active Problem List   Diagnosis Date Noted   Anemia 12/31/2021   Malignant neoplasm of upper-inner quadrant of left breast in female, estrogen receptor positive ([place]) 12/31/2021   Open angle with borderline findings and low glaucoma risk in both eyes 01/11/2016   Chronic idiopathic urticaria 01/11/2016   Age-related nuclear cataract of both eyes 01/11/2016   Eczema 01/11/2016    PCP: [name], MD    REFERRING PROVIDER: Rolm Bookbinder, MD   REFERRING DIAG: [phone] (ICD-10-CM) - Malignant neoplasm of upper-inner quadrant of left female breast   THERAPY DIAG:  Stiffness of left shoulder, not elsewhere classified  Muscle weakness (generalized)  Aftercare following surgery for neoplasm  Abnormal posture  Malignant neoplasm of upper-inner quadrant of left breast in female, estrogen receptor negative ([place])  Rationale for Evaluation and Treatment Rehabilitation  ONSET DATE: 12/09/21  SUBJECTIVE:  SUBJECTIVE STATEMENT: I see the doctor after I leave here about my incisions. The bandage you gave me last night really helped alleviate the pain I've been feeling at hte Rt breast. It isn't hurting at all right now.   PERTINENT HISTORY:  Patient was diagnosed on 12/09/21 with left grade 3. It measures 3 cm and is located in the upper inner quadrant. 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 04/13/22- underwent L breast lumpectomy and SLNB (0/3) 04/21/22- bilateral mastopexy    PATIENT GOALS:  Reassess how my recovery is going related to arm function, pain, and swelling.  PAIN:  Are you having pain? Nom not currently  PRECAUTIONS: Recent Surgery, left UE Lymphedema risk,   ACTIVITY LEVEL / LEISURE: been trying to do ROM exercises, pt reports she has been walking a half a mile several times recently   OBJECTIVE:   PATIENT SURVEYS:  QUICK DASH:        OBJECTIVE   COGNITION:            Overall cognitive status: Within functional limits for tasks assessed                POSTURE:  Forward head and rounded shoulders posture  OBSERVATION:  Bilateral healing mastopexy scars with inferior aspect still open bilaterally with white exudate. Right side is about 2 inch in  diameter that is still open. Educated pt to make sure her doctor is aware as pt reports she still is having significant drainage from this area.    UPPER EXTREMITY AROM/PROM:   A/PROM RIGHT  01/01/2022    Shoulder extension 61  Shoulder flexion 164  Shoulder abduction 173  Shoulder internal rotation 55  Shoulder external rotation 90                          (Blank rows = not tested)   A/PROM LEFT  01/01/2022 L 05/07/22  Shoulder extension 63 59  Shoulder flexion 165 165  Shoulder abduction 180 145  Shoulder internal rotation 68 71  Shoulder external rotation 88 80                          (Blank rows = not tested)       UPPER EXTREMITY STRENGTH: WFL grossly 5/5     LYMPHEDEMA ASSESSMENTS:    LANDMARK RIGHT  01/01/2022  10 cm proximal to olecranon process 36.5  Olecranon process 30.5  10 cm proximal to ulnar styloid process 23.5  Just proximal to ulnar styloid process 18.1  Across hand at thumb web space 20.2  At base of 2nd digit 6.8  (Blank rows = not tested)   LANDMARK LEFT  01/01/2022 L 05/07/22  10 cm proximal to olecranon process 36.5 37  Olecranon process 31.1 31.5  10 cm proximal to ulnar styloid process 24.2 23.5  Just proximal to ulnar styloid process 18.7 19  Across hand at thumb web space 20.8 20.3  At base of 2nd digit 6.9 6.8  (Blank rows = not tested)         Surgery type/Date: 04/13/22 - L lumpectomy and SLNB Number of lymph nodes removed: 0/3 Current/past treatment (chemo, radiation, hormone therapy): completed chemo, will begin radiation on June 28th, will need hormone therapy  Other symptoms:  Heaviness/tightness Yes Pain Yes Pitting edema No Infections No Decreased scar mobility currently still healing, unable to assess Stemmer sign No  TODAY'S  TREATMENT: 05/12/22: Manual Therapy MFR to Lt axilla during P/ROM P/ROM of Lt shoulder in supine into flexion, abd, D2, and er/IR to pts tolerance. She reports increased tingling at end abd but this  continued to lessen some towards end of session. Scapular Mobs in Rt S/L to Lt scapula into protraction and retraction , scapular mobility some improved today from yesterday STM with cocoa butter along medial border of Lt scapula and Lt upper trap where palpable tightness present Therapeutic Exs Pulleys into flexion and scaption x2 mins each returning therapist demo for each; some tingling reported at end of scaption  Ball roll up wall into flexion x10 with forward lean into end of stretch returning therapist demo  05/11/22: Manual Therapy MFR to Lt axilla during P/ROM P/ROM of Lt shoulder in supine into flexion, abd, D2, and er/IR to pts tolerance. She reports increased tingling at end abd but this lessened some towards end of session. Scapular Mobs in Rt S/L to Lt scapula into protraction and retraction  STM along medial border of Lt scapula where palpable tightness present See below in assessment for bandages placed at inferior breasts   PATIENT EDUCATION:  Education details: continue HEP previously reviewed, lying supine and doing snow angel on L, ABC class, obtaining a compression bra Person educated: Patient Education method: Explanation and Handouts Education comprehension: verbalized understanding   HOME EXERCISE PROGRAM:  Reviewed previously given post op HEP. Snow angel in supine on L  ASSESSMENT:  CLINICAL IMPRESSION: Pt returns after having treatment yesterday reporting she felt good after and no increased in tingling into Lt UE/fingers. She sees her doctor about slow healing incision, Rt>Lt, at inferior breast and how to treat this to support better healing tissue environment. Continued with manual therapy working to decrease Lt upper quadrant tightness which effectively improved her end Lt shoulder motion by end of session. Also included adding AA/ROM with pulleys and ball roll up wall focusing on instruction of decreasing scapular compensation which pt was able to do well  with VCs.   Pt will benefit from skilled therapeutic intervention to improve on the following deficits: Decreased knowledge of precautions, impaired UE functional use, pain, decreased ROM, postural dysfunction.   PT treatment/interventions: ADL/Self care home management, Therapeutic exercises, Therapeutic activity, Patient/Family education, Joint mobilization, Orthotic/Fit training, Manual lymph drainage, Compression bandaging, scar mobilization, and Manual therapy     GOALS: Goals reviewed with patient? Yes  LONG TERM GOALS:  (STG=LTG)  GOALS Name Target Date  Goal status  1 Pt will demonstrate she has regained full shoulder ROM and function post operatively compared to baselines.  Baseline: 06/04/2022 INITIAL  2 Pt will demonstrate 170 degrees of left shoulder abduction to allow her to reach out to the side and return to PLOF.  06/04/2022 INITIAL  3 Pt will be independent in a home exercise program for ROM and strengthening including Strength ABC program  06/04/2022 INITIAL  4 Pt will report a 75% improvement in feeling of tightness in L axilla with less instances of tingling in LUE to allow improved comfort. 06/04/2022 INITIAL     PLAN: PT FREQUENCY/DURATION: 2x/wk for 4 weeks  PLAN FOR NEXT SESSION: What did doctor say about inferior breast? PROM to L shoulder, eventually supine scap and Strength ABC program, did pt get compression bra?   Otelia Limes, PTA 05/12/2022, 10:58 AM

## 2022-05-13 ENCOUNTER — Other Ambulatory Visit: Payer: Self-pay | Admitting: Psychiatry

## 2022-05-13 NOTE — Progress Notes (Signed)
Location of Breast Cancer: upper-inner quadrant of left breast    Histology per Pathology Report:  Breast, left, needle core biopsy, upper central, barbell clip - INVASIVE DUCTAL CARCINOMA, GRADE 3.   Receptor Status: ER 40% weak, PR negative and HER2 negative KI of 30%.   Did patient present with symptoms (if so, please note symptoms) or was this found on screening mammography?: palpable lump   Past/Anticipated interventions by surgeon, if any:  Date of Service: 01/28/2021 Procedure: Right breast cyst excisional biopsy Surgeon: Dr. Matt Wakefield   04/13/2022 --Dr. Matthew Wakefield LEFT BREAST BRACKETED LUMPECTOMY WITH RADIOACTIVE SEED AND AXILLARY SENTINEL LYMPH NODE BIOPSY    04/21/2022 --Dr. Brinda Thimmappa MASTOPEXY (Bilateral)   Past/Anticipated interventions by medical oncology, if any:  Taxotere/Cytoxan 01/08/2022 - 03/12/2022. Antiestrogen therapy to follow radiation.  Lymphedema issues, if any:  None     Pain issues, if any:    SAFETY ISSUES: Prior radiation? No Pacemaker/ICD? No Possible current pregnancy? No--perimenopausal (taking Provera 10 mg/daily) Is the patient on methotrexate? No   Current Complaints / other details:  None noted   BP 102/66 (BP Location: Right Arm, Patient Position: Sitting)   Pulse 76   Temp 98.4 F (36.9 C) (Oral)   Resp 18   Ht 5' 7" (1.702 m)   Wt 217 lb 6.4 oz (98.6 kg)   SpO2 98%   BMI 34.05 kg/m    

## 2022-05-14 ENCOUNTER — Ambulatory Visit (INDEPENDENT_AMBULATORY_CARE_PROVIDER_SITE_OTHER): Payer: 59 | Admitting: Psychiatry

## 2022-05-14 DIAGNOSIS — F3342 Major depressive disorder, recurrent, in full remission: Secondary | ICD-10-CM

## 2022-05-14 DIAGNOSIS — F4310 Post-traumatic stress disorder, unspecified: Secondary | ICD-10-CM
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
## 2022-05-18 ENCOUNTER — Ambulatory Visit: Payer: 59

## 2022-05-18 ENCOUNTER — Ambulatory Visit: Payer: Self-pay | Admitting: Physical Therapy

## 2022-05-18 DIAGNOSIS — M6281 Muscle weakness (generalized): Secondary | ICD-10-CM

## 2022-05-18 DIAGNOSIS — M25612 Stiffness of left shoulder, not elsewhere classified: Secondary | ICD-10-CM

## 2022-05-18 DIAGNOSIS — Z483 Aftercare following surgery for neoplasm: Secondary | ICD-10-CM

## 2022-05-18 DIAGNOSIS — C50212 Malignant neoplasm of upper-inner quadrant of left female breast: Secondary | ICD-10-CM

## 2022-05-18 DIAGNOSIS — R293 Abnormal posture: Secondary | ICD-10-CM

## 2022-05-19 NOTE — Progress Notes (Signed)
Radiation Oncology         [phone] ________________________________  Name: Maria Diaz MRN: [id number]  Date: 05/20/2022  DOB: 1971/10/19  Re-Evaluation Note  CC: [name], MD  Benay Pike, MD    ICD-10-CM   1. Malignant neoplasm of upper-inner quadrant of left breast in female, estrogen receptor positive ([place])  C50.212    Z17.0       Diagnosis:  S/p chemotherapy and lumpectomy: No residual carcinoma   Stage IIB (cT2, cN0, cM0) Left Breast UIQ, Invasive Ductal Carcinoma, ER+ / PR- / Her2-, Grade 3, s/p neoadjuvant chemotherapy    Cancer Staging  Malignant neoplasm of upper-inner quadrant of left breast in female, estrogen receptor positive ([place]) Staging form: Breast, AJCC 8th Edition - Clinical: Stage IIB (cT2, cN0, cM0, G3, ER+, PR-, HER2-) - Unsigned  Narrative:  The patient returns today to discuss radiation treatment options. She was seen in consultation on 04/01/22.   To review, the patient has completed neoadjuvant chemotherapy consisting of TC from 01/08/22 through 03/12/22 under the care of Dr. Chryl Heck. She had a complete pathologic response to chemotherapy as noted below.   She opted to proceed with left breast bracketed lumpectomy with SLN biopsies on 04/13/22 under the care of Dr. Donne Hazel. Pathology from the procedure revealed: no residual carcinoma status post neoadjuvant therapy. Nodal status of 3/3 left axillary SLN excisions negative for carcinoma.    The patient then underwent bilateral mastopexies on 04/21/22. Pathology from right breast mastopexy revealed no evidence of carcinoma, with atypical lobular hyperplasia with a background of stromal fibrosis and adenosis. Left mastopexy revealed no evidence of carcinoma, with benign breast tissue with stroma fibrosis, cystic dilatation of ducts, adenosis, and early fibromatoid changes.   During a follow up visit with Dr. Chryl Heck on 04/23/22, the patient reported some pain in the surgical regions and  indwelling drains. She otherwise denied any other complaints. Following completion of XRT, the patient will return to Dr. Chryl Heck to discuss antiestrogen treatment options.   Per her most recent follow up with Dr. Donne Hazel on 04/29/22, the patient showed no signs of infection or seroma to the left breast on examination, and denied any complaints.   On review of systems, the patient reports some discomfort in the breast but no pain at this time.. She denies swelling in her left arm or hand.  She denies any drainage from her surgical scars along the left side.   Allergies:  is allergic to haemophilus influenzae vaccines and penicillins.  Meds: Current Outpatient Medications  Medication Sig Dispense Refill   EPINEPHrine 0.3 mg/0.3 mL IJ SOAJ injection Inject 0.3 mg into the muscle as needed. For severe allergy     fluticasone (FLONASE) 50 MCG/ACT nasal spray Place 2 sprays into the nose daily as needed.     hydrOXYzine (ATARAX) 25 MG tablet Take 1 tablet (25 mg total) by mouth daily as needed for anxiety. 90 tablet 0   medroxyPROGESTERone (PROVERA) 10 MG tablet Take 10 mg by mouth daily.     omalizumab (XOLAIR) 150 MG injection Inject 300 mg into the skin every 6 (six) weeks. Dues sept 2     oxyCODONE (ROXICODONE) 5 MG immediate release tablet Take 1 tablet (5 mg total) by mouth every 4 (four) hours as needed. 15 tablet 0   prazosin (MINIPRESS) 2 MG capsule Take 1 capsule (2 mg total) by mouth at bedtime. 90 capsule 1   sertraline (ZOLOFT) 100 MG tablet Take 2 tablets (200 mg total)  by mouth daily. 180 tablet 1   traZODone (DESYREL) 50 MG tablet Take 0.5-1 tablets (25-50 mg total) by mouth at bedtime as needed for sleep. 30 tablet 1   No current facility-administered medications for this encounter.    Physical Findings: The patient is in no acute distress. Patient is alert and oriented.  height is 5' 7"  (1.702 m) and weight is 217 lb 6.4 oz (98.6 kg). Her oral temperature is 98.4 F (36.9 C).  Her blood pressure is 102/66 and her pulse is 76. Her respiration is 18 and oxygen saturation is 98%.  No significant changes. Lungs are clear to auscultation bilaterally. Heart has regular rate and rhythm. No palpable cervical, supraclavicular, or axillary adenopathy. Abdomen soft, non-tender, normal bowel sounds. Right Breast: no palpable mass, nipple discharge or bleeding.  Reconstruction scars noted.  Small area of along the inferior aspect the breast require some additional healing. Left Breast: Reconstruction scars noted.  No signs of drainage or infection.  Mild erythema to the central breast region consistent with surgical effect.  Lab Findings: Lab Results  Component Value Date   WBC 12.1 (H) 03/12/2022   HGB 12.7 03/12/2022   HCT 37.8 03/12/2022   MCV 91.5 03/12/2022   PLT 314 03/12/2022    Radiographic Findings: No results found.  Impression:  S/p chemotherapy and lumpectomy: No residual carcinoma   Stage IIB (cT2, cN0, cM0) Left Breast UIQ, Invasive Ductal Carcinoma, ER+ / PR- / Her2-, Grade 3, s/p neoadjuvant chemotherapy   She has had an excellent response to her neoadjuvant chemotherapy with complete pathologic response.  She is now ready to proceed with radiation therapy as breast conserving treatment.  I discussed the general course of radiation anticipated side effects and potential long-term toxicities of this treatment in detail.  The patient appears to understand and wished to proceed with planned course of treatment.  Plan:  Patient is scheduled for CT simulation later today.  Anticipate 6-1/2 weeks of radiation therapy.  We will attempt to use cardiac sparing techniques if necessary.  If lumpectomy cavity cannot be identified given her additional reconstructive surgery then she will only receive 5-1/2 weeks of radiation therapy.  Would not recommend hypofractionated accelerated radiation therapy given the extensive surgery after her  lumpectomy.  -----------------------------------  Blair Promise, PhD, MD  This document serves as a record of services personally performed by Gery Pray, MD. It was created on his behalf by Roney Mans, a trained medical scribe. The creation of this record is based on the scribe's personal observations and the provider's statements to them. This document has been checked and approved by the attending provider.

## 2022-05-20 ENCOUNTER — Ambulatory Visit
Admission: RE | Admit: 2022-05-20 | Discharge: 2022-05-20 | Disposition: A | Discharge status: Home / Self Care | Payer: 59 | Source: Ambulatory Visit | Attending: Radiation Oncology | Admitting: Radiation Oncology

## 2022-05-20 ENCOUNTER — Other Ambulatory Visit: Payer: Self-pay

## 2022-05-20 ENCOUNTER — Encounter: Payer: Self-pay | Admitting: Radiation Oncology

## 2022-05-20 DIAGNOSIS — Z79899 Other long term (current) drug therapy: Secondary | ICD-10-CM | POA: Insufficient documentation

## 2022-05-20 DIAGNOSIS — C50212 Malignant neoplasm of upper-inner quadrant of left female breast: Secondary | ICD-10-CM

## 2022-05-20 DIAGNOSIS — Z793 Long term (current) use of hormonal contraceptives: Secondary | ICD-10-CM | POA: Insufficient documentation

## 2022-05-20 DIAGNOSIS — Z170 Estrogen receptor positive status [ER+]: Secondary | ICD-10-CM | POA: Insufficient documentation

## 2022-05-21 ENCOUNTER — Encounter: Payer: Self-pay | Admitting: Rehabilitation

## 2022-05-21 ENCOUNTER — Ambulatory Visit: Payer: 59 | Admitting: Rehabilitation

## 2022-05-21 DIAGNOSIS — M25612 Stiffness of left shoulder, not elsewhere classified: Secondary | ICD-10-CM

## 2022-05-21 DIAGNOSIS — Z483 Aftercare following surgery for neoplasm: Secondary | ICD-10-CM

## 2022-05-21 DIAGNOSIS — M6281 Muscle weakness (generalized): Secondary | ICD-10-CM

## 2022-05-21 DIAGNOSIS — C50212 Malignant neoplasm of upper-inner quadrant of left female breast: Secondary | ICD-10-CM

## 2022-05-21 DIAGNOSIS — R293 Abnormal posture: Secondary | ICD-10-CM

## 2022-05-21 NOTE — Therapy (Signed)
OUTPATIENT PHYSICAL THERAPY BREAST CANCER TREATMENT NOTE   Patient Name: Maria Diaz MRN: [id number] DOB:06/10/1971, 51 y.o., female Today's Date: 05/21/2022   PT End of Session - 05/21/22 1233     Visit Number 6    Number of Visits 10    Date for PT Re-Evaluation 06/04/22    PT Start Time 0174    PT Stop Time 1319    PT Time Calculation (min) 44 min    Activity Tolerance Patient tolerated treatment well    Behavior During Therapy WFL for tasks assessed/performed             Past Medical History:  Diagnosis Date   Anemia    Anxiety    Depression    Glaucoma    Hives    chronic, on Xolair injections   Past Surgical History:  Procedure Laterality Date   BREAST BIOPSY Left 01/19/2022   BREAST CYST EXCISION Left 01/28/2021   Procedure: LEFT BREAST MASS EXCISION;  Surgeon: Rolm Bookbinder, MD;  Location: [place];  Service: General;  Laterality: Left;  START TIME OF 3:00 PM FOR 60 MINUTES IN ROOM 8   BREAST LUMPECTOMY WITH RADIOACTIVE SEED AND SENTINEL LYMPH NODE BIOPSY Left 04/13/2022   Procedure: LEFT BREAST BRACKETED LUMPECTOMY WITH RADIOACTIVE SEED AND AXILLARY SENTINEL LYMPH NODE BIOPSY;  Surgeon: Rolm Bookbinder, MD;  Location: [place];  Service: General;  Laterality: Left;   HYSTEROSCOPY W/ ENDOMETRIAL ABLATION  08/14/2020   [facility]   MASTOPEXY Bilateral 04/21/2022   Procedure: MASTOPEXY;  Surgeon: Irene Limbo, MD;  Location: [place];  Service: Plastics;  Laterality: Bilateral;   Patient Active Problem List   Diagnosis Date Noted   Anemia 12/31/2021   Malignant neoplasm of upper-inner quadrant of left breast in female, estrogen receptor positive ([place]) 12/31/2021   Open angle with borderline findings and low glaucoma risk in both eyes 01/11/2016   Chronic idiopathic urticaria 01/11/2016   Age-related nuclear cataract of both eyes 01/11/2016   Eczema 01/11/2016    PCP: [name], MD    REFERRING PROVIDER: Rolm Bookbinder, MD   REFERRING DIAG: [phone] (ICD-10-CM) - Malignant neoplasm of upper-inner quadrant of left female breast   THERAPY DIAG:  Stiffness of left shoulder, not elsewhere classified  Muscle weakness (generalized)  Aftercare following surgery for neoplasm  Abnormal posture  Malignant neoplasm of upper-inner quadrant of left breast in female, estrogen receptor negative ([place])  Rationale for Evaluation and Treatment Rehabilitation  ONSET DATE: 12/09/21  SUBJECTIVE:  SUBJECTIVE STATEMENT: I had my simulation yesterday and it was okay.  I don't feel like 100% sometimes I feel like I need more ROM and strength but some days I feel fine   PERTINENT HISTORY:  Patient was diagnosed on 12/09/21 with left grade 3. It measures 3 cm and is located in the upper inner quadrant. 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 04/13/22- underwent L breast lumpectomy and SLNB (0/3) 04/21/22- bilateral mastopexy   PATIENT GOALS:  Reassess how my recovery is going related to arm function, pain, and swelling.  PAIN:  Are you having pain? Not currently  PRECAUTIONS: Recent Surgery, left UE Lymphedema risk,   ACTIVITY LEVEL / LEISURE: been trying to do ROM exercises, pt reports she has been walking a half a mile several times recently   OBJECTIVE  COGNITION:            Overall cognitive status: Within functional limits for tasks assessed                POSTURE:  Forward head and rounded shoulders posture  OBSERVATION:  Bilateral healing mastopexy scars with inferior aspect still open bilaterally with white exudate. Right side is about 2 inch in diameter that is still open. Educated pt to make sure her doctor is aware as pt reports she still is having significant  drainage from this area.    UPPER EXTREMITY AROM/PROM:   A/PROM RIGHT  01/01/2022    Shoulder extension 61  Shoulder flexion 164  Shoulder abduction 173  Shoulder internal rotation 55  Shoulder external rotation 90                          (Blank rows = not tested)   A/PROM LEFT  01/01/2022 L 05/07/22 05/21/22  Shoulder extension 63 59 60  Shoulder flexion 165 165 165  Shoulder abduction 180 145 165  Shoulder internal rotation 68 71   Shoulder external rotation 88 80 88                          (Blank rows = not tested)     LYMPHEDEMA ASSESSMENTS:    LANDMARK RIGHT  01/01/2022  10 cm proximal to olecranon process 36.5  Olecranon process 30.5  10 cm proximal to ulnar styloid process 23.5  Just proximal to ulnar styloid process 18.1  Across hand at thumb web space 20.2  At base of 2nd digit 6.8  (Blank rows = not tested)   LANDMARK LEFT  01/01/2022 L 05/07/22  10 cm proximal to olecranon process 36.5 37  Olecranon process 31.1 31.5  10 cm proximal to ulnar styloid process 24.2 23.5  Just proximal to ulnar styloid process 18.7 19  Across hand at thumb web space 20.8 20.3  At base of 2nd digit 6.9 6.8  (Blank rows = not tested)     TODAY'S TREATMENT: 05/21/22: Manual Therapy: PROM to the Rt shoulder into flexion, abduction focus  GH AP and inferior glides grade IV- 3x30" each Rt sidelying scapular mobilizations Therapeutic Exercise:  Extension presses into table with palm facing up 5" x 5 Pro/ret bil x 10 with initial tcs and vcs for completion ER/IR alternating isometrics 5" x 5 each x 2 Sidelying ER AROM x 10 Sidelying abduction to around 60deg before tingling starts and then posterior circles 2x5 with retraction focus  05/18/22: Manual Therapy MFR to Lt axilla during P/ROM  P/ROM of Lt shoulder in supine into flexion, abd, D2, and er/IR to pts tolerance. She reports increased tingling at end abd but this continued to lessen some towards end of session. Scapular Mobs in Rt  S/L to Lt scapula into protraction and retraction , scapular mobility some improved but still limited overall STM with cocoa butter along medial border of Lt scapula and Lt upper trap where palpable tightness present  05/12/22: Manual Therapy MFR to Lt axilla during P/ROM P/ROM of Lt shoulder in supine into flexion, abd, D2, and er/IR to pts tolerance. She reports increased tingling at end abd but this continued to lessen some towards end of session. Scapular Mobs in Rt S/L to Lt scapula into protraction and retraction , scapular mobility some improved but still limited overall STM with cocoa butter along medial border of Lt scapula and Lt upper trap where palpable tightness present Therapeutic Exs Pulleys into flexion and scaption x2 mins each returning therapist demo for each; some tingling reported at end of scaption  Ball roll up wall into flexion x10 with forward lean into end of stretch returning therapist demo   PATIENT EDUCATION:  Education details: continue HEP previously reviewed, lying supine and doing snow angel on L, ABC class, obtaining a compression bra Person educated: Patient Education method: Explanation and Handouts Education comprehension: verbalized understanding   HOME EXERCISE PROGRAM:  Reviewed previously given post op HEP. Snow angel in supine on L  ASSESSMENT:  CLINICAL IMPRESSION: Pt returns after having been seen at Dr. Link Snuffer office who stated the wound healing was going well and she is able to start radiation.  Continued today with manual therapy focusing on improving her end P/ROM of Lt shoulder. Reassessed AROM which is improving into abduction put still not baseline due to pain in the outer arm.  Added some more impingement TOS stabilization exercises today. Still avoided much heavy exercise due to wounds still appearing fragile.   Pt will benefit from skilled therapeutic intervention to improve on the following deficits: Decreased knowledge of  precautions, impaired UE functional use, pain, decreased ROM, postural dysfunction.   PT treatment/interventions: ADL/Self care home management, Therapeutic exercises, Therapeutic activity, Patient/Family education, Joint mobilization, Orthotic/Fit training, Manual lymph drainage, Compression bandaging, scar mobilization, and Manual therapy     GOALS: Goals reviewed with patient? Yes  LONG TERM GOALS:  (STG=LTG)  GOALS Name Target Date  Goal status  1 Pt will demonstrate she has regained full shoulder ROM and function post operatively compared to baselines.  Baseline: 06/04/2022 INITIAL  2 Pt will demonstrate 170 degrees of left shoulder abduction to allow her to reach out to the side and return to PLOF.  06/04/2022 INITIAL  3 Pt will be independent in a home exercise program for ROM and strengthening including Strength ABC program  06/04/2022 INITIAL  4 Pt will report a 75% improvement in feeling of tightness in L axilla with less instances of tingling in LUE to allow improved comfort. 06/04/2022 INITIAL     PLAN: PT FREQUENCY/DURATION: 2x/wk for 4 weeks  PLAN FOR NEXT SESSION: PROM to L shoulder, resume AA/ROM exs as wounds appear more stable.  eventually supine scap and Strength ABC program   Stark Bray, PT 05/21/2022, 1:22 PM

## 2022-05-25 DIAGNOSIS — Z510 Encounter for antineoplastic radiation therapy: Secondary | ICD-10-CM | POA: Insufficient documentation

## 2022-05-25 DIAGNOSIS — C50212 Malignant neoplasm of upper-inner quadrant of left female breast: Secondary | ICD-10-CM | POA: Insufficient documentation

## 2022-05-25 DIAGNOSIS — Z170 Estrogen receptor positive status [ER+]: Secondary | ICD-10-CM | POA: Insufficient documentation
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
## 2022-05-27 ENCOUNTER — Encounter: Payer: Self-pay | Admitting: *Deleted

## 2022-05-27 ENCOUNTER — Ambulatory Visit: Payer: 59 | Attending: Hematology and Oncology

## 2022-05-27 DIAGNOSIS — Z170 Estrogen receptor positive status [ER+]: Secondary | ICD-10-CM | POA: Diagnosis present

## 2022-05-27 DIAGNOSIS — M25612 Stiffness of left shoulder, not elsewhere classified: Secondary | ICD-10-CM | POA: Insufficient documentation

## 2022-05-27 DIAGNOSIS — Z171 Estrogen receptor negative status [ER-]: Secondary | ICD-10-CM | POA: Insufficient documentation

## 2022-05-27 DIAGNOSIS — M25611 Stiffness of right shoulder, not elsewhere classified: Secondary | ICD-10-CM | POA: Diagnosis present

## 2022-05-27 DIAGNOSIS — R293 Abnormal posture: Secondary | ICD-10-CM | POA: Insufficient documentation

## 2022-05-27 DIAGNOSIS — C50212 Malignant neoplasm of upper-inner quadrant of left female breast: Secondary | ICD-10-CM | POA: Insufficient documentation

## 2022-05-27 DIAGNOSIS — M6281 Muscle weakness (generalized): Secondary | ICD-10-CM | POA: Diagnosis present

## 2022-05-27 DIAGNOSIS — Z483 Aftercare following surgery for neoplasm: Secondary | ICD-10-CM | POA: Diagnosis present

## 2022-05-27 NOTE — Therapy (Signed)
OUTPATIENT PHYSICAL THERAPY BREAST CANCER TREATMENT NOTE   Patient Name: Maria Diaz MRN: [id number] DOB:09/20/1971, 51 y.o., female Today's Date: 05/27/2022   PT End of Session - 05/27/22 1007     Visit Number 7    Number of Visits 10    Date for PT Re-Evaluation 06/04/22    PT Start Time 1002    PT Stop Time 1053    PT Time Calculation (min) 51 min    Activity Tolerance Patient tolerated treatment well    Behavior During Therapy WFL for tasks assessed/performed             Past Medical History:  Diagnosis Date   Anemia    Anxiety    Depression    Glaucoma    Hives    chronic, on Xolair injections   Past Surgical History:  Procedure Laterality Date   BREAST BIOPSY Left 01/19/2022   BREAST CYST EXCISION Left 01/28/2021   Procedure: LEFT BREAST MASS EXCISION;  Surgeon: Rolm Bookbinder, MD;  Location: [place];  Service: General;  Laterality: Left;  START TIME OF 3:00 PM FOR 60 MINUTES IN ROOM 8   BREAST LUMPECTOMY WITH RADIOACTIVE SEED AND SENTINEL LYMPH NODE BIOPSY Left 04/13/2022   Procedure: LEFT BREAST BRACKETED LUMPECTOMY WITH RADIOACTIVE SEED AND AXILLARY SENTINEL LYMPH NODE BIOPSY;  Surgeon: Rolm Bookbinder, MD;  Location: [place];  Service: General;  Laterality: Left;   HYSTEROSCOPY W/ ENDOMETRIAL ABLATION  08/14/2020   [facility]   MASTOPEXY Bilateral 04/21/2022   Procedure: MASTOPEXY;  Surgeon: Irene Limbo, MD;  Location: [place];  Service: Plastics;  Laterality: Bilateral;   Patient Active Problem List   Diagnosis Date Noted   Anemia 12/31/2021   Malignant neoplasm of upper-inner quadrant of left breast in female, estrogen receptor positive ([place]) 12/31/2021   Open angle with borderline findings and low glaucoma risk in both eyes 01/11/2016   Chronic idiopathic urticaria 01/11/2016   Age-related nuclear cataract of both eyes 01/11/2016   Eczema 01/11/2016    PCP: [name], MD    REFERRING PROVIDER: Rolm Bookbinder, MD   REFERRING DIAG: [phone] (ICD-10-CM) - Malignant neoplasm of upper-inner quadrant of left female breast   THERAPY DIAG:  Stiffness of left shoulder, not elsewhere classified  Muscle weakness (generalized)  Aftercare following surgery for neoplasm  Abnormal posture  Malignant neoplasm of upper-inner quadrant of left breast in female, estrogen receptor negative ([place])  Rationale for Evaluation and Treatment Rehabilitation  ONSET DATE: 12/09/21  SUBJECTIVE:  SUBJECTIVE STATEMENT: My Lt shoulder motion is much improved, I don't feel like I'm much limited with my ADLs now. My Rt incision is also healing well, Lt side looks good and radiation starts tomorrow.   PERTINENT HISTORY:  Patient was diagnosed on 12/09/21 with left grade 3. It measures 3 cm and is located in the upper inner quadrant. 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 04/13/22- underwent L breast lumpectomy and SLNB (0/3) 04/21/22- bilateral mastopexy   PATIENT GOALS:  Reassess how my recovery is going related to arm function, pain, and swelling.  PAIN:  Are you having pain? Not currently  PRECAUTIONS: Recent Surgery, left UE Lymphedema risk,   ACTIVITY LEVEL / LEISURE: been trying to do ROM exercises, pt reports she has been walking a half a mile several times recently   OBJECTIVE  COGNITION:            Overall cognitive status: Within functional limits for tasks assessed                POSTURE:  Forward head and rounded shoulders posture  OBSERVATION:  Bilateral healing mastopexy scars with inferior aspect still open bilaterally with white exudate. Right side is about 2 inch in diameter that is still open. Educated pt to make sure her doctor is aware as pt reports  she still is having significant drainage from this area.    UPPER EXTREMITY AROM/PROM:   A/PROM RIGHT  01/01/2022    Shoulder extension 61  Shoulder flexion 164  Shoulder abduction 173  Shoulder internal rotation 55  Shoulder external rotation 90                          (Blank rows = not tested)   A/PROM LEFT  01/01/2022 L 05/07/22 05/21/22  Shoulder extension 63 59 60  Shoulder flexion 165 165 165  Shoulder abduction 180 145 165  Shoulder internal rotation 68 71   Shoulder external rotation 88 80 88                          (Blank rows = not tested)     LYMPHEDEMA ASSESSMENTS:    LANDMARK RIGHT  01/01/2022  10 cm proximal to olecranon process 36.5  Olecranon process 30.5  10 cm proximal to ulnar styloid process 23.5  Just proximal to ulnar styloid process 18.1  Across hand at thumb web space 20.2  At base of 2nd digit 6.8  (Blank rows = not tested)   LANDMARK LEFT  01/01/2022 L 05/07/22  10 cm proximal to olecranon process 36.5 37  Olecranon process 31.1 31.5  10 cm proximal to ulnar styloid process 24.2 23.5  Just proximal to ulnar styloid process 18.7 19  Across hand at thumb web space 20.8 20.3  At base of 2nd digit 6.9 6.8  (Blank rows = not tested)     TODAY'S TREATMENT: 05/27/22: Therapeutic Exercises Pulleys into flexion and abduction x2 mins each, near full motion now Began instruction of supine scapular series with returns therapist demo for each (as incisions appear to be near closed and healing well) with yellow theraband with having pt only do 5 reps and did NOT do D2 to avoid increased pull at healing incision, did unzip pts bra as well to prevent any further friction at healing incision while performing exs Manual Therapy: PROM to the Rt shoulder into flexion, abduction, and D2 to  pts end motions which are near full now STM to Lt axilla over lymph node bed where scar tissue palpable but this has softened much since start of care and pt reports this feeling less  restrictive now as well.   05/21/22: Manual Therapy: PROM to the Rt shoulder into flexion, abduction focus  GH AP and inferior glides grade IV- 3x30" each Rt sidelying scapular mobilizations Therapeutic Exercise:  Extension presses into table with palm facing up 5" x 5 Pro/ret bil x 10 with initial tcs and vcs for completion ER/IR alternating isometrics 5" x 5 each x 2 Sidelying ER AROM x 10 Sidelying abduction to around 60deg before tingling starts and then posterior circles 2x5 with retraction focus  05/18/22: Manual Therapy MFR to Lt axilla during P/ROM P/ROM of Lt shoulder in supine into flexion, abd, D2, and er/IR to pts tolerance. She reports increased tingling at end abd but this continued to lessen some towards end of session. Scapular Mobs in Rt S/L to Lt scapula into protraction and retraction , scapular mobility some improved but still limited overall STM with cocoa butter along medial border of Lt scapula and Lt upper trap where palpable tightness present    PATIENT EDUCATION:  Education details: Supine scapular series (except D2) with yellow theraband Person educated: Patient Education method: Explanation and Handouts Education comprehension: verbalized understanding, returned demonstration and will benefit from further review   HOME EXERCISE PROGRAM:  Reviewed previously given post op HEP. Snow angel in supine on L  ASSESSMENT:  CLINICAL IMPRESSION: Pt has now received her compression bra and comes wearing this today. Appears to be a good fit and pt reports this feeling comfortable, just has to get used to wearing it. Continued AA/ROM with pulleys with pt now having near full motion today. As her Rt inferior breast incision appears to be healing well decide to progress her to supine scapular series with yellow theraband but did not do D2 motion to prevent pulling at incision. Pt did well with return therapist demo and so issued these to HEP with handout. Then  continued with manual therapy working to improve her end Lt shoulder P/ROM which is near full now. She has one more session Friday and is to determine if she would like to be placed on hold during radiation or cont 1x/wk. Pt did c/o Rt shoulder discomfort with horz abd ex today so PT to assess at Rt shoulder next session??  Pt will benefit from skilled therapeutic intervention to improve on the following deficits: Decreased knowledge of precautions, impaired UE functional use, pain, decreased ROM, postural dysfunction.   PT treatment/interventions: ADL/Self care home management, Therapeutic exercises, Therapeutic activity, Patient/Family education, Joint mobilization, Orthotic/Fit training, Manual lymph drainage, Compression bandaging, scar mobilization, and Manual therapy     GOALS: Goals reviewed with patient? Yes  LONG TERM GOALS:  (STG=LTG)  GOALS Name Target Date  Goal status  1 Pt will demonstrate she has regained full shoulder ROM and function post operatively compared to baselines.  Baseline: 06/04/2022 INITIAL  2 Pt will demonstrate 170 degrees of left shoulder abduction to allow her to reach out to the side and return to PLOF.  06/04/2022 INITIAL  3 Pt will be independent in a home exercise program for ROM and strengthening including Strength ABC program  06/04/2022 INITIAL  4 Pt will report a 75% improvement in feeling of tightness in L axilla with less instances of tingling in LUE to allow improved comfort. 06/04/2022 INITIAL  PLAN: PT FREQUENCY/DURATION: 2x/wk for 4 weeks  PLAN FOR NEXT SESSION: Reassess goals/measure A/ROM; PROM to L shoulder, resume AA/ROM exs as wounds appear more stable.  review supine scap and eventually Strength ABC program   Rosenberger, Valerie Ann, PTA 05/27/2022, 10:54 AM    Over Head Pull: Narrow and Wide Grip   Cancer Rehab [phone]   On back, knees bent, feet flat, band across thighs, elbows straight but relaxed. Pull hands apart (start).  Keeping elbows straight, bring arms up and over head, hands toward floor. Keep pull steady on band. Hold momentarily. Return slowly, keeping pull steady, back to start. Then do same with a wider grip on the band (past shoulder width) Repeat _5-10__ times. Band color __yellow____   Side Pull: Double Arm   On back, knees bent, feet flat. Arms perpendicular to body, shoulder level, elbows straight but relaxed. Pull arms out to sides, elbows straight. Resistance band comes across collarbones, hands toward floor. Hold momentarily. Slowly return to starting position. Repeat _5-10__ times. Band color _yellow____    Shoulder Rotation: Double Arm   On back, knees bent, feet flat, elbows tucked at sides, bent 90, hands palms up. Pull hands apart and down toward floor, keeping elbows near sides. Hold momentarily. Slowly return to starting position. Repeat _5-10__ times. Band color __yellow____   

## 2022-05-27 NOTE — Patient Instructions (Addendum)
Over Head Pull: Narrow and Wide Grip   Cancer Rehab  [phone]   On back, knees bent, feet flat, band across thighs, elbows straight but relaxed. Pull hands apart (start). Keeping elbows straight, bring arms up and over head, hands toward floor. Keep pull steady on band. Hold momentarily. Return slowly, keeping pull steady, back to start. Then do same with a wider grip on the band (past shoulder width) Repeat _5-10__ times. Band color __yellow____   Side Pull: Double Arm   On back, knees bent, feet flat. Arms perpendicular to body, shoulder level, elbows straight but relaxed. Pull arms out to sides, elbows straight. Resistance band comes across collarbones, hands toward floor. Hold momentarily. Slowly return to starting position. Repeat _5-10__ times. Band color _yellow____    Shoulder Rotation: Double Arm   On back, knees bent, feet flat, elbows tucked at sides, bent 90, hands palms up. Pull hands apart and down toward floor, keeping elbows near sides. Hold momentarily. Slowly return to starting position. Repeat _5-10__ times. Band color __yellow____

## 2022-05-28 ENCOUNTER — Ambulatory Visit (INDEPENDENT_AMBULATORY_CARE_PROVIDER_SITE_OTHER): Payer: 59 | Admitting: Psychiatry

## 2022-05-28 ENCOUNTER — Ambulatory Visit
Admission: RE | Admit: 2022-05-28 | Discharge: 2022-05-28 | Disposition: A | Discharge status: Home / Self Care | Payer: 59 | Source: Ambulatory Visit | Attending: Radiation Oncology | Admitting: Radiation Oncology

## 2022-05-28 ENCOUNTER — Other Ambulatory Visit: Payer: Self-pay

## 2022-05-28 DIAGNOSIS — F3342 Major depressive disorder, recurrent, in full remission: Secondary | ICD-10-CM | POA: Diagnosis not present

## 2022-05-28 DIAGNOSIS — C50212 Malignant neoplasm of upper-inner quadrant of left female breast: Secondary | ICD-10-CM

## 2022-05-28 DIAGNOSIS — F4310 Post-traumatic stress disorder, unspecified: Secondary | ICD-10-CM | POA: Diagnosis not present

## 2022-05-28 DIAGNOSIS — Z510 Encounter for antineoplastic radiation therapy: Secondary | ICD-10-CM | POA: Diagnosis not present

## 2022-05-28 LAB — RAD ONC ARIA SESSION SUMMARY
Course Elapsed Days: 0
Plan Fractions Treated to Date: 1
Plan Prescribed Dose Per Fraction: 1.8 Gy
Plan Total Fractions Prescribed: 28
Plan Total Prescribed Dose: 50.4 Gy
Reference Point Dosage Given to Date: 1.8 Gy
Reference Point Session Dosage Given: 1.8 Gy
Session Number: 1
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
## 2022-05-29 ENCOUNTER — Telehealth (INDEPENDENT_AMBULATORY_CARE_PROVIDER_SITE_OTHER): Payer: 59 | Admitting: Psychiatry

## 2022-05-29 ENCOUNTER — Ambulatory Visit: Payer: 59 | Admitting: Physical Therapy

## 2022-05-29 ENCOUNTER — Encounter: Payer: Self-pay | Admitting: Psychiatry

## 2022-05-29 ENCOUNTER — Encounter: Payer: Self-pay | Admitting: Physical Therapy

## 2022-05-29 ENCOUNTER — Other Ambulatory Visit: Payer: Self-pay

## 2022-05-29 ENCOUNTER — Ambulatory Visit
Admission: RE | Admit: 2022-05-29 | Discharge: 2022-05-29 | Disposition: A | Discharge status: Home / Self Care | Payer: 59 | Source: Ambulatory Visit | Attending: Radiation Oncology | Admitting: Radiation Oncology

## 2022-05-29 DIAGNOSIS — M6281 Muscle weakness (generalized): Secondary | ICD-10-CM

## 2022-05-29 DIAGNOSIS — C50212 Malignant neoplasm of upper-inner quadrant of left female breast: Secondary | ICD-10-CM

## 2022-05-29 DIAGNOSIS — M25612 Stiffness of left shoulder, not elsewhere classified: Secondary | ICD-10-CM

## 2022-05-29 DIAGNOSIS — F4310 Post-traumatic stress disorder, unspecified: Secondary | ICD-10-CM | POA: Diagnosis not present

## 2022-05-29 DIAGNOSIS — F3342 Major depressive disorder, recurrent, in full remission: Secondary | ICD-10-CM | POA: Diagnosis not present

## 2022-05-29 DIAGNOSIS — Z510 Encounter for antineoplastic radiation therapy: Secondary | ICD-10-CM | POA: Diagnosis not present

## 2022-05-29 DIAGNOSIS — G4700 Insomnia, unspecified: Secondary | ICD-10-CM

## 2022-05-29 DIAGNOSIS — M25611 Stiffness of right shoulder, not elsewhere classified: Secondary | ICD-10-CM

## 2022-05-29 DIAGNOSIS — Z483 Aftercare following surgery for neoplasm: Secondary | ICD-10-CM

## 2022-05-29 DIAGNOSIS — R293 Abnormal posture: Secondary | ICD-10-CM

## 2022-05-29 LAB — RAD ONC ARIA SESSION SUMMARY
Course Elapsed Days: 1
Plan Fractions Treated to Date: 2
Plan Prescribed Dose Per Fraction: 1.8 Gy
Plan Total Fractions Prescribed: 28
Plan Total Prescribed Dose: 50.4 Gy
Reference Point Dosage Given to Date: 3.6 Gy
Reference Point Session Dosage Given: 1.8 Gy
Session Number: 2

## 2022-05-29 MED ORDER — SERTRALINE HCL 100 MG PO TABS: 200.0000 mg | ORAL_TABLET | Freq: Every day | ORAL | 1 refills | Status: DC

## 2022-05-29 MED ORDER — TRAZODONE HCL 50 MG PO TABS: 25.0000 mg | ORAL_TABLET | Freq: Every evening | ORAL | 1 refills | Status: DC | PRN

## 2022-05-29 MED ORDER — HYDROXYZINE HCL 25 MG PO TABS: 25.0000 mg | ORAL_TABLET | Freq: Every day | ORAL | 0 refills | Status: DC | PRN

## 2022-05-29 NOTE — Therapy (Signed)
OUTPATIENT PHYSICAL THERAPY BREAST CANCER TREATMENT NOTE   Patient Name: Maria Diaz MRN: [id number] DOB:08-Mar-1971, 51 y.o., female Today's Date: 05/29/2022   PT End of Session - 05/29/22 0905     Visit Number 8    Number of Visits 10    Date for PT Re-Evaluation 06/04/22    PT Start Time 0905    PT Stop Time 0947    PT Time Calculation (min) 42 min    Activity Tolerance Patient tolerated treatment well    Behavior During Therapy [facility] for tasks assessed/performed             Past Medical History:  Diagnosis Date   Anemia    Anxiety    Depression    Glaucoma    Hives    chronic, on Xolair injections   Past Surgical History:  Procedure Laterality Date   BREAST BIOPSY Left 01/19/2022   BREAST CYST EXCISION Left 01/28/2021   Procedure: LEFT BREAST MASS EXCISION;  Surgeon: Rolm Bookbinder, MD;  Location: [place];  Service: General;  Laterality: Left;  START TIME OF 3:00 PM FOR 60 MINUTES IN ROOM 8   BREAST LUMPECTOMY WITH RADIOACTIVE SEED AND SENTINEL LYMPH NODE BIOPSY Left 04/13/2022   Procedure: LEFT BREAST BRACKETED LUMPECTOMY WITH RADIOACTIVE SEED AND AXILLARY SENTINEL LYMPH NODE BIOPSY;  Surgeon: Rolm Bookbinder, MD;  Location: [place];  Service: General;  Laterality: Left;   HYSTEROSCOPY W/ ENDOMETRIAL ABLATION  08/14/2020   [facility]   MASTOPEXY Bilateral 04/21/2022   Procedure: MASTOPEXY;  Surgeon: Irene Limbo, MD;  Location: [place];  Service: Plastics;  Laterality: Bilateral;   Patient Active Problem List   Diagnosis Date Noted   Anemia 12/31/2021   Malignant neoplasm of upper-inner quadrant of left breast in female, estrogen receptor positive ([place]) 12/31/2021   Open angle with borderline findings and low glaucoma risk in both eyes 01/11/2016   Chronic idiopathic urticaria 01/11/2016   Age-related nuclear cataract of both eyes 01/11/2016   Eczema 01/11/2016    PCP: [name], MD    REFERRING PROVIDER: Rolm Bookbinder, MD   REFERRING DIAG: [phone] (ICD-10-CM) - Malignant neoplasm of upper-inner quadrant of left female breast   THERAPY DIAG:  Stiffness of left shoulder, not elsewhere classified  Muscle weakness (generalized)  Aftercare following surgery for neoplasm  Abnormal posture  Malignant neoplasm of upper-inner quadrant of left breast in female, estrogen receptor negative ([place])  Rationale for Evaluation and Treatment Rehabilitation  ONSET DATE: 12/09/21  SUBJECTIVE:  SUBJECTIVE STATEMENT: I am starting to have some tightness in my L shoulder since starting radiation yesterday. The exercises are going well.   PERTINENT HISTORY:  Patient was diagnosed on 12/09/21 with left grade 3. It measures 3 cm and is located in the upper inner quadrant. 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 04/13/22- underwent L breast lumpectomy and SLNB (0/3) 04/21/22- bilateral mastopexy   PATIENT GOALS:  Reassess how my recovery is going related to arm function, pain, and swelling.  PAIN:  Are you having pain? 58/10  Sharp, shooting pain, stinging (r only) in bilateral breast  Intermittent  Nothing makes it better  Moving or lying in certain positions can make it worse  PRECAUTIONS: Recent Surgery, left UE Lymphedema risk,   ACTIVITY LEVEL / LEISURE: been trying to do ROM exercises, pt reports she has been walking a half a mile several times recently   OBJECTIVE  COGNITION:            Overall cognitive status: Within functional limits for tasks assessed                POSTURE:  Forward head and rounded shoulders posture  OBSERVATION:  Bilateral healing mastopexy scars with inferior aspect still open bilaterally with white exudate. Right side is about 2 inch in  diameter that is still open. Educated pt to make sure her doctor is aware as pt reports she still is having significant drainage from this area.    UPPER EXTREMITY AROM/PROM:   A/PROM RIGHT  01/01/2022   R 05/29/22  Shoulder extension 61 41  Shoulder flexion 164 132  Shoulder abduction 173 159  Shoulder internal rotation 55 58  Shoulder external rotation 90 80                          (Blank rows = not tested)   A/PROM LEFT  01/01/2022 L 05/07/22 05/21/22 05/29/22  Shoulder extension 63 59 60 41  Shoulder flexion 165 165 165 141  Shoulder abduction 180 145 165 139  Shoulder internal rotation 68 71  61  Shoulder external rotation 88 80 88 65                          (Blank rows = not tested)     LYMPHEDEMA ASSESSMENTS:    LANDMARK RIGHT  01/01/2022  10 cm proximal to olecranon process 36.5  Olecranon process 30.5  10 cm proximal to ulnar styloid process 23.5  Just proximal to ulnar styloid process 18.1  Across hand at thumb web space 20.2  At base of 2nd digit 6.8  (Blank rows = not tested)   LANDMARK LEFT  01/01/2022 L 05/07/22  10 cm proximal to olecranon process 36.5 37  Olecranon process 31.1 31.5  10 cm proximal to ulnar styloid process 24.2 23.5  Just proximal to ulnar styloid process 18.7 19  Across hand at thumb web space 20.8 20.3  At base of 2nd digit 6.9 6.8  (Blank rows = not tested)     TODAY'S TREATMENT: 05/29/22 Therapeutic Exercise:  Pulleys in to flexion and abduction x 2 min each Manual Therapy: PROM to bilateral shoulders in to flexion, abduction and ER with pt very limited today -see ROM table but did improve with manual therapy  05/27/22: Therapeutic Exercises Pulleys into flexion and abduction x2 mins each, near full motion now Began instruction of supine scapular series  with returns therapist demo for each (as incisions appear to be near closed and healing well) with yellow theraband with having pt only do 5 reps and did NOT do D2 to avoid increased pull at  healing incision, did unzip pts bra as well to prevent any further friction at healing incision while performing exs Manual Therapy: PROM to the Rt shoulder into flexion, abduction, and D2 to pts end motions which are near full now STM to Lt axilla over lymph node bed where scar tissue palpable but this has softened much since start of care and pt reports this feeling less restrictive now as well.   05/21/22: Manual Therapy: PROM to the Rt shoulder into flexion, abduction focus  GH AP and inferior glides grade IV- 3x30" each Rt sidelying scapular mobilizations Therapeutic Exercise:  Extension presses into table with palm facing up 5" x 5 Pro/ret bil x 10 with initial tcs and vcs for completion ER/IR alternating isometrics 5" x 5 each x 2 Sidelying ER AROM x 10 Sidelying abduction to around 60deg before tingling starts and then posterior circles 2x5 with retraction focus  05/18/22: Manual Therapy MFR to Lt axilla during P/ROM P/ROM of Lt shoulder in supine into flexion, abd, D2, and er/IR to pts tolerance. She reports increased tingling at end abd but this continued to lessen some towards end of session. Scapular Mobs in Rt S/L to Lt scapula into protraction and retraction , scapular mobility some improved but still limited overall STM with cocoa butter along medial border of Lt scapula and Lt upper trap where palpable tightness present    PATIENT EDUCATION:  Education details: Supine scapular series (except D2) with yellow theraband Person educated: Patient Education method: Explanation and Handouts Education comprehension: verbalized understanding, returned demonstration and will benefit from further review   HOME EXERCISE PROGRAM:  Reviewed previously given post op HEP. Snow angel in supine on L  ASSESSMENT:  CLINICAL IMPRESSION: Pt reports she is now having R shoulder stiffness and her L shoulder has been more tight since she began radiation. Measured ROM today and L ROM has  significantly decreased from the last time she was measured and her R shoulder is significantly decreased from eval. Added R shoulder ROM to her POC to address this. Focused today on PROM to bilateral shoulders to decrease tightness and improve motion. Pt would benefit from skilled PT services to improve bilateral shoulder ROM and strength.   Pt will benefit from skilled therapeutic intervention to improve on the following deficits: Decreased knowledge of precautions, impaired UE functional use, pain, decreased ROM, postural dysfunction.   PT treatment/interventions: ADL/Self care home management, Therapeutic exercises, Therapeutic activity, Patient/Family education, Joint mobilization, Orthotic/Fit training, Manual lymph drainage, Compression bandaging, scar mobilization, and Manual therapy     GOALS: Goals reviewed with patient? Yes  LONG TERM GOALS:  (STG=LTG)  GOALS Name Target Date  Goal status  1 Pt will demonstrate she has regained full shoulder ROM and function post operatively compared to baselines.  Baseline: 06/04/2022 IN PROGRESS  2 Pt will demonstrate 170 degrees of left shoulder abduction to allow her to reach out to the side and return to PLOF.  06/04/2022 IN PROGRESS  3 Pt will be independent in a home exercise program for ROM and strengthening including Strength ABC program  06/04/2022 IN PROGRESS  4 Pt will report a 75% improvement in feeling of tightness in L axilla with less instances of tingling in LUE to allow improved comfort. 06/04/2022 IN PROGRESS  PLAN: PT FREQUENCY/DURATION: 2x/wk for 4 weeks  PLAN FOR NEXT SESSION: Reassess goals/measure A/ROM; PROM to L shoulder and R shoulder, resume AA/ROM exs as wounds appear more stable.  review supine scap once ROM returns and eventually Strength ABC program   [facility], PT 05/29/2022, 9:52 AM

## 2022-06-01 ENCOUNTER — Ambulatory Visit
Admission: RE | Admit: 2022-06-01 | Discharge: 2022-06-01 | Disposition: A | Discharge status: Home / Self Care | Payer: 59 | Source: Ambulatory Visit | Attending: Radiation Oncology | Admitting: Radiation Oncology

## 2022-06-01 ENCOUNTER — Ambulatory Visit: Payer: 59

## 2022-06-01 ENCOUNTER — Other Ambulatory Visit: Payer: Self-pay

## 2022-06-01 DIAGNOSIS — Z510 Encounter for antineoplastic radiation therapy: Secondary | ICD-10-CM | POA: Diagnosis not present

## 2022-06-01 LAB — RAD ONC ARIA SESSION SUMMARY
Course Elapsed Days: 4
Plan Fractions Treated to Date: 3
Plan Prescribed Dose Per Fraction: 1.8 Gy
Plan Total Fractions Prescribed: 28
Plan Total Prescribed Dose: 50.4 Gy
Reference Point Dosage Given to Date: 5.4 Gy
Reference Point Session Dosage Given: 1.8 Gy
Session Number: 3

## 2022-06-02 ENCOUNTER — Other Ambulatory Visit: Payer: Self-pay

## 2022-06-02 ENCOUNTER — Ambulatory Visit
Admission: RE | Admit: 2022-06-02 | Discharge: 2022-06-02 | Disposition: A | Discharge status: Home / Self Care | Payer: 59 | Source: Ambulatory Visit | Attending: Radiation Oncology | Admitting: Radiation Oncology

## 2022-06-02 ENCOUNTER — Ambulatory Visit: Payer: 59

## 2022-06-02 DIAGNOSIS — C50212 Malignant neoplasm of upper-inner quadrant of left female breast: Secondary | ICD-10-CM

## 2022-06-02 DIAGNOSIS — Z510 Encounter for antineoplastic radiation therapy: Secondary | ICD-10-CM | POA: Diagnosis not present

## 2022-06-02 LAB — RAD ONC ARIA SESSION SUMMARY
Course Elapsed Days: 5
Plan Fractions Treated to Date: 4
Plan Prescribed Dose Per Fraction: 1.8 Gy
Plan Total Fractions Prescribed: 28
Plan Total Prescribed Dose: 50.4 Gy
Reference Point Dosage Given to Date: 7.2 Gy
Reference Point Session Dosage Given: 1.8 Gy
Session Number: 4

## 2022-06-02 MED ORDER — ALRA NON-METALLIC DEODORANT (RAD-ONC)
1.0000 | Freq: Once | TOPICAL | Status: AC
  Administered 2022-06-02: 1 via TOPICAL

## 2022-06-02 MED ORDER — RADIAPLEXRX EX GEL: Freq: Once | CUTANEOUS | Status: AC

## 2022-06-02 NOTE — Progress Notes (Signed)
Pt here for patient teaching.    Pt given Radiation and You booklet, skin care instructions, Alra deodorant, and Radiaplex gel.    Reviewed areas of pertinence such as fatigue, hair loss in treatment field, skin changes, breast tenderness, and breast swelling .   Pt able to give teach back of to pat skin,apply Radiaplex bid, avoid applying anything to skin within 4 hours of treatment, avoid wearing an under wire bra, and to use an electric razor if they must shave.   Pt verbalizes understanding\ of information given and will contact nursing with any questions or concerns.

## 2022-06-03 ENCOUNTER — Ambulatory Visit
Admission: RE | Admit: 2022-06-03 | Discharge: 2022-06-03 | Disposition: A | Discharge status: Home / Self Care | Payer: 59 | Source: Ambulatory Visit | Attending: Radiation Oncology | Admitting: Radiation Oncology

## 2022-06-03 ENCOUNTER — Other Ambulatory Visit: Payer: Self-pay

## 2022-06-03 DIAGNOSIS — Z510 Encounter for antineoplastic radiation therapy: Secondary | ICD-10-CM | POA: Diagnosis not present

## 2022-06-03 LAB — RAD ONC ARIA SESSION SUMMARY
Course Elapsed Days: 6
Plan Fractions Treated to Date: 5
Plan Prescribed Dose Per Fraction: 1.8 Gy
Plan Total Fractions Prescribed: 28
Plan Total Prescribed Dose: 50.4 Gy
Reference Point Dosage Given to Date: 9 Gy
Reference Point Session Dosage Given: 1.8 Gy
Session Number: 5

## 2022-06-04 ENCOUNTER — Ambulatory Visit
Admission: RE | Admit: 2022-06-04 | Discharge: 2022-06-04 | Disposition: A | Discharge status: Home / Self Care | Payer: 59 | Source: Ambulatory Visit | Attending: Radiation Oncology | Admitting: Radiation Oncology

## 2022-06-04 ENCOUNTER — Other Ambulatory Visit: Payer: Self-pay

## 2022-06-04 ENCOUNTER — Ambulatory Visit: Payer: 59

## 2022-06-04 DIAGNOSIS — M25612 Stiffness of left shoulder, not elsewhere classified: Secondary | ICD-10-CM | POA: Diagnosis not present

## 2022-06-04 DIAGNOSIS — M6281 Muscle weakness (generalized): Secondary | ICD-10-CM

## 2022-06-04 DIAGNOSIS — Z483 Aftercare following surgery for neoplasm: Secondary | ICD-10-CM

## 2022-06-04 DIAGNOSIS — Z510 Encounter for antineoplastic radiation therapy: Secondary | ICD-10-CM | POA: Diagnosis not present

## 2022-06-04 DIAGNOSIS — R293 Abnormal posture: Secondary | ICD-10-CM

## 2022-06-04 DIAGNOSIS — M25611 Stiffness of right shoulder, not elsewhere classified: Secondary | ICD-10-CM

## 2022-06-04 DIAGNOSIS — Z171 Estrogen receptor negative status [ER-]: Secondary | ICD-10-CM

## 2022-06-04 LAB — RAD ONC ARIA SESSION SUMMARY
Course Elapsed Days: 7
Plan Fractions Treated to Date: 6
Plan Prescribed Dose Per Fraction: 1.8 Gy
Plan Total Fractions Prescribed: 28
Plan Total Prescribed Dose: 50.4 Gy
Reference Point Dosage Given to Date: 10.8 Gy
Reference Point Session Dosage Given: 1.8 Gy
Session Number: 6

## 2022-06-04 NOTE — Therapy (Signed)
OUTPATIENT PHYSICAL THERAPY BREAST CANCER TREATMENT NOTE   Patient Name: Maria Diaz MRN: [id number] DOB:Oct 01, 1971, 51 y.o., female Today's Date: 06/04/2022   PT End of Session - 06/04/22 1205     Visit Number 9    Number of Visits 10    Date for PT Re-Evaluation 06/04/22    PT Start Time 1202    PT Stop Time 5868    PT Time Calculation (min) 57 min    Activity Tolerance Patient tolerated treatment well    Behavior During Therapy WFL for tasks assessed/performed             Past Medical History:  Diagnosis Date   Anemia    Anxiety    Depression    Glaucoma    Hives    chronic, on Xolair injections   Past Surgical History:  Procedure Laterality Date   BREAST BIOPSY Left 01/19/2022   BREAST CYST EXCISION Left 01/28/2021   Procedure: LEFT BREAST MASS EXCISION;  Surgeon: Rolm Bookbinder, MD;  Location: [place];  Service: General;  Laterality: Left;  START TIME OF 3:00 PM FOR 60 MINUTES IN ROOM 8   BREAST LUMPECTOMY WITH RADIOACTIVE SEED AND SENTINEL LYMPH NODE BIOPSY Left 04/13/2022   Procedure: LEFT BREAST BRACKETED LUMPECTOMY WITH RADIOACTIVE SEED AND AXILLARY SENTINEL LYMPH NODE BIOPSY;  Surgeon: Rolm Bookbinder, MD;  Location: [place];  Service: General;  Laterality: Left;   HYSTEROSCOPY W/ ENDOMETRIAL ABLATION  08/14/2020   [facility]   MASTOPEXY Bilateral 04/21/2022   Procedure: MASTOPEXY;  Surgeon: Irene Limbo, MD;  Location: [place];  Service: Plastics;  Laterality: Bilateral;   Patient Active Problem List   Diagnosis Date Noted   Anemia 12/31/2021   Malignant neoplasm of upper-inner quadrant of left breast in female, estrogen receptor positive ([place]) 12/31/2021   Open angle with borderline findings and low glaucoma risk in both eyes 01/11/2016   Chronic idiopathic urticaria 01/11/2016   Age-related nuclear cataract of both eyes 01/11/2016   Eczema 01/11/2016    PCP: [name], MD    REFERRING PROVIDER: Rolm Bookbinder, MD   REFERRING DIAG: [phone] (ICD-10-CM) - Malignant neoplasm of upper-inner quadrant of left female breast   THERAPY DIAG:  Stiffness of left shoulder, not elsewhere classified  Stiffness of right shoulder, not elsewhere classified  Muscle weakness (generalized)  Aftercare following surgery for neoplasm  Abnormal posture  Malignant neoplasm of upper-inner quadrant of left breast in female, estrogen receptor negative ([place])  Rationale for Evaluation and Treatment Rehabilitation  ONSET DATE: 12/09/21  SUBJECTIVE:  SUBJECTIVE STATEMENT: My ROM feels better since last week in both shoulders.   PERTINENT HISTORY:  Patient was diagnosed on 12/09/21 with left grade 3. It measures 3 cm and is located in the upper inner quadrant. 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 04/13/22- underwent L breast lumpectomy and SLNB (0/3) 04/21/22- bilateral mastopexy   PATIENT GOALS:  Reassess how my recovery is going related to arm function, pain, and swelling.  PAIN:  Are you having pain? 51/10  Sharp, shooting pain, stinging (r only) in bilateral breast  Intermittent  Nothing makes it better  Moving or lying in certain positions can make it worse  PRECAUTIONS: Recent Surgery, left UE Lymphedema risk,   ACTIVITY LEVEL / LEISURE: been trying to do ROM exercises, pt reports she has been walking a half a mile several times recently   OBJECTIVE  COGNITION:            Overall cognitive status: Within functional limits for tasks assessed                POSTURE:  Forward head and rounded shoulders posture  OBSERVATION:  Bilateral healing mastopexy scars with inferior aspect still open bilaterally with white exudate. Right side is about 2 inch in  diameter that is still open. Educated pt to make sure her doctor is aware as pt reports she still is having significant drainage from this area.    UPPER EXTREMITY AROM/PROM:   A/PROM RIGHT  01/01/2022   R 05/29/22 06/04/22  Shoulder extension 61 41 61  Shoulder flexion 164 132 151  Shoulder abduction 173 159 175  Shoulder internal rotation 55 58 63  Shoulder external rotation 90 80 82                          (Blank rows = not tested)   A/PROM LEFT  01/01/2022 L 05/07/22 05/21/22 05/29/22 06/04/22  Shoulder extension 63 59 60 41 39  Shoulder flexion 165 165 165 141 149  Shoulder abduction 180 145 165 139 154  Shoulder internal rotation 68 71  61 59  Shoulder external rotation 88 80 88 65 90                          (Blank rows = not tested)     LYMPHEDEMA ASSESSMENTS:    LANDMARK RIGHT  01/01/2022  10 cm proximal to olecranon process 36.5  Olecranon process 30.5  10 cm proximal to ulnar styloid process 23.5  Just proximal to ulnar styloid process 18.1  Across hand at thumb web space 20.2  At base of 2nd digit 6.8  (Blank rows = not tested)   LANDMARK LEFT  01/01/2022 L 05/07/22  10 cm proximal to olecranon process 36.5 37  Olecranon process 31.1 31.5  10 cm proximal to ulnar styloid process 24.2 23.5  Just proximal to ulnar styloid process 18.7 19  Across hand at thumb web space 20.8 20.3  At base of 2nd digit 6.9 6.8  (Blank rows = not tested)     TODAY'S TREATMENT: 06/04/22: Therapeutic Exercises: Pulleys in to flexion and abduction x 2 min each with VCs to decrease bil scapular retraction Ball roll up wall into flexion x10 and Rt and then Lt abd x5 each returning therapist demo Modified Downward dog on wall 5x, 5 sec holds Supine over foam roll for bil UE horz abd, then bil UE into  scaption x10 each and then "snow angel" x5 Manual Therapy PROM in supine to bilateral shoulders in to flexion, abduction, ER and IR, this is improved from last week STM to anterior shoulder where  tightness limits end IR  05/29/22 Therapeutic Exercise:  Pulleys in to flexion and abduction x 2 min each Manual Therapy: PROM to bilateral shoulders in to flexion, abduction and ER with pt very limited today -see ROM table but did improve with manual therapy  05/27/22: Therapeutic Exercises Pulleys into flexion and abduction x2 mins each, near full motion now Began instruction of supine scapular series with returns therapist demo for each (as incisions appear to be near closed and healing well) with yellow theraband with having pt only do 5 reps and did NOT do D2 to avoid increased pull at healing incision, did unzip pts bra as well to prevent any further friction at healing incision while performing exs Manual Therapy: PROM to the Rt shoulder into flexion, abduction, and D2 to pts end motions which are near full now STM to Lt axilla over lymph node bed where scar tissue palpable but this has softened much since start of care and pt reports this feeling less restrictive now as well.   05/21/22: Manual Therapy: PROM to the Rt shoulder into flexion, abduction focus  GH AP and inferior glides grade IV- 3x30" each Rt sidelying scapular mobilizations Therapeutic Exercise:  Extension presses into table with palm facing up 5" x 5 Pro/ret bil x 10 with initial tcs and vcs for completion ER/IR alternating isometrics 5" x 5 each x 2 Sidelying ER AROM x 10 Sidelying abduction to around 60deg before tingling starts and then posterior circles 2x5 with retraction focus   PATIENT EDUCATION:  Education details: Supine scapular series (except D2) with yellow theraband Person educated: Patient Education method: Explanation and Handouts Education comprehension: verbalized understanding, returned demonstration and will benefit from further review   HOME EXERCISE PROGRAM:  Reviewed previously given post op HEP. Snow angel in supine on L  ASSESSMENT:  CLINICAL IMPRESSION: Pt reports today her bil  shoulder A/ROM has been feeling better since last week. Her A/ROM was improved today also. Progressed her A/AA/ROM exs and continued with manual therapy. Her motion is improving well in bil shoulders.   Pt will benefit from skilled therapeutic intervention to improve on the following deficits: Decreased knowledge of precautions, impaired UE functional use, pain, decreased ROM, postural dysfunction.   PT treatment/interventions: ADL/Self care home management, Therapeutic exercises, Therapeutic activity, Patient/Family education, Joint mobilization, Orthotic/Fit training, Manual lymph drainage, Compression bandaging, scar mobilization, and Manual therapy     GOALS: Goals reviewed with patient? Yes  LONG TERM GOALS:  (STG=LTG)  GOALS Name Target Date  Goal status  1 Pt will demonstrate she has regained full shoulder ROM and function post operatively compared to baselines.  Baseline: 06/04/2022 IN PROGRESS  2 Pt will demonstrate 170 degrees of left shoulder abduction to allow her to reach out to the side and return to PLOF.  06/04/2022 IN PROGRESS  3 Pt will be independent in a home exercise program for ROM and strengthening including Strength ABC program  06/04/2022 IN PROGRESS  4 Pt will report a 75% improvement in feeling of tightness in L axilla with less instances of tingling in LUE to allow improved comfort. 06/04/2022 IN PROGRESS     PLAN: PT FREQUENCY/DURATION: 2x/wk for 4 weeks  PLAN FOR NEXT SESSION: Reassess goals/renewal next;  cont A/AA/PROM to L shoulder and R shoulder, resume  supine scap once ROM returns (possibly next session) and eventually Strength ABC program   Otelia Limes, PTA 06/04/2022, 1:00 PM

## 2022-06-05 ENCOUNTER — Ambulatory Visit
Admission: RE | Admit: 2022-06-05 | Discharge: 2022-06-05 | Disposition: A | Discharge status: Home / Self Care | Payer: 59 | Source: Ambulatory Visit | Attending: Radiation Oncology | Admitting: Radiation Oncology

## 2022-06-05 ENCOUNTER — Ambulatory Visit: Payer: 59

## 2022-06-05 ENCOUNTER — Other Ambulatory Visit: Payer: Self-pay

## 2022-06-05 DIAGNOSIS — R293 Abnormal posture: Secondary | ICD-10-CM

## 2022-06-05 DIAGNOSIS — M6281 Muscle weakness (generalized): Secondary | ICD-10-CM

## 2022-06-05 DIAGNOSIS — M25612 Stiffness of left shoulder, not elsewhere classified: Secondary | ICD-10-CM | POA: Diagnosis not present

## 2022-06-05 DIAGNOSIS — M25611 Stiffness of right shoulder, not elsewhere classified: Secondary | ICD-10-CM

## 2022-06-05 DIAGNOSIS — Z483 Aftercare following surgery for neoplasm: Secondary | ICD-10-CM

## 2022-06-05 DIAGNOSIS — Z171 Estrogen receptor negative status [ER-]: Secondary | ICD-10-CM

## 2022-06-05 DIAGNOSIS — Z510 Encounter for antineoplastic radiation therapy: Secondary | ICD-10-CM | POA: Diagnosis not present

## 2022-06-05 LAB — RAD ONC ARIA SESSION SUMMARY
Course Elapsed Days: 8
Plan Fractions Treated to Date: 7
Plan Prescribed Dose Per Fraction: 1.8 Gy
Plan Total Fractions Prescribed: 28
Plan Total Prescribed Dose: 50.4 Gy
Reference Point Dosage Given to Date: 12.6 Gy
Reference Point Session Dosage Given: 1.8 Gy
Session Number: 7

## 2022-06-05 NOTE — Therapy (Addendum)
OUTPATIENT PHYSICAL THERAPY BREAST CANCER TREATMENT NOTE   Patient Name: Maria Diaz MRN: [id number] DOB:12-Oct-1971, 51 y.o., female Today's Date: 06/05/2022   PT End of Session - 06/05/22 1013     Visit Number 10    Number of Visits 18    Date for PT Re-Evaluation 07/03/22    PT Start Time 1009    PT Stop Time 1102    PT Time Calculation (min) 53 min    Activity Tolerance Patient tolerated treatment well    Behavior During Therapy WFL for tasks assessed/performed             Past Medical History:  Diagnosis Date   Anemia    Anxiety    Depression    Glaucoma    Hives    chronic, on Xolair injections   Past Surgical History:  Procedure Laterality Date   BREAST BIOPSY Left 01/19/2022   BREAST CYST EXCISION Left 01/28/2021   Procedure: LEFT BREAST MASS EXCISION;  Surgeon: Rolm Bookbinder, MD;  Location: [place];  Service: General;  Laterality: Left;  START TIME OF 3:00 PM FOR 60 MINUTES IN ROOM 8   BREAST LUMPECTOMY WITH RADIOACTIVE SEED AND SENTINEL LYMPH NODE BIOPSY Left 04/13/2022   Procedure: LEFT BREAST BRACKETED LUMPECTOMY WITH RADIOACTIVE SEED AND AXILLARY SENTINEL LYMPH NODE BIOPSY;  Surgeon: Rolm Bookbinder, MD;  Location: [place];  Service: General;  Laterality: Left;   HYSTEROSCOPY W/ ENDOMETRIAL ABLATION  08/14/2020   [facility]   MASTOPEXY Bilateral 04/21/2022   Procedure: MASTOPEXY;  Surgeon: Irene Limbo, MD;  Location: [place];  Service: Plastics;  Laterality: Bilateral;   Patient Active Problem List   Diagnosis Date Noted   Anemia 12/31/2021   Malignant neoplasm of upper-inner quadrant of left breast in female, estrogen receptor positive ([place]) 12/31/2021   Open angle with borderline findings and low glaucoma risk in both eyes 01/11/2016   Chronic idiopathic urticaria 01/11/2016   Age-related nuclear cataract of both eyes 01/11/2016   Eczema 01/11/2016    PCP: [name], MD    REFERRING PROVIDER: Rolm Bookbinder, MD   REFERRING DIAG: [id number] (ICD-10-CM) - Malignant neoplasm of upper-inner quadrant of left female breast   THERAPY DIAG:  Stiffness of left shoulder, not elsewhere classified  Stiffness of right shoulder, not elsewhere classified  Muscle weakness (generalized)  Aftercare following surgery for neoplasm  Abnormal posture  Malignant neoplasm of upper-inner quadrant of left breast in female, estrogen receptor negative ([place])  Rationale for Evaluation and Treatment Rehabilitation  ONSET DATE: 12/09/21  SUBJECTIVE:  SUBJECTIVE STATEMENT: I'm doing much better overall but would like to cont physical therapy for now since my incisions have healed and now we can do more.   PERTINENT HISTORY:  Patient was diagnosed on 12/09/21 with left grade 3. It measures 3 cm and is located in the upper inner quadrant. 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 04/13/22- underwent L breast lumpectomy and SLNB (0/3) 04/21/22- bilateral mastopexy   PATIENT GOALS:  Reassess how my recovery is going related to arm function, pain, and swelling.  PAIN:  Are you having pain? 47/10  Sharp, shooting pain, stinging (r only) in bilateral breast  Intermittent  Nothing makes it better  Moving or lying in certain positions can make it worse  PRECAUTIONS: Recent Surgery, left UE Lymphedema risk,   ACTIVITY LEVEL / LEISURE: been trying to do ROM exercises, pt reports she has been walking a half a mile several times recently   OBJECTIVE  COGNITION:            Overall cognitive status: Within functional limits for tasks assessed                POSTURE:  Forward head and rounded shoulders posture  OBSERVATION:  Bilateral healing mastopexy scars with inferior aspect  still open bilaterally with white exudate. Right side is about 2 inch in diameter that is still open. Educated pt to make sure her doctor is aware as pt reports she still is having significant drainage from this area.    UPPER EXTREMITY AROM/PROM:   A/PROM RIGHT  01/01/2022   R 05/29/22 06/04/22  Shoulder extension 61 41 61  Shoulder flexion 164 132 151  Shoulder abduction 173 159 175  Shoulder internal rotation 55 58 63  Shoulder external rotation 90 80 82                          (Blank rows = not tested)   A/PROM LEFT  01/01/2022 L 05/07/22 05/21/22 05/29/22 06/04/22  Shoulder extension 63 59 60 41 39  Shoulder flexion 165 165 165 141 149  Shoulder abduction 180 145 165 139 154  Shoulder internal rotation 68 71  61 59  Shoulder external rotation 88 80 88 65 90                          (Blank rows = not tested)     LYMPHEDEMA ASSESSMENTS:    LANDMARK RIGHT  01/01/2022  10 cm proximal to olecranon process 36.5  Olecranon process 30.5  10 cm proximal to ulnar styloid process 23.5  Just proximal to ulnar styloid process 18.1  Across hand at thumb web space 20.2  At base of 2nd digit 6.8  (Blank rows = not tested)   LANDMARK LEFT  01/01/2022 L 05/07/22  10 cm proximal to olecranon process 36.5 37  Olecranon process 31.1 31.5  10 cm proximal to ulnar styloid process 24.2 23.5  Just proximal to ulnar styloid process 18.7 19  Across hand at thumb web space 20.8 20.3  At base of 2nd digit 6.9 6.8  (Blank rows = not tested)     TODAY'S TREATMENT: 06/05/22: Self Care Spent beginning of session discussing pts current function progress, progress towards goals and whether she would like to cont. Pt is agreeable to cont and this will be beneficial as we have had to slow her progress due to delayed wound healing  initially.  Manual Therapy PROM in supine to bilateral shoulders in to flexion, abduction, ER and IR, this is improved from last week STM to anterior shoulder where tightness limits end  IR 06/04/22: Therapeutic Exercises: Pulleys in to flexion and abduction x 2 min each with VCs to decrease bil scapular retraction Ball roll up wall into flexion x10 and Rt and then Lt abd x5 each returning therapist demo Modified Downward dog on wall 5x, 5 sec holds Supine over foam roll for bil UE horz abd, then bil UE into scaption x10 each and then "snow angel" x5 Manual Therapy PROM in supine to bilateral shoulders in to flexion, abduction, ER and IR, this is improved from last week STM to anterior shoulder where tightness limits end IR  05/29/22 Therapeutic Exercise:  Pulleys in to flexion and abduction x 2 min each Manual Therapy: PROM to bilateral shoulders in to flexion, abduction and ER with pt very limited today -see ROM table but did improve with manual therapy     PATIENT EDUCATION:  Education details: Supine scapular series (except D2) with yellow theraband Person educated: Patient Education method: Explanation and Handouts Education comprehension: verbalized understanding, returned demonstration and will benefit from further review   HOME EXERCISE PROGRAM:  Reviewed previously given post op HEP. Snow angel in supine on L  ASSESSMENT:  CLINICAL IMPRESSION: Renewal done today. Pt has made progress towards A/ROM goals but this was delayed due to delayed healing of incisions.  Will cont to progress towards unmet goals. Continued with bil shoulder P/ROM. Will wait until next week to resume supine scapular series allowing for more time for increased A/ROM to return since recently healed.   Pt will benefit from skilled therapeutic intervention to improve on the following deficits: Decreased knowledge of precautions, impaired UE functional use, pain, decreased ROM, postural dysfunction.   PT treatment/interventions: ADL/Self care home management, Therapeutic exercises, Therapeutic activity, Patient/Family education, Joint mobilization, Orthotic/Fit training, Manual lymph  drainage, Compression bandaging, scar mobilization, and Manual therapy     GOALS: Goals reviewed with patient? Yes  LONG TERM GOALS:  (STG=LTG)  GOALS Name Target Date  Goal status  1 Pt will demonstrate she has regained full shoulder ROM and function post operatively compared to baselines.  Baseline: 07/03/2022 IN PROGRESS  2 Pt will demonstrate 170 degrees of left shoulder abduction to allow her to reach out to the side and return to PLOF.  8/11//2023 Progress towards goal 149 degrees - 06/04/22  3 Pt will be independent in a home exercise program for ROM and strengthening including Strength ABC program  8/112023 Progressing towards goal; Pt is independent with initial HEP - 06/05/22  4 Pt will report a 75% improvement in feeling of tightness in L axilla with less instances of tingling in LUE to allow improved comfort. 07/03/2022 Goal met Pt reports 75% improvement at this time of Lt UE symptoms - 06/05/22     PLAN: PT FREQUENCY/DURATION: 2x/wk for 4 weeks  PLAN FOR NEXT SESSION: Renewal done this session; cont A/AA/PROM to L shoulder and R shoulder, resume supine scap once ROM returns (possibly next session) and eventually Strength ABC program   Otelia Limes, PTA 06/05/2022, 11:02 AM

## 2022-06-08 ENCOUNTER — Ambulatory Visit
Admission: RE | Admit: 2022-06-08 | Discharge: 2022-06-08 | Disposition: A | Discharge status: Home / Self Care | Payer: 59 | Source: Ambulatory Visit | Attending: Radiation Oncology | Admitting: Radiation Oncology

## 2022-06-08 ENCOUNTER — Other Ambulatory Visit: Payer: Self-pay

## 2022-06-08 DIAGNOSIS — Z510 Encounter for antineoplastic radiation therapy: Secondary | ICD-10-CM | POA: Diagnosis not present

## 2022-06-08 LAB — RAD ONC ARIA SESSION SUMMARY
Course Elapsed Days: 11
Plan Fractions Treated to Date: 8
Plan Prescribed Dose Per Fraction: 1.8 Gy
Plan Total Fractions Prescribed: 28
Plan Total Prescribed Dose: 50.4 Gy
Reference Point Dosage Given to Date: 14.4 Gy
Reference Point Session Dosage Given: 1.8 Gy
Session Number: 8

## 2022-06-08 NOTE — Addendum Note (Signed)
Addended by: Wynelle Beckmann, Hilaria Ota L on: 06/08/2022 10:14 AM   Modules accepted: Orders

## 2022-06-09 ENCOUNTER — Ambulatory Visit
Admission: RE | Admit: 2022-06-09 | Discharge: 2022-06-09 | Disposition: A | Discharge status: Home / Self Care | Payer: 59 | Source: Ambulatory Visit | Attending: Radiation Oncology | Admitting: Radiation Oncology

## 2022-06-09 ENCOUNTER — Other Ambulatory Visit: Payer: Self-pay

## 2022-06-09 ENCOUNTER — Ambulatory Visit: Payer: 59

## 2022-06-09 DIAGNOSIS — M6281 Muscle weakness (generalized): Secondary | ICD-10-CM

## 2022-06-09 DIAGNOSIS — R293 Abnormal posture: Secondary | ICD-10-CM

## 2022-06-09 DIAGNOSIS — M25612 Stiffness of left shoulder, not elsewhere classified: Secondary | ICD-10-CM | POA: Diagnosis not present

## 2022-06-09 DIAGNOSIS — C50212 Malignant neoplasm of upper-inner quadrant of left female breast: Secondary | ICD-10-CM

## 2022-06-09 DIAGNOSIS — M25611 Stiffness of right shoulder, not elsewhere classified: Secondary | ICD-10-CM

## 2022-06-09 DIAGNOSIS — Z483 Aftercare following surgery for neoplasm: Secondary | ICD-10-CM

## 2022-06-09 DIAGNOSIS — Z510 Encounter for antineoplastic radiation therapy: Secondary | ICD-10-CM | POA: Diagnosis not present

## 2022-06-09 LAB — RAD ONC ARIA SESSION SUMMARY
Course Elapsed Days: 12
Plan Fractions Treated to Date: 9
Plan Prescribed Dose Per Fraction: 1.8 Gy
Plan Total Fractions Prescribed: 28
Plan Total Prescribed Dose: 50.4 Gy
Reference Point Dosage Given to Date: 16.2 Gy
Reference Point Session Dosage Given: 1.8 Gy
Session Number: 9

## 2022-06-09 MED ORDER — RADIAPLEXRX EX GEL: Freq: Once | CUTANEOUS | Status: AC

## 2022-06-09 NOTE — Therapy (Signed)
OUTPATIENT PHYSICAL THERAPY BREAST CANCER TREATMENT NOTE   Patient Name: Maria Diaz MRN: [id number] DOB:1971-09-23, 51 y.o., female Today's Date: 06/09/2022   PT End of Session - 06/09/22 1107     Visit Number 11    Number of Visits 18    Date for PT Re-Evaluation 07/03/22    PT Start Time 1103    PT Stop Time 1159    PT Time Calculation (min) 56 min    Activity Tolerance Patient tolerated treatment well    Behavior During Therapy WFL for tasks assessed/performed             Past Medical History:  Diagnosis Date   Anemia    Anxiety    Depression    Glaucoma    Hives    chronic, on Xolair injections   Past Surgical History:  Procedure Laterality Date   BREAST BIOPSY Left 01/19/2022   BREAST CYST EXCISION Left 01/28/2021   Procedure: LEFT BREAST MASS EXCISION;  Surgeon: Rolm Bookbinder, MD;  Location: [place];  Service: General;  Laterality: Left;  START TIME OF 3:00 PM FOR 60 MINUTES IN ROOM 8   BREAST LUMPECTOMY WITH RADIOACTIVE SEED AND SENTINEL LYMPH NODE BIOPSY Left 04/13/2022   Procedure: LEFT BREAST BRACKETED LUMPECTOMY WITH RADIOACTIVE SEED AND AXILLARY SENTINEL LYMPH NODE BIOPSY;  Surgeon: Rolm Bookbinder, MD;  Location: [place];  Service: General;  Laterality: Left;   HYSTEROSCOPY W/ ENDOMETRIAL ABLATION  08/14/2020   [facility]   MASTOPEXY Bilateral 04/21/2022   Procedure: MASTOPEXY;  Surgeon: Irene Limbo, MD;  Location: [place];  Service: Plastics;  Laterality: Bilateral;   Patient Active Problem List   Diagnosis Date Noted   Anemia 12/31/2021   Malignant neoplasm of upper-inner quadrant of left breast in female, estrogen receptor positive ([place]) 12/31/2021   Open angle with borderline findings and low glaucoma risk in both eyes 01/11/2016   Chronic idiopathic urticaria 01/11/2016   Age-related nuclear cataract of both eyes 01/11/2016   Eczema 01/11/2016    PCP: [name], MD    REFERRING PROVIDER: Rolm Bookbinder, MD   REFERRING DIAG: [id number] (ICD-10-CM) - Malignant neoplasm of upper-inner quadrant of left female breast   THERAPY DIAG:  Stiffness of left shoulder, not elsewhere classified  Stiffness of right shoulder, not elsewhere classified  Muscle weakness (generalized)  Aftercare following surgery for neoplasm  Abnormal posture  Malignant neoplasm of upper-inner quadrant of left breast in female, estrogen receptor negative ([place])  Rationale for Evaluation and Treatment Rehabilitation  ONSET DATE: 12/09/21  SUBJECTIVE:  SUBJECTIVE STATEMENT: I'm doing well, nothing new since I was here last.   PERTINENT HISTORY:  Patient was diagnosed on 12/09/21 with left grade 3. It measures 3 cm and is located in the upper inner quadrant. 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 04/13/22- underwent L breast lumpectomy and SLNB (0/3) 04/21/22- bilateral mastopexy   PATIENT GOALS:  Reassess how my recovery is going related to arm function, pain, and swelling.  PAIN:  Are you having pain? No  PRECAUTIONS: Recent Surgery, left UE Lymphedema risk,   ACTIVITY LEVEL / LEISURE: been trying to do ROM exercises, pt reports she has been walking a half a mile several times recently   OBJECTIVE  COGNITION:            Overall cognitive status: Within functional limits for tasks assessed                POSTURE:  Forward head and rounded shoulders posture  OBSERVATION:  Bilateral healing mastopexy scars with inferior aspect still open bilaterally with white exudate. Right side is about 2 inch in diameter that is still open. Educated pt to make sure her doctor is aware as pt reports she still is having significant drainage from this area.    UPPER EXTREMITY  AROM/PROM:   A/PROM RIGHT  01/01/2022   R 05/29/22 06/04/22 06/09/22  Shoulder extension 61 41 61   Shoulder flexion 164 132 151 162  Shoulder abduction 173 159 175 172  Shoulder internal rotation 55 58 63 64  Shoulder external rotation 90 80 82                           (Blank rows = not tested)   A/PROM LEFT  01/01/2022 L 05/07/22 05/21/22 05/29/22 06/04/22 06/09/22  Shoulder extension 63 59 60 41 39 43  Shoulder flexion 165 165 165 141 149 157  Shoulder abduction 180 145 165 139 154 170  Shoulder internal rotation 68 71  61 59 43  Shoulder external rotation 88 80 88 65 90                           (Blank rows = not tested)     LYMPHEDEMA ASSESSMENTS:    LANDMARK RIGHT  01/01/2022  10 cm proximal to olecranon process 36.5  Olecranon process 30.5  10 cm proximal to ulnar styloid process 23.5  Just proximal to ulnar styloid process 18.1  Across hand at thumb web space 20.2  At base of 2nd digit 6.8  (Blank rows = not tested)   LANDMARK LEFT  01/01/2022 L 05/07/22  10 cm proximal to olecranon process 36.5 37  Olecranon process 31.1 31.5  10 cm proximal to ulnar styloid process 24.2 23.5  Just proximal to ulnar styloid process 18.7 19  Across hand at thumb web space 20.8 20.3  At base of 2nd digit 6.9 6.8  (Blank rows = not tested)     TODAY'S TREATMENT: 06/09/22: Therapeutic Exercises: Pulleys in to flexion and abduction x 2 min each  Ball roll up wall into flexion x10 and Rt and then Lt abd x5 each  Resumed Supine scapular series with yellow theraband as pts A/ROM has returned though advised her to only perform these for now 2x/wk, and 5 reps each as we did in clinic today Manual Therapy PROM in supine to bilateral shoulders in to flexion, abduction, and IR;  her Lt IR was near normal by end of stretching STM to anterior shoulders where tightness limits end IR  06/05/22: Self Care Spent beginning of session discussing pts current function progress, progress towards goals and whether  she would like to cont. Pt is agreeable to cont and this will be beneficial as we have had to slow her progress due to delayed wound healing initially.  Manual Therapy PROM in supine to bilateral shoulders in to flexion, abduction, ER and IR, this is improved from last week STM to anterior shoulder where tightness limits end IR  06/04/22: Therapeutic Exercises: Pulleys in to flexion and abduction x 2 min each with VCs to decrease bil scapular retraction Ball roll up wall into flexion x10 and Rt and then Lt abd x5 each returning therapist demo Modified Downward dog on wall 5x, 5 sec holds Supine over foam roll for bil UE horz abd, then bil UE into scaption x10 each and then "snow angel" x5 Manual Therapy PROM in supine to bilateral shoulders in to flexion, abduction, ER and IR, this is improved from last week STM to anterior shoulder where tightness limits end IR     PATIENT EDUCATION:  Education details: Supine scapular series (except D2) with yellow theraband Person educated: Patient Education method: Explanation and Handouts Education comprehension: verbalized understanding, returned demonstration and will benefit from further review   HOME EXERCISE PROGRAM:  Reviewed previously given post op HEP. Snow angel in supine on L  ASSESSMENT:  CLINICAL IMPRESSION: Continued with AA/ROM for end range stretching. Then also resumed supine scapular series as most of pts A/ROM has returned to normal. Most limitations are with bil IR so spent some time focusing here with manual therapy along with continuing overall bil shoulder P/ROM and STM.   Pt will benefit from skilled therapeutic intervention to improve on the following deficits: Decreased knowledge of precautions, impaired UE functional use, pain, decreased ROM, postural dysfunction.   PT treatment/interventions: ADL/Self care home management, Therapeutic exercises, Therapeutic activity, Patient/Family education, Joint mobilization,  Orthotic/Fit training, Manual lymph drainage, Compression bandaging, scar mobilization, and Manual therapy     GOALS: Goals reviewed with patient? Yes  LONG TERM GOALS:  (STG=LTG)  GOALS Name Target Date  Goal status  1 Pt will demonstrate she has regained full shoulder ROM and function post operatively compared to baselines.  Baseline: 07/03/2022 IN PROGRESS  2 Pt will demonstrate 170 degrees of left shoulder abduction to allow her to reach out to the side and return to PLOF.  8/11//2023 Progress towards goal 149 degrees - 06/04/22  3 Pt will be independent in a home exercise program for ROM and strengthening including Strength ABC program  8/112023 Progressing towards goal; Pt is independent with initial HEP - 06/05/22  4 Pt will report a 75% improvement in feeling of tightness in L axilla with less instances of tingling in LUE to allow improved comfort. 07/03/2022 Goal met Pt reports 75% improvement at this time of Lt UE symptoms - 06/05/22     PLAN: PT FREQUENCY/DURATION: 2x/wk for 4 weeks  PLAN FOR NEXT SESSION: Cont A/AA/PROM to L shoulder and R shoulder, supine scap (pt only doing these 2x/wk for now and 5 reps) and eventually Strength ABC program   Otelia Limes, PTA 06/09/2022, 12:05 PM

## 2022-06-10 ENCOUNTER — Other Ambulatory Visit: Payer: Self-pay

## 2022-06-10 ENCOUNTER — Ambulatory Visit
Admission: RE | Admit: 2022-06-10 | Discharge: 2022-06-10 | Disposition: A | Discharge status: Home / Self Care | Payer: 59 | Source: Ambulatory Visit | Attending: Radiation Oncology | Admitting: Radiation Oncology

## 2022-06-10 DIAGNOSIS — Z510 Encounter for antineoplastic radiation therapy: Secondary | ICD-10-CM | POA: Diagnosis not present

## 2022-06-10 LAB — RAD ONC ARIA SESSION SUMMARY
Course Elapsed Days: 13
Plan Fractions Treated to Date: 10
Plan Prescribed Dose Per Fraction: 1.8 Gy
Plan Total Fractions Prescribed: 28
Plan Total Prescribed Dose: 50.4 Gy
Reference Point Dosage Given to Date: 18 Gy
Reference Point Session Dosage Given: 1.8 Gy
Session Number: 10

## 2022-06-11 ENCOUNTER — Other Ambulatory Visit: Payer: Self-pay

## 2022-06-11 ENCOUNTER — Ambulatory Visit
Admission: RE | Admit: 2022-06-11 | Discharge: 2022-06-11 | Disposition: A | Discharge status: Home / Self Care | Payer: 59 | Source: Ambulatory Visit | Attending: Radiation Oncology | Admitting: Radiation Oncology

## 2022-06-11 ENCOUNTER — Telehealth: Payer: Self-pay

## 2022-06-11 DIAGNOSIS — Z510 Encounter for antineoplastic radiation therapy: Secondary | ICD-10-CM | POA: Diagnosis not present

## 2022-06-11 LAB — RAD ONC ARIA SESSION SUMMARY
Course Elapsed Days: 14
Plan Fractions Treated to Date: 11
Plan Prescribed Dose Per Fraction: 1.8 Gy
Plan Total Fractions Prescribed: 28
Plan Total Prescribed Dose: 50.4 Gy
Reference Point Dosage Given to Date: 19.8 Gy
Reference Point Session Dosage Given: 1.8 Gy
Session Number: 11

## 2022-06-11 NOTE — Telephone Encounter (Signed)
Notified Patient of completion of FMLA Forms. Fax transmission confirmation received. Copy of forms placed for pick-up as requested by Patient. No other needs or concerns voiced at this time.

## 2022-06-12 ENCOUNTER — Other Ambulatory Visit: Payer: Self-pay

## 2022-06-12 ENCOUNTER — Ambulatory Visit
Admission: RE | Admit: 2022-06-12 | Discharge: 2022-06-12 | Disposition: A | Discharge status: Home / Self Care | Payer: 59 | Source: Ambulatory Visit | Attending: Radiation Oncology | Admitting: Radiation Oncology

## 2022-06-12 ENCOUNTER — Ambulatory Visit: Payer: 59 | Admitting: Physical Therapy

## 2022-06-12 DIAGNOSIS — M25612 Stiffness of left shoulder, not elsewhere classified: Secondary | ICD-10-CM | POA: Diagnosis not present

## 2022-06-12 DIAGNOSIS — R293 Abnormal posture: Secondary | ICD-10-CM

## 2022-06-12 DIAGNOSIS — M6281 Muscle weakness (generalized): Secondary | ICD-10-CM

## 2022-06-12 DIAGNOSIS — Z483 Aftercare following surgery for neoplasm: Secondary | ICD-10-CM

## 2022-06-12 DIAGNOSIS — Z510 Encounter for antineoplastic radiation therapy: Secondary | ICD-10-CM | POA: Diagnosis not present

## 2022-06-12 DIAGNOSIS — C50212 Malignant neoplasm of upper-inner quadrant of left female breast: Secondary | ICD-10-CM

## 2022-06-12 DIAGNOSIS — M25611 Stiffness of right shoulder, not elsewhere classified: Secondary | ICD-10-CM

## 2022-06-12 LAB — RAD ONC ARIA SESSION SUMMARY
Course Elapsed Days: 15
Plan Fractions Treated to Date: 12
Plan Prescribed Dose Per Fraction: 1.8 Gy
Plan Total Fractions Prescribed: 28
Plan Total Prescribed Dose: 50.4 Gy
Reference Point Dosage Given to Date: 21.6 Gy
Reference Point Session Dosage Given: 1.8 Gy
Session Number: 12

## 2022-06-12 NOTE — Therapy (Signed)
OUTPATIENT PHYSICAL THERAPY BREAST CANCER TREATMENT NOTE   Patient Name: Maria Diaz MRN: [id number] DOB:[birth year], 51 y.o., female Today's Date: 06/12/2022   PT End of Session - 06/12/22 1055     Visit Number 12    Number of Visits 18    Date for PT Re-Evaluation 07/03/22    PT Start Time 1000    PT Stop Time 1045    PT Time Calculation (min) 45 min    Activity Tolerance Patient tolerated treatment well    Behavior During Therapy WFL for tasks assessed/performed              Past Medical History:  Diagnosis Date   Anemia    Anxiety    Depression    Glaucoma    Hives    chronic, on Xolair injections   Past Surgical History:  Procedure Laterality Date   BREAST BIOPSY Left 01/19/2022   BREAST CYST EXCISION Left 01/28/2021   Procedure: LEFT BREAST MASS EXCISION;  Surgeon: Rolm Bookbinder, MD;  Location: [place];  Service: General;  Laterality: Left;  START TIME OF 3:00 PM FOR 60 MINUTES IN ROOM 8   BREAST LUMPECTOMY WITH RADIOACTIVE SEED AND SENTINEL LYMPH NODE BIOPSY Left 04/13/2022   Procedure: LEFT BREAST BRACKETED LUMPECTOMY WITH RADIOACTIVE SEED AND AXILLARY SENTINEL LYMPH NODE BIOPSY;  Surgeon: Rolm Bookbinder, MD;  Location: [place];  Service: General;  Laterality: Left;   HYSTEROSCOPY W/ ENDOMETRIAL ABLATION  08/14/2020   [facility]   MASTOPEXY Bilateral 04/21/2022   Procedure: MASTOPEXY;  Surgeon: Irene Limbo, MD;  Location: [place];  Service: Plastics;  Laterality: Bilateral;   Patient Active Problem List   Diagnosis Date Noted   Anemia 12/31/2021   Malignant neoplasm of upper-inner quadrant of left breast in female, estrogen receptor positive ([place]) 12/31/2021   Open angle with borderline findings and low glaucoma risk in both eyes 01/11/2016   Chronic idiopathic urticaria 01/11/2016   Age-related nuclear cataract of both eyes 01/11/2016   Eczema 01/11/2016    PCP: [name], MD    REFERRING PROVIDER: Rolm Bookbinder, MD   REFERRING DIAG: [phone] (ICD-10-CM) - Malignant neoplasm of upper-inner quadrant of left female breast   THERAPY DIAG:  Stiffness of left shoulder, not elsewhere classified  Stiffness of right shoulder, not elsewhere classified  Muscle weakness (generalized)  Aftercare following surgery for neoplasm  Abnormal posture  Malignant neoplasm of upper-inner quadrant of left breast in female, estrogen receptor negative ([place])  Rationale for Evaluation and Treatment Rehabilitation  ONSET DATE: 12/09/21  SUBJECTIVE:  SUBJECTIVE STATEMENT: Pt states she just finished up #11 of 33 radiation treatments.  She said her skin is holding out, but she has fatigue after her radiation and PT, but she feels better after a nap   PERTINENT HISTORY:  Patient was diagnosed on 12/09/21 with left grade 3. It measures 3 cm and is located in the upper inner quadrant. 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 04/13/22- underwent L breast lumpectomy and SLNB (0/3) 04/21/22- bilateral mastopexy   PATIENT GOALS:  Reassess how my recovery is going related to arm function, pain, and swelling.  PAIN:  Are you having pain? No  PRECAUTIONS: Recent Surgery, left UE Lymphedema risk,   ACTIVITY LEVEL / LEISURE: been trying to do ROM exercises, pt reports she has been walking a half a mile several times recently   OBJECTIVE  COGNITION:            Overall cognitive status: Within functional limits for tasks assessed                POSTURE:  Forward head and rounded shoulders posture  OBSERVATION:  Bilateral healing mastopexy scars with both sides healed with not drainage. Pink scar visible on right side.  Pt has redness on inferior aspect of both breasts.  Educated to  monitor this as radiation progresses as friction from bra band may become problematic   UPPER EXTREMITY AROM/PROM:   A/PROM RIGHT  01/01/2022   R 05/29/22 06/04/22 06/09/22  Shoulder extension 61 41 61   Shoulder flexion 164 132 151 162  Shoulder abduction 173 159 175 172  Shoulder internal rotation 55 58 63 64  Shoulder external rotation 90 80 82                           (Blank rows = not tested)   A/PROM LEFT  01/01/2022 L 05/07/22 05/21/22 05/29/22 06/04/22 06/09/22  Shoulder extension 63 59 60 41 39 43  Shoulder flexion 165 165 165 141 149 157  Shoulder abduction 180 145 165 139 154 170  Shoulder internal rotation 68 71  61 59 43  Shoulder external rotation 88 80 88 65 90                           (Blank rows = not tested)     LYMPHEDEMA ASSESSMENTS:    LANDMARK RIGHT  01/01/2022  10 cm proximal to olecranon process 36.5  Olecranon process 30.5  10 cm proximal to ulnar styloid process 23.5  Just proximal to ulnar styloid process 18.1  Across hand at thumb web space 20.2  At base of 2nd digit 6.8  (Blank rows = not tested)   LANDMARK LEFT  01/01/2022 L 05/07/22  10 cm proximal to olecranon process 36.5 37  Olecranon process 31.1 31.5  10 cm proximal to ulnar styloid process 24.2 23.5  Just proximal to ulnar styloid process 18.7 19  Across hand at thumb web space 20.8 20.3  At base of 2nd digit 6.9 6.8  (Blank rows = not tested)     TODAY'S TREATMENT: 06/12/22: Therapeutic exercise : Began with neck and upper thoracic ROM with bra removed to observe pulling on scar. Supine core exercise: pelvic anterior/posterior / and lateral titls on mat and with purple ball under sacum x ~ 15 reps. Pt with bent knee lift with core engaged x 5 reps each side.  Pt  with central bulging at abdomen from possible rectus diastasis and was able to reduce this with active abdominal activity  Supine shoulder ROM bilaterally.  Pt with some pulling at chest and numbness in fingers with diagonal elevation on   both sides.Pt cautioned to do just take to her pain limit and do a deep breath, not to overstretch into this to protect from traction on radiated skin Sidelying: bilateral UE and LEx 5 reps: hand to ceilining with 5 reps in each direction, shoulder abduction to 90 degrees. Shoulder flexion to pain end range, towel at waist for external rotation with elbow bent 1# in hand 2 sets of 5. LE straight leg and bent leg abduction, clams Functional test: pt able to do 10 reps of sit to stand in 30 seconds with RPE of 7/10 instruction to do 3-5 reps at home, some with "power up" component, and to be active at least every 2 hours during the day     06/09/22: Therapeutic Exercises: Pulleys in to flexion and abduction x 2 min each  Ball roll up wall into flexion x10 and Rt and then Lt abd x5 each  Resumed Supine scapular series with yellow theraband as pts A/ROM has returned though advised her to only perform these for now 2x/wk, and 5 reps each as we did in clinic today Manual Therapy PROM in supine to bilateral shoulders in to flexion, abduction, and IR; her Lt IR was near normal by end of stretching STM to anterior shoulders where tightness limits end IR  06/05/22: Self Care Spent beginning of session discussing pts current function progress, progress towards goals and whether she would like to cont. Pt is agreeable to cont and this will be beneficial as we have had to slow her progress due to delayed wound healing initially.  Manual Therapy PROM in supine to bilateral shoulders in to flexion, abduction, ER and IR, this is improved from last week STM to anterior shoulder where tightness limits end IR  06/04/22: Therapeutic Exercises: Pulleys in to flexion and abduction x 2 min each with VCs to decrease bil scapular retraction Ball roll up wall into flexion x10 and Rt and then Lt abd x5 each returning therapist demo Modified Downward dog on wall 5x, 5 sec holds Supine over foam roll for bil UE horz abd,  then bil UE into scaption x10 each and then "snow angel" x5 Manual Therapy PROM in supine to bilateral shoulders in to flexion, abduction, ER and IR, this is improved from last week STM to anterior shoulder where tightness limits end IR     PATIENT EDUCATION:  Education details: Supine scapular series (except D2) with yellow theraband Person educated: Patient Education method: Explanation and Handouts Education comprehension: verbalized understanding, returned demonstration and will benefit from further review   HOME EXERCISE PROGRAM:  Reviewed previously given post op HEP. Snow angel in supine on L  ASSESSMENT:  CLINICAL IMPRESSION: Pt is doing well with shoulder ROM thought she still has some pulling and tingling in diagonal end range.  Educated to monitor her skin as radiation progresses. Focused on core and LE exercise in addition to UE work today and set goal for functional sit to stand. Feel she will benefit from continues general exercise and stregthening to help mitigate cancer rated fatigue and llow her to continue her treatment.    Pt will benefit from skilled therapeutic intervention to improve on the following deficits: Decreased knowledge of precautions, impaired UE functional use, pain, decreased ROM, postural dysfunction.  PT treatment/interventions: ADL/Self care home management, Therapeutic exercises, Therapeutic activity, Patient/Family education, Joint mobilization, Orthotic/Fit training, Manual lymph drainage, Compression bandaging, scar mobilization, and Manual therapy     GOALS: Goals reviewed with patient? Yes  LONG TERM GOALS:  (STG=LTG)  GOALS Name Target Date  Goal status  1 Pt will demonstrate she has regained full shoulder ROM and function post operatively compared to baselines.  Baseline: 07/03/2022 IN PROGRESS  2 Pt will demonstrate 170 degrees of left shoulder abduction to allow her to reach out to the side and return to PLOF.  8/11//2023  Progress towards goal 149 degrees - 06/04/22  3 Pt will be independent in a home exercise program for ROM and strengthening including Strength ABC program  8/112023 Progressing towards goal; Pt is independent with initial HEP - 06/05/22  4 Pt will report a 75% improvement in feeling of tightness in L axilla with less instances of tingling in LUE to allow improved comfort. 07/03/2022 Goal met Pt reports 75% improvement at this time of Lt UE symptoms - 06/05/22  5 Pt will do 13 reps of sit to stand in 30 seconds with RPE of 4/10   Baseline: 10 reps with RPE of 7/10  07/03/2022 New     PLAN: PT FREQUENCY/DURATION: 2x/wk for 4 weeks  PLAN FOR NEXT SESSION: Cont A/AA/PROM to L shoulder and R shoulder, supine scap (pt only doing these 2x/wk for now and 5 reps) and eventually Strength ABC program continue and progress core and LE strengthening   Helene Kelp K. Owens Shark PT  Norwood Levo, PT 06/12/2022, 10:56 AM

## 2022-06-15 ENCOUNTER — Other Ambulatory Visit: Payer: Self-pay

## 2022-06-15 ENCOUNTER — Ambulatory Visit
Admission: RE | Admit: 2022-06-15 | Discharge: 2022-06-15 | Disposition: A | Discharge status: Home / Self Care | Payer: 59 | Source: Ambulatory Visit | Attending: Radiation Oncology | Admitting: Radiation Oncology

## 2022-06-15 DIAGNOSIS — Z510 Encounter for antineoplastic radiation therapy: Secondary | ICD-10-CM | POA: Diagnosis not present

## 2022-06-15 LAB — RAD ONC ARIA SESSION SUMMARY
Course Elapsed Days: 18
Plan Fractions Treated to Date: 13
Plan Prescribed Dose Per Fraction: 1.8 Gy
Plan Total Fractions Prescribed: 28
Plan Total Prescribed Dose: 50.4 Gy
Reference Point Dosage Given to Date: 23.4 Gy
Reference Point Session Dosage Given: 1.8 Gy
Session Number: 13

## 2022-06-16 ENCOUNTER — Ambulatory Visit: Payer: 59 | Admitting: Rehabilitation

## 2022-06-16 ENCOUNTER — Ambulatory Visit: Payer: 59

## 2022-06-16 ENCOUNTER — Other Ambulatory Visit: Payer: Self-pay

## 2022-06-16 ENCOUNTER — Ambulatory Visit
Admission: RE | Admit: 2022-06-16 | Discharge: 2022-06-16 | Disposition: A | Discharge status: Home / Self Care | Payer: 59 | Source: Ambulatory Visit | Attending: Radiation Oncology | Admitting: Radiation Oncology

## 2022-06-16 ENCOUNTER — Encounter: Payer: Self-pay | Admitting: Rehabilitation

## 2022-06-16 DIAGNOSIS — Z510 Encounter for antineoplastic radiation therapy: Secondary | ICD-10-CM | POA: Diagnosis not present

## 2022-06-16 DIAGNOSIS — M6281 Muscle weakness (generalized): Secondary | ICD-10-CM

## 2022-06-16 DIAGNOSIS — M25611 Stiffness of right shoulder, not elsewhere classified: Secondary | ICD-10-CM

## 2022-06-16 DIAGNOSIS — Z483 Aftercare following surgery for neoplasm: Secondary | ICD-10-CM

## 2022-06-16 DIAGNOSIS — M25612 Stiffness of left shoulder, not elsewhere classified: Secondary | ICD-10-CM

## 2022-06-16 DIAGNOSIS — Z170 Estrogen receptor positive status [ER+]: Secondary | ICD-10-CM

## 2022-06-16 DIAGNOSIS — R293 Abnormal posture: Secondary | ICD-10-CM

## 2022-06-16 LAB — RAD ONC ARIA SESSION SUMMARY
Course Elapsed Days: 19
Plan Fractions Treated to Date: 14
Plan Prescribed Dose Per Fraction: 1.8 Gy
Plan Total Fractions Prescribed: 28
Plan Total Prescribed Dose: 50.4 Gy
Reference Point Dosage Given to Date: 25.2 Gy
Reference Point Session Dosage Given: 1.8 Gy
Session Number: 14

## 2022-06-16 NOTE — Therapy (Signed)
OUTPATIENT PHYSICAL THERAPY BREAST CANCER TREATMENT NOTE   Patient Name: Maria Diaz MRN: [id number] DOB:03-Sep-1971, 51 y.o., female Today's Date: 06/16/2022   PT End of Session - 06/16/22 0907     Visit Number 13    Number of Visits 18    Date for PT Re-Evaluation 07/03/22    PT Start Time 0907    PT Stop Time 0943    PT Time Calculation (min) 36 min    Activity Tolerance Patient tolerated treatment well    Behavior During Therapy [facility] for tasks assessed/performed               Past Medical History:  Diagnosis Date   Anemia    Anxiety    Depression    Glaucoma    Hives    chronic, on Xolair injections   Past Surgical History:  Procedure Laterality Date   BREAST BIOPSY Left 01/19/2022   BREAST CYST EXCISION Left 01/28/2021   Procedure: LEFT BREAST MASS EXCISION;  Surgeon: Rolm Bookbinder, MD;  Location: [place];  Service: General;  Laterality: Left;  START TIME OF 3:00 PM FOR 60 MINUTES IN ROOM 8   BREAST LUMPECTOMY WITH RADIOACTIVE SEED AND SENTINEL LYMPH NODE BIOPSY Left 04/13/2022   Procedure: LEFT BREAST BRACKETED LUMPECTOMY WITH RADIOACTIVE SEED AND AXILLARY SENTINEL LYMPH NODE BIOPSY;  Surgeon: Rolm Bookbinder, MD;  Location: [place];  Service: General;  Laterality: Left;   HYSTEROSCOPY W/ ENDOMETRIAL ABLATION  08/14/2020   [facility]   MASTOPEXY Bilateral 04/21/2022   Procedure: MASTOPEXY;  Surgeon: Irene Limbo, MD;  Location: [place];  Service: Plastics;  Laterality: Bilateral;   Patient Active Problem List   Diagnosis Date Noted   Anemia 12/31/2021   Malignant neoplasm of upper-inner quadrant of left breast in female, estrogen receptor positive ([place]) 12/31/2021   Open angle with borderline findings and low glaucoma risk in both eyes 01/11/2016   Chronic idiopathic urticaria 01/11/2016   Age-related nuclear cataract of both eyes 01/11/2016   Eczema 01/11/2016    PCP: [name], MD    REFERRING PROVIDER: Rolm Bookbinder, MD   REFERRING DIAG: [id number] (ICD-10-CM) - Malignant neoplasm of upper-inner quadrant of left female breast   THERAPY DIAG:  Stiffness of left shoulder, not elsewhere classified  Malignant neoplasm of upper-inner quadrant of left breast in female, estrogen receptor positive ([place])  Stiffness of right shoulder, not elsewhere classified  Muscle weakness (generalized)  Aftercare following surgery for neoplasm  Abnormal posture  Rationale for Evaluation and Treatment Rehabilitation  ONSET DATE: 12/09/21  SUBJECTIVE:  SUBJECTIVE STATEMENT:  I am tired.  I am starting to get swollen.    PERTINENT HISTORY:  Patient was diagnosed on 12/09/21 with left grade 3. It measures 3 cm and is located in the upper inner quadrant. 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 04/13/22- underwent L breast lumpectomy and SLNB (0/3) 04/21/22- bilateral mastopexy   PATIENT GOALS:  Reassess how my recovery is going related to arm function, pain, and swelling.  PAIN:  Are you having pain? 4/10 breast from radiation   PRECAUTIONS: Recent Surgery, left UE Lymphedema risk,   OBJECTIVE  OBSERVATION: Bilateral healing mastopexy scars with both sides healed with not drainage. Pink scar visible on right side.  Pt has redness on inferior aspect of both breasts.  Educated to monitor this as radiation progresses as friction from bra band may become problematic    UPPER EXTREMITY AROM/PROM:   A/PROM RIGHT  01/01/2022   R 05/29/22 06/04/22 06/09/22  Shoulder extension 61 41 61   Shoulder flexion 164 132 151 162  Shoulder abduction 173 159 175 172  Shoulder internal rotation 55 58 63 64  Shoulder external rotation 90 80 82                           (Blank rows = not tested)    A/PROM LEFT  01/01/2022 L 05/07/22 05/21/22 05/29/22 06/04/22 06/09/22  Shoulder extension 63 59 60 41 39 43  Shoulder flexion 165 165 165 141 149 157  Shoulder abduction 180 145 165 139 154 170  Shoulder internal rotation 68 71  61 59 43  Shoulder external rotation 88 80 88 65 90                           (Blank rows = not tested)     LYMPHEDEMA ASSESSMENTS:    LANDMARK RIGHT  01/01/2022  10 cm proximal to olecranon process 36.5  Olecranon process 30.5  10 cm proximal to ulnar styloid process 23.5  Just proximal to ulnar styloid process 18.1  Across hand at thumb web space 20.2  At base of 2nd digit 6.8  (Blank rows = not tested)   LANDMARK LEFT  01/01/2022 L 05/07/22  10 cm proximal to olecranon process 36.5 37  Olecranon process 31.1 31.5  10 cm proximal to ulnar styloid process 24.2 23.5  Just proximal to ulnar styloid process 18.7 19  Across hand at thumb web space 20.8 20.3  At base of 2nd digit 6.9 6.8  (Blank rows = not tested)     TODAY'S TREATMENT: 06/16/22 Exercises - Supine Posterior Pelvic Tilt  - 10 reps - 2-3 second hold - Hooklying Rib Cage Breathing  - 10 reps - Supine Cervical Rotation AROM on Flat Ball  - 3 reps  - Supine Lower Trapezius Strengthening  - 5 reps - Supine Bilateral Shoulder Protraction  - 10 reps - Pilates Bridge - 5 reps - Supine Transversus Abdominis Bracing with Heel Slide  - 5 reps - Supine March with Posterior Pelvic Tilt  - 5 reps - Supine Shoulder Flexion with Dowel  -  5 reps - Arms Scissors  - 10 reps - Hooklying Shoulder T  - 10 reps - Mermaid seated  - 3 reps -Sit to stand:  x 10  -sidelying ER bil 1# 2x5, clams bil 2x5  -standing scaption alternating with flexion 1# 2x5 bil -pulleys 49mn flexion  and abduction  06/12/22: Therapeutic exercise : Began with neck and upper thoracic ROM with bra removed to observe pulling on scar. Supine core exercise: pelvic anterior/posterior / and lateral titls on mat and with purple ball under sacum  x ~ 15 reps. Pt with bent knee lift with core engaged x 5 reps each side.  Pt with central bulging at abdomen from possible rectus diastasis and was able to reduce this with active abdominal activity  Supine shoulder ROM bilaterally.  Pt with some pulling at chest and numbness in fingers with diagonal elevation on  both sides.Pt cautioned to do just take to her pain limit and do a deep breath, not to overstretch into this to protect from traction on radiated skin Sidelying: bilateral UE and LEx 5 reps: hand to ceilining with 5 reps in each direction, shoulder abduction to 90 degrees. Shoulder flexion to pain end range, towel at waist for external rotation with elbow bent 1# in hand 2 sets of 5. LE straight leg and bent leg abduction, clams Functional test: pt able to do 10 reps of sit to stand in 30 seconds with RPE of 7/10 instruction to do 3-5 reps at home, some with "power up" component, and to be active at least every 2 hours during the day    06/09/22: Therapeutic Exercises: Pulleys in to flexion and abduction x 2 min each  Ball roll up wall into flexion x10 and Rt and then Lt abd x5 each  Resumed Supine scapular series with yellow theraband as pts A/ROM has returned though advised her to only perform these for now 2x/wk, and 5 reps each as we did in clinic today Manual Therapy PROM in supine to bilateral shoulders in to flexion, abduction, and IR; her Lt IR was near normal by end of stretching STM to anterior shoulders where tightness limits end IR  PATIENT EDUCATION:  Education details: Supine scapular series (except D2) with yellow theraband Person educated: Patient Education method: Explanation and Handouts Education comprehension: verbalized understanding, returned demonstration and will benefit from further review  HOME EXERCISE PROGRAM:  Reviewed previously given post op HEP. Snow angel in supine on L  ASSESSMENT: CLINICAL IMPRESSION: Continued with pilates/gentle based TE today.   Pt is starting to have some pain in the breast, edema, and redness.  Discussed how she may feel like she needs to take a break soon and focus on healing.  Pt did not require PROM due to excellent ROM today.   Pt will benefit from skilled therapeutic intervention to improve on the following deficits: Decreased knowledge of precautions, impaired UE functional use, pain, decreased ROM, postural dysfunction.   PT treatment/interventions: ADL/Self care home management, Therapeutic exercises, Therapeutic activity, Patient/Family education, Joint mobilization, Orthotic/Fit training, Manual lymph drainage, Compression bandaging, scar mobilization, and Manual therapy     GOALS: Goals reviewed with patient? Yes  LONG TERM GOALS:  (STG=LTG)  GOALS Name Target Date  Goal status  1 Pt will demonstrate she has regained full shoulder ROM and function post operatively compared to baselines.  Baseline: 07/03/2022 IN PROGRESS  2 Pt will demonstrate 170 degrees of left shoulder abduction to allow her to reach out to the side and return to PLOF.  8/11//2023 Progress towards goal 149 degrees - 06/04/22  3 Pt will be independent in a home exercise program for ROM and strengthening including Strength ABC program  8/112023 Progressing towards goal; Pt is independent with initial HEP - 06/05/22  4 Pt will report a 75% improvement  in feeling of tightness in L axilla with less instances of tingling in LUE to allow improved comfort. 07/03/2022 Goal met Pt reports 75% improvement at this time of Lt UE symptoms - 06/05/22  5 Pt will do 13 reps of sit to stand in 30 seconds with RPE of 4/10   Baseline: 10 reps with RPE of 7/10  07/03/2022 New     PLAN: PT FREQUENCY/DURATION: 2x/wk for 4 weeks  PLAN FOR NEXT SESSION: Cont A/AA/PROM to L shoulder and R shoulder, supine scap (pt only doing these 2x/wk for now and 5 reps) and eventually Strength ABC program continue and progress core and LE strengthening   Helene Kelp K.  Owens Shark, PT  Stark Bray, PT 06/16/2022, 9:43 AM

## 2022-06-17 ENCOUNTER — Other Ambulatory Visit: Payer: Self-pay

## 2022-06-17 ENCOUNTER — Ambulatory Visit
Admission: RE | Admit: 2022-06-17 | Discharge: 2022-06-17 | Disposition: A | Discharge status: Home / Self Care | Payer: 59 | Source: Ambulatory Visit | Attending: Radiation Oncology | Admitting: Radiation Oncology

## 2022-06-17 DIAGNOSIS — Z510 Encounter for antineoplastic radiation therapy: Secondary | ICD-10-CM | POA: Diagnosis not present

## 2022-06-17 LAB — RAD ONC ARIA SESSION SUMMARY
Course Elapsed Days: 20
Plan Fractions Treated to Date: 15
Plan Prescribed Dose Per Fraction: 1.8 Gy
Plan Total Fractions Prescribed: 28
Plan Total Prescribed Dose: 50.4 Gy
Reference Point Dosage Given to Date: 27 Gy
Reference Point Session Dosage Given: 1.8 Gy
Session Number: 15

## 2022-06-18 ENCOUNTER — Ambulatory Visit
Admission: RE | Admit: 2022-06-18 | Discharge: 2022-06-18 | Disposition: A | Discharge status: Home / Self Care | Payer: 59 | Source: Ambulatory Visit | Attending: Radiation Oncology | Admitting: Radiation Oncology

## 2022-06-18 ENCOUNTER — Other Ambulatory Visit: Payer: Self-pay

## 2022-06-18 DIAGNOSIS — Z510 Encounter for antineoplastic radiation therapy: Secondary | ICD-10-CM | POA: Diagnosis not present

## 2022-06-18 LAB — RAD ONC ARIA SESSION SUMMARY
Course Elapsed Days: 21
Plan Fractions Treated to Date: 16
Plan Prescribed Dose Per Fraction: 1.8 Gy
Plan Total Fractions Prescribed: 28
Plan Total Prescribed Dose: 50.4 Gy
Reference Point Dosage Given to Date: 28.8 Gy
Reference Point Session Dosage Given: 1.8 Gy
Session Number: 16

## 2022-06-19 ENCOUNTER — Ambulatory Visit: Payer: 59 | Admitting: Rehabilitation

## 2022-06-19 ENCOUNTER — Encounter: Payer: Self-pay | Admitting: Hematology and Oncology

## 2022-06-19 ENCOUNTER — Inpatient Hospital Stay: Payer: 59 | Attending: Hematology and Oncology | Admitting: Hematology and Oncology

## 2022-06-19 ENCOUNTER — Encounter: Payer: Self-pay | Admitting: Rehabilitation

## 2022-06-19 ENCOUNTER — Other Ambulatory Visit: Payer: Self-pay | Admitting: Psychiatry

## 2022-06-19 ENCOUNTER — Other Ambulatory Visit: Payer: Self-pay

## 2022-06-19 ENCOUNTER — Ambulatory Visit
Admission: RE | Admit: 2022-06-19 | Discharge: 2022-06-19 | Disposition: A | Discharge status: Home / Self Care | Payer: 59 | Source: Ambulatory Visit | Attending: Radiation Oncology | Admitting: Radiation Oncology

## 2022-06-19 ENCOUNTER — Telehealth: Payer: Self-pay

## 2022-06-19 DIAGNOSIS — R293 Abnormal posture: Secondary | ICD-10-CM

## 2022-06-19 DIAGNOSIS — Z170 Estrogen receptor positive status [ER+]: Secondary | ICD-10-CM

## 2022-06-19 DIAGNOSIS — Z510 Encounter for antineoplastic radiation therapy: Secondary | ICD-10-CM | POA: Diagnosis not present

## 2022-06-19 DIAGNOSIS — M6281 Muscle weakness (generalized): Secondary | ICD-10-CM

## 2022-06-19 DIAGNOSIS — M25611 Stiffness of right shoulder, not elsewhere classified: Secondary | ICD-10-CM

## 2022-06-19 DIAGNOSIS — M25612 Stiffness of left shoulder, not elsewhere classified: Secondary | ICD-10-CM | POA: Diagnosis not present

## 2022-06-19 DIAGNOSIS — Z483 Aftercare following surgery for neoplasm: Secondary | ICD-10-CM

## 2022-06-19 DIAGNOSIS — C50212 Malignant neoplasm of upper-inner quadrant of left female breast: Secondary | ICD-10-CM

## 2022-06-19 LAB — RAD ONC ARIA SESSION SUMMARY
Course Elapsed Days: 22
Plan Fractions Treated to Date: 17
Plan Prescribed Dose Per Fraction: 1.8 Gy
Plan Total Fractions Prescribed: 28
Plan Total Prescribed Dose: 50.4 Gy
Reference Point Dosage Given to Date: 30.6 Gy
Reference Point Session Dosage Given: 1.8 Gy
Session Number: 17

## 2022-06-19 MED ORDER — TRAZODONE HCL 50 MG PO TABS: 25.0000 mg | ORAL_TABLET | Freq: Every evening | ORAL | 0 refills | Status: DC | PRN

## 2022-06-19 MED ORDER — TAMOXIFEN CITRATE 20 MG PO TABS: 20.0000 mg | ORAL_TABLET | Freq: Every day | ORAL | 3 refills | Status: DC

## 2022-06-19 NOTE — Telephone Encounter (Signed)
pharmacy called states that per patient insurance she needs a 90 day supply of the trazodone or they will not pay for.   Pt last seen on 05-29-22 next appt 07-24-22     traZODone (DESYREL) 50 MG tablet Medication Date: 05/29/2022 Department: [facility] Ordering/Authorizing: [name], MD   Order Providers  Prescribing Provider Encounter Provider  [name], MD [name], MD   Outpatient Medication Detail   Disp Refills Start End   traZODone (DESYREL) 50 MG tablet 30 tablet 1 06/18/2022 08/17/2022   Sig - Route: Take 0.5-1 tablets (25-50 mg total) by mouth at bedtime as needed for sleep. - Oral   Sent to pharmacy as: traZODone (DESYREL) 50 MG tablet   E-Prescribing Status: Receipt confirmed by pharmacy (05/29/2022 11:18 AM EDT)

## 2022-06-19 NOTE — Therapy (Signed)
OUTPATIENT PHYSICAL THERAPY BREAST CANCER TREATMENT NOTE   Patient Name: Maria Diaz MRN: [id number] DOB:04-18-1971, 51 y.o., female Today's Date: 06/19/2022   PT End of Session - 06/19/22 1001     Visit Number 14    Number of Visits 18    Date for PT Re-Evaluation 07/03/22    PT Start Time 1007    PT Stop Time 6568    PT Time Calculation (min) 32 min    Activity Tolerance Patient tolerated treatment well    Behavior During Therapy WFL for tasks assessed/performed               Past Medical History:  Diagnosis Date   Anemia    Anxiety    Depression    Glaucoma    Hives    chronic, on Xolair injections   Past Surgical History:  Procedure Laterality Date   BREAST BIOPSY Left 01/19/2022   BREAST CYST EXCISION Left 01/28/2021   Procedure: LEFT BREAST MASS EXCISION;  Surgeon: Rolm Bookbinder, MD;  Location: [place];  Service: General;  Laterality: Left;  START TIME OF 3:00 PM FOR 60 MINUTES IN ROOM 8   BREAST LUMPECTOMY WITH RADIOACTIVE SEED AND SENTINEL LYMPH NODE BIOPSY Left 04/13/2022   Procedure: LEFT BREAST BRACKETED LUMPECTOMY WITH RADIOACTIVE SEED AND AXILLARY SENTINEL LYMPH NODE BIOPSY;  Surgeon: Rolm Bookbinder, MD;  Location: [place];  Service: General;  Laterality: Left;   HYSTEROSCOPY W/ ENDOMETRIAL ABLATION  08/14/2020   [facility]   MASTOPEXY Bilateral 04/21/2022   Procedure: MASTOPEXY;  Surgeon: Irene Limbo, MD;  Location: [place];  Service: Plastics;  Laterality: Bilateral;   Patient Active Problem List   Diagnosis Date Noted   Anemia 12/31/2021   Malignant neoplasm of upper-inner quadrant of left breast in female, estrogen receptor positive ([place]) 12/31/2021   Open angle with borderline findings and low glaucoma risk in both eyes 01/11/2016   Chronic idiopathic urticaria 01/11/2016   Age-related nuclear cataract of both eyes 01/11/2016   Eczema 01/11/2016    PCP: [name], MD    REFERRING PROVIDER: Rolm Bookbinder, MD   REFERRING DIAG: [id number] (ICD-10-CM) - Malignant neoplasm of upper-inner quadrant of left female breast   THERAPY DIAG:  Stiffness of left shoulder, not elsewhere classified  Malignant neoplasm of upper-inner quadrant of left breast in female, estrogen receptor positive ([place])  Stiffness of right shoulder, not elsewhere classified  Muscle weakness (generalized)  Aftercare following surgery for neoplasm  Abnormal posture  Rationale for Evaluation and Treatment Rehabilitation  ONSET DATE: 12/09/21  SUBJECTIVE:  SUBJECTIVE STATEMENT:  I am tired.    PERTINENT HISTORY:  Patient was diagnosed on 12/09/21 with left grade 3. It measures 3 cm and is located in the upper inner quadrant. 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 04/13/22- underwent L breast lumpectomy and SLNB (0/3) 04/21/22- bilateral mastopexy   PATIENT GOALS:  Reassess how my recovery is going related to arm function, pain, and swelling.  PAIN:  Are you having pain? 4/10 breast from radiation   PRECAUTIONS: Recent Surgery, left UE Lymphedema risk,   OBJECTIVE  OBSERVATION: Bilateral healing mastopexy scars with both sides healed with not drainage. Pink scar visible on right side.  Pt has redness on inferior aspect of both breasts.  Educated to monitor this as radiation progresses as friction from bra band may become problematic    UPPER EXTREMITY AROM/PROM:   A/PROM RIGHT  01/01/2022   R 05/29/22 06/04/22 06/09/22  Shoulder extension 61 41 61   Shoulder flexion 164 132 151 162  Shoulder abduction 173 159 175 172  Shoulder internal rotation 55 58 63 64  Shoulder external rotation 90 80 82                           (Blank rows = not tested)   A/PROM LEFT  01/01/2022 L 05/07/22  05/21/22 05/29/22 06/04/22 06/09/22  Shoulder extension 63 59 60 41 39 43  Shoulder flexion 165 165 165 141 149 157  Shoulder abduction 180 145 165 139 154 170  Shoulder internal rotation 68 71  61 59 43  Shoulder external rotation 88 80 88 65 90                           (Blank rows = not tested)     LYMPHEDEMA ASSESSMENTS:    LANDMARK RIGHT  01/01/2022  10 cm proximal to olecranon process 36.5  Olecranon process 30.5  10 cm proximal to ulnar styloid process 23.5  Just proximal to ulnar styloid process 18.1  Across hand at thumb web space 20.2  At base of 2nd digit 6.8  (Blank rows = not tested)   LANDMARK LEFT  01/01/2022 L 05/07/22  10 cm proximal to olecranon process 36.5 37  Olecranon process 31.1 31.5  10 cm proximal to ulnar styloid process 24.2 23.5  Just proximal to ulnar styloid process 18.7 19  Across hand at thumb web space 20.8 20.3  At base of 2nd digit 6.9 6.8  (Blank rows = not tested)     TODAY'S TREATMENT: 06/19/22 Exercises -LTR 5" x 5 bil due to new back pain from radiation - Supine Posterior Pelvic Tilt  - 10 reps - 2-3 second hold - Hooklying Rib Cage Breathing  - 10 reps - Supine Cervical Rotation AROM on Flat Ball  - 3 reps  - Supine Lower Trapezius Strengthening  - 10 reps - Supine Bilateral Shoulder Protraction  - 10 reps - Pilates Bridge - 10 reps - Supine Transversus Abdominis Bracing with Heel Slide  - 10 reps bil - Supine March with Posterior Pelvic Tilt  - 10 reps - Supine dowel press 2#   -  10 reps - Arms Scissors  - 10 reps - Hooklying Shoulder T  - 10 reps -Sit to stand:  x 15 -sidelying ER bil 1# 2x5, clams bil 2x5  -standing scaption alternating with flexion 1# 2x10 bil -pulleys 39mn flexion and abduction-  held due to radiation status  06/16/22 Exercises - Supine Posterior Pelvic Tilt  - 10 reps - 2-3 second hold - Hooklying Rib Cage Breathing  - 10 reps - Supine Cervical Rotation AROM on Flat Ball  - 3 reps  - Supine Lower Trapezius  Strengthening  - 5 reps - Supine Bilateral Shoulder Protraction  - 10 reps - Pilates Bridge - 5 reps - Supine Transversus Abdominis Bracing with Heel Slide  - 5 reps - Supine March with Posterior Pelvic Tilt  - 5 reps - Supine Shoulder Flexion with Dowel  -  5 reps - Arms Scissors  - 10 reps - Hooklying Shoulder T  - 10 reps - Mermaid seated  - 3 reps -Sit to stand:  x 10  -sidelying ER bil 1# 2x5, clams bil 2x5  -standing scaption alternating with flexion 1# 2x5 bil -pulleys 80mn flexion and abduction  06/12/22: Therapeutic exercise : Began with neck and upper thoracic ROM with bra removed to observe pulling on scar. Supine core exercise: pelvic anterior/posterior / and lateral titls on mat and with purple ball under sacum x ~ 15 reps. Pt with bent knee lift with core engaged x 5 reps each side.  Pt with central bulging at abdomen from possible rectus diastasis and was able to reduce this with active abdominal activity  Supine shoulder ROM bilaterally.  Pt with some pulling at chest and numbness in fingers with diagonal elevation on  both sides.Pt cautioned to do just take to her pain limit and do a deep breath, not to overstretch into this to protect from traction on radiated skin Sidelying: bilateral UE and LEx 5 reps: hand to ceilining with 5 reps in each direction, shoulder abduction to 90 degrees. Shoulder flexion to pain end range, towel at waist for external rotation with elbow bent 1# in hand 2 sets of 5. LE straight leg and bent leg abduction, clams Functional test: pt able to do 10 reps of sit to stand in 30 seconds with RPE of 7/10 instruction to do 3-5 reps at home, some with "power up" component, and to be active at least every 2 hours during the day    PATIENT EDUCATION:  Education details: Supine scapular series (except D2) with yellow theraband Person educated: Patient Education method: Explanation and Handouts Education comprehension: verbalized understanding, returned  demonstration and will benefit from further review  HOME EXERCISE PROGRAM:  Reviewed previously given post op HEP. Snow angel in supine on L  ASSESSMENT: CLINICAL IMPRESSION: Continued with pilates/gentle based TE today.  Pt continues  to have some pain in the breast, edema, and redness but would like to continue with TE.  Pt did not require PROM due to excellent ROM today. Continues with shorter session time due to fatigue and reporting a good overall workout.   Pt will benefit from skilled therapeutic intervention to improve on the following deficits: Decreased knowledge of precautions, impaired UE functional use, pain, decreased ROM, postural dysfunction.   PT treatment/interventions: ADL/Self care home management, Therapeutic exercises, Therapeutic activity, Patient/Family education, Joint mobilization, Orthotic/Fit training, Manual lymph drainage, Compression bandaging, scar mobilization, and Manual therapy     GOALS: Goals reviewed with patient? Yes  LONG TERM GOALS:  (STG=LTG)  GOALS Name Target Date  Goal status  1 Pt will demonstrate she has regained full shoulder ROM and function post operatively compared to baselines.  Baseline: 07/03/2022 IN PROGRESS  2 Pt will demonstrate 170 degrees of left shoulder abduction to allow her to reach  out to the side and return to PLOF.  8/11//2023 Progress towards goal 149 degrees - 06/04/22  3 Pt will be independent in a home exercise program for ROM and strengthening including Strength ABC program  [id number]/112023 Progressing towards goal; Pt is independent with initial HEP - 06/05/22  4 Pt will report a 75% improvement in feeling of tightness in L axilla with less instances of tingling in LUE to allow improved comfort. 07/03/2022 Goal met Pt reports 75% improvement at this time of Lt UE symptoms - 06/05/22  5 Pt will do 13 reps of sit to stand in 30 seconds with RPE of 4/10   Baseline: 10 reps with RPE of 7/10  07/03/2022 New     PLAN: PT  FREQUENCY/DURATION: 2x/wk for 4 weeks  PLAN FOR NEXT SESSION: Cont A/AA/PROM to L shoulder and R shoulder, supine scap (pt only doing these 2x/wk for now and 5 reps) and eventually Strength ABC program continue and progress core and LE strengthening    Stark Bray, PT 06/19/2022, 10:41 AM

## 2022-06-19 NOTE — Telephone Encounter (Signed)
Ordered

## 2022-06-19 NOTE — Progress Notes (Signed)
[facility] Follow up:    Maria Rio, MD [place] [phone]   DIAGNOSIS:  Cancer Staging  Malignant neoplasm of upper-inner quadrant of left breast in female, estrogen receptor positive ([place]) Staging form: Breast, AJCC 8th Edition - Clinical: Stage IIB (cT2, cN0, cM0, G3, ER+, PR-, HER2-) - Unsigned Stage prefix: Initial diagnosis Histologic grading system: 3 grade system   SUMMARY OF ONCOLOGIC HISTORY: Oncology History  Malignant neoplasm of upper-inner quadrant of left breast in female, estrogen receptor positive ([place])  12/31/2021 Initial Diagnosis   Malignant neoplasm of upper-inner quadrant of left breast in female, estrogen receptor positive ([place])   01/08/2022 -  Chemotherapy   Completed 4 cycles of neoadjuvant TC, last cycle received on March 12, 2022     04/21/2022 Definitive Surgery   She had left breast lumpectomy with no residual carcinoma status post neoadjuvant therapy, treatment related changes, no carcinoma identified in lymph nodes.  Final pathologic staging PT0N0 she also underwent bilateral mastopexy, benign breast with no evidence of carcinoma     CURRENT THERAPY: Completed 4 cycles of TC  INTERVAL HISTORY:  Maria Diaz 51 y.o. female returns for evaluation. She is currently undergoing adjuvant radiation, anticipated completion date third week of August.  She is tolerating it well.  She denies any new complaints.  She is excited that her hair has started growing back.  Rest of the pertinent 10 point ROS reviewed and negative  Patient Active Problem List   Diagnosis Date Noted   Anemia 12/31/2021   Malignant neoplasm of upper-inner quadrant of left breast in female, estrogen receptor positive ([place]) 12/31/2021   Open angle with borderline findings and low glaucoma risk in both eyes 01/11/2016   Chronic idiopathic urticaria 01/11/2016   Age-related nuclear cataract of both eyes 01/11/2016   Eczema 01/11/2016     is allergic to haemophilus influenzae vaccines and penicillins.  MEDICAL HISTORY: Past Medical History:  Diagnosis Date   Anemia    Anxiety    Depression    Glaucoma    Hives    chronic, on Xolair injections    SURGICAL HISTORY: Past Surgical History:  Procedure Laterality Date   BREAST BIOPSY Left 01/19/2022   BREAST CYST EXCISION Left 01/28/2021   Procedure: LEFT BREAST MASS EXCISION;  Surgeon: Rolm Bookbinder, MD;  Location: [place];  Service: General;  Laterality: Left;  START TIME OF 3:00 PM FOR 60 MINUTES IN ROOM 8   BREAST LUMPECTOMY WITH RADIOACTIVE SEED AND SENTINEL LYMPH NODE BIOPSY Left 04/13/2022   Procedure: LEFT BREAST BRACKETED LUMPECTOMY WITH RADIOACTIVE SEED AND AXILLARY SENTINEL LYMPH NODE BIOPSY;  Surgeon: Rolm Bookbinder, MD;  Location: [place];  Service: General;  Laterality: Left;   HYSTEROSCOPY W/ ENDOMETRIAL ABLATION  08/14/2020   [facility]   MASTOPEXY Bilateral 04/21/2022   Procedure: MASTOPEXY;  Surgeon: Irene Limbo, MD;  Location: [facility];  Service: Plastics;  Laterality: Bilateral;    SOCIAL HISTORY: Social History   Socioeconomic History   Marital status: Divorced    Spouse name: Not on file   Number of children: Not on file   Years of education: Not on file   Highest education level: Not on file  Occupational History   Not on file  Tobacco Use   Smoking status: Never   Smokeless tobacco: Never  Substance and Sexual Activity   Alcohol use: Yes    Alcohol/week: 1.0 standard drink of alcohol  Types: 1 Glasses of wine per week    Comment: holidays   Drug use: Never   Sexual activity: Yes    Birth control/protection: None    Comment: ablation  Other Topics Concern   Not on file  Social History Narrative   Not on file   Social Determinants of Health   Financial Resource Strain: Not on file  Food Insecurity: Not on file  Transportation Needs: Not on file  Physical  Activity: Not on file  Stress: Not on file  Social Connections: Not on file  Intimate Partner Violence: Not on file    FAMILY HISTORY: Family History  Problem Relation Age of Onset   Anxiety disorder Sister    Drug abuse Mother     Review of Systems  Constitutional:  Negative for appetite change, chills, fatigue, fever and unexpected weight change.  HENT:   Negative for hearing loss, lump/mass and trouble swallowing.   Eyes:  Negative for eye problems and icterus.  Respiratory:  Negative for chest tightness, cough and shortness of breath.   Cardiovascular:  Negative for chest pain, leg swelling and palpitations.  Gastrointestinal:  Negative for abdominal distention, abdominal pain, constipation, diarrhea, nausea and vomiting.  Endocrine: Negative for hot flashes.  Genitourinary:  Negative for difficulty urinating.   Musculoskeletal:  Negative for arthralgias.  Skin:  Negative for itching and rash.  Neurological:  Negative for dizziness, extremity weakness, headaches and numbness.  Hematological:  Negative for adenopathy. Does not bruise/bleed easily.  Psychiatric/Behavioral:  Negative for depression. The patient is not nervous/anxious.       PHYSICAL EXAMINATION  ECOG PERFORMANCE STATUS: 1 - Symptomatic but completely ambulatory  Vitals:   06/19/22 0837  BP: 102/63  Pulse: 90  Resp: 16  Temp: (!) 97.5 F (36.4 C)  SpO2: 99%     Physical Exam Constitutional:      Appearance: Normal appearance.  HENT:     Head: Normocephalic and atraumatic.  Eyes:     General: No scleral icterus. Chest:       Comments: Bilateral mammoplasty scars noted, still healing.  Musculoskeletal:        General: No swelling.  Skin:    General: Skin is warm and dry.  Neurological:     Mental Status: She is alert.  Psychiatric:        Mood and Affect: Mood normal.     LABORATORY DATA:  CBC    Component Value Date/Time   WBC 12.1 (H) 03/12/2022 0826   WBC 8.0 07/20/2020 2320    RBC 4.13 03/12/2022 0826   HGB 12.7 03/12/2022 0826   HCT 37.8 03/12/2022 0826   PLT 314 03/12/2022 0826   MCV 91.5 03/12/2022 0826   MCH 30.8 03/12/2022 0826   MCHC 33.6 03/12/2022 0826   RDW 14.4 03/12/2022 0826   LYMPHSABS 1.2 03/12/2022 0826   MONOABS 0.9 03/12/2022 0826   EOSABS 0.0 03/12/2022 0826   BASOSABS 0.0 03/12/2022 0826    CMP     Component Value Date/Time   NA 140 03/12/2022 0826   K 3.8 03/12/2022 0826   CL 111 03/12/2022 0826   CO2 24 03/12/2022 0826   GLUCOSE 107 (H) 03/12/2022 0826   BUN 15 03/12/2022 0826   CREATININE 0.71 03/12/2022 0826   CALCIUM 9.8 03/12/2022 0826   PROT 7.7 03/12/2022 0826   ALBUMIN 4.2 03/12/2022 0826   AST 11 (L) 03/12/2022 0826   ALT 9 03/12/2022 0826   ALKPHOS 71 03/12/2022 0826  BILITOT 0.3 03/12/2022 0826   GFRNONAA >60 03/12/2022 0826   GFRAA >60 07/20/2020 2320    ASSESSMENT and THERAPY PLAN:   Malignant neoplasm of upper-inner quadrant of left breast in female, estrogen receptor positive ([place]) This is a very pleasant 51 year old female patient with 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  She is doing well, recovering well from surgery. She is now undergoing adjuvant radiation.  She will complete radiation July 13, 2022.  Today we have once again discussed about role of antiestrogen therapy given her ER 40% weak staining tumor on the initial biopsy.  We have discussed about tamoxifen, mechanism of action, adverse effects of tamoxifen including but not limited to postmenopausal symptoms, small risk of DVT/PE, endometrial thickening and endometrial cancer, hot flashes, vaginal discharge etc.  She is willing to give it a try.  She will start about 2 to 3 weeks after completing radiation.   Tamoxifen dispensed to the pharmacy of her choice.  We have discussed about symptoms and signs of DVT as well as PE and the need for immediate attention in case she develops these.  If she has to undergo any major surgery she was recommended to hold tamoxifen for a week before and a week after surgery.  Return to clinic in 3 to 4 months with survivorship clinic in 6 to 7 months for follow-up with me  Total time spent: 30 minutes including history, physical exam, review of records, pathology, counseling about for future plan and coordination of care *Total Encounter Time as defined by the Centers for Medicare and Medicaid Services includes, in addition to the face-to-face time of a patient visit (documented in the note above) non-face-to-face time: obtaining and reviewing outside history, ordering and reviewing medications, tests or procedures, care coordination (communications with other health care professionals or caregivers) and documentation in the medical record.

## 2022-06-19 NOTE — Assessment & Plan Note (Addendum)
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  She is doing well, recovering well from surgery. She is now undergoing adjuvant radiation.  She will complete radiation July 13, 2022.  Today we have once again discussed about role of antiestrogen therapy given her ER 40% weak staining tumor on the initial biopsy.  We have discussed about tamoxifen, mechanism of action, adverse effects of tamoxifen including but not limited to postmenopausal symptoms, small risk of DVT/PE, endometrial thickening and endometrial cancer, hot flashes, vaginal discharge etc.  She is willing to give it a try.  She will start about 2 to 3 weeks after completing radiation.  Tamoxifen dispensed to the pharmacy of her choice.  We have discussed about symptoms and signs of DVT as well as PE and the need for immediate attention in case she develops these.  If she has to undergo any major surgery she was recommended to hold tamoxifen for a week before and a week after surgery.  Return to clinic in 3 to 4 months with survivorship clinic in 6 to 7 months for follow-up with me

## 2022-06-20 ENCOUNTER — Other Ambulatory Visit: Payer: Self-pay

## 2022-06-22 ENCOUNTER — Other Ambulatory Visit: Payer: Self-pay

## 2022-06-22 ENCOUNTER — Ambulatory Visit
Admission: RE | Admit: 2022-06-22 | Discharge: 2022-06-22 | Disposition: A | Discharge status: Home / Self Care | Payer: 59 | Source: Ambulatory Visit | Attending: Radiation Oncology | Admitting: Radiation Oncology

## 2022-06-22 DIAGNOSIS — Z510 Encounter for antineoplastic radiation therapy: Secondary | ICD-10-CM | POA: Diagnosis not present

## 2022-06-22 LAB — RAD ONC ARIA SESSION SUMMARY
Course Elapsed Days: 25
Plan Fractions Treated to Date: 18
Plan Prescribed Dose Per Fraction: 1.8 Gy
Plan Total Fractions Prescribed: 28
Plan Total Prescribed Dose: 50.4 Gy
Reference Point Dosage Given to Date: 32.4 Gy
Reference Point Session Dosage Given: 1.8 Gy
Session Number: 18

## 2022-06-23 ENCOUNTER — Ambulatory Visit
Admission: RE | Admit: 2022-06-23 | Discharge: 2022-06-23 | Disposition: A | Discharge status: Home / Self Care | Payer: 59 | Source: Ambulatory Visit | Attending: Radiation Oncology | Admitting: Radiation Oncology

## 2022-06-23 ENCOUNTER — Other Ambulatory Visit: Payer: Self-pay

## 2022-06-23 ENCOUNTER — Ambulatory Visit: Payer: 59 | Attending: Hematology and Oncology

## 2022-06-23 DIAGNOSIS — Z79899 Other long term (current) drug therapy: Secondary | ICD-10-CM | POA: Diagnosis not present

## 2022-06-23 DIAGNOSIS — Z170 Estrogen receptor positive status [ER+]: Secondary | ICD-10-CM | POA: Diagnosis present

## 2022-06-23 DIAGNOSIS — C50212 Malignant neoplasm of upper-inner quadrant of left female breast: Secondary | ICD-10-CM | POA: Insufficient documentation

## 2022-06-23 DIAGNOSIS — Z171 Estrogen receptor negative status [ER-]: Secondary | ICD-10-CM | POA: Insufficient documentation

## 2022-06-23 DIAGNOSIS — M6281 Muscle weakness (generalized): Secondary | ICD-10-CM | POA: Insufficient documentation

## 2022-06-23 DIAGNOSIS — Z793 Long term (current) use of hormonal contraceptives: Secondary | ICD-10-CM | POA: Insufficient documentation

## 2022-06-23 DIAGNOSIS — M25612 Stiffness of left shoulder, not elsewhere classified: Secondary | ICD-10-CM | POA: Insufficient documentation

## 2022-06-23 DIAGNOSIS — Z483 Aftercare following surgery for neoplasm: Secondary | ICD-10-CM | POA: Insufficient documentation

## 2022-06-23 DIAGNOSIS — M25611 Stiffness of right shoulder, not elsewhere classified: Secondary | ICD-10-CM | POA: Insufficient documentation

## 2022-06-23 DIAGNOSIS — R293 Abnormal posture: Secondary | ICD-10-CM | POA: Insufficient documentation

## 2022-06-23 LAB — RAD ONC ARIA SESSION SUMMARY
Course Elapsed Days: 26
Plan Fractions Treated to Date: 19
Plan Prescribed Dose Per Fraction: 1.8 Gy
Plan Total Fractions Prescribed: 28
Plan Total Prescribed Dose: 50.4 Gy
Reference Point Dosage Given to Date: 34.2 Gy
Reference Point Session Dosage Given: 1.8 Gy
Session Number: 19

## 2022-06-23 NOTE — Therapy (Signed)
OUTPATIENT PHYSICAL THERAPY BREAST CANCER TREATMENT NOTE   Patient Name: Maria Diaz MRN: [id number] DOB:March 04, 1971, 51 y.o., female Today's Date: 06/23/2022   PT End of Session - 06/23/22 1110     Visit Number 15    Number of Visits 18    Date for PT Re-Evaluation 07/03/22    PT Start Time 1107    PT Stop Time 1153    PT Time Calculation (min) 46 min    Activity Tolerance Patient tolerated treatment well    Behavior During Therapy WFL for tasks assessed/performed               Past Medical History:  Diagnosis Date   Anemia    Anxiety    Depression    Glaucoma    Hives    chronic, on Xolair injections   Past Surgical History:  Procedure Laterality Date   BREAST BIOPSY Left 01/19/2022   BREAST CYST EXCISION Left 01/28/2021   Procedure: LEFT BREAST MASS EXCISION;  Surgeon: Rolm Bookbinder, MD;  Location: [place];  Service: General;  Laterality: Left;  START TIME OF 3:00 PM FOR 60 MINUTES IN ROOM 8   BREAST LUMPECTOMY WITH RADIOACTIVE SEED AND SENTINEL LYMPH NODE BIOPSY Left 04/13/2022   Procedure: LEFT BREAST BRACKETED LUMPECTOMY WITH RADIOACTIVE SEED AND AXILLARY SENTINEL LYMPH NODE BIOPSY;  Surgeon: Rolm Bookbinder, MD;  Location: [place];  Service: General;  Laterality: Left;   HYSTEROSCOPY W/ ENDOMETRIAL ABLATION  08/14/2020   [facility]   MASTOPEXY Bilateral 04/21/2022   Procedure: MASTOPEXY;  Surgeon: Irene Limbo, MD;  Location: [place];  Service: Plastics;  Laterality: Bilateral;   Patient Active Problem List   Diagnosis Date Noted   Anemia 12/31/2021   Malignant neoplasm of upper-inner quadrant of left breast in female, estrogen receptor positive ([place]) 12/31/2021   Open angle with borderline findings and low glaucoma risk in both eyes 01/11/2016   Chronic idiopathic urticaria 01/11/2016   Age-related nuclear cataract of both eyes 01/11/2016   Eczema 01/11/2016    PCP: [name], MD    REFERRING PROVIDER: Rolm Bookbinder, MD   REFERRING DIAG: [phone] (ICD-10-CM) - Malignant neoplasm of upper-inner quadrant of left female breast   THERAPY DIAG:  Malignant neoplasm of upper-inner quadrant of left breast in female, estrogen receptor positive ([place])  Stiffness of left shoulder, not elsewhere classified  Stiffness of right shoulder, not elsewhere classified  Muscle weakness (generalized)  Aftercare following surgery for neoplasm  Abnormal posture  Rationale for Evaluation and Treatment Rehabilitation  ONSET DATE: 12/09/21  SUBJECTIVE:  SUBJECTIVE STATEMENT:  I am tired today. I had some pains in my Lt breast over the weekend but I took some tylenol for it and that helped.  PERTINENT HISTORY:  Patient was diagnosed on 12/09/21 with left grade 3. It measures 3 cm and is located in the upper inner quadrant. 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 04/13/22- underwent L breast lumpectomy and SLNB (0/3) 04/21/22- bilateral mastopexy   PATIENT GOALS:  Reassess how my recovery is going related to arm function, pain, and swelling.  PAIN:  Are you having pain? 0/10  PRECAUTIONS: Recent Surgery, left UE Lymphedema risk,   OBJECTIVE  OBSERVATION: Bilateral healing mastopexy scars with both sides healed with not drainage. Pink scar visible on right side.  Pt has redness on inferior aspect of both breasts.  Educated to monitor this as radiation progresses as friction from bra band may become problematic    UPPER EXTREMITY AROM/PROM:   A/PROM RIGHT  01/01/2022   R 05/29/22 06/04/22 06/09/22  Shoulder extension 61 41 61   Shoulder flexion 164 132 151 162  Shoulder abduction 173 159 175 172  Shoulder internal rotation 55 58 63 64  Shoulder external rotation 90 80 82                            (Blank rows = not tested)   A/PROM LEFT  01/01/2022 L 05/07/22 05/21/22 05/29/22 06/04/22 06/09/22  Shoulder extension 63 59 60 41 39 43  Shoulder flexion 165 165 165 141 149 157  Shoulder abduction 180 145 165 139 154 170  Shoulder internal rotation 68 71  61 59 43  Shoulder external rotation 88 80 88 65 90                           (Blank rows = not tested)     LYMPHEDEMA ASSESSMENTS:    LANDMARK RIGHT  01/01/2022  10 cm proximal to olecranon process 36.5  Olecranon process 30.5  10 cm proximal to ulnar styloid process 23.5  Just proximal to ulnar styloid process 18.1  Across hand at thumb web space 20.2  At base of 2nd digit 6.8  (Blank rows = not tested)   LANDMARK LEFT  01/01/2022 L 05/07/22  10 cm proximal to olecranon process 36.5 37  Olecranon process 31.1 31.5  10 cm proximal to ulnar styloid process 24.2 23.5  Just proximal to ulnar styloid process 18.7 19  Across hand at thumb web space 20.8 20.3  At base of 2nd digit 6.9 6.8  (Blank rows = not tested)     TODAY'S TREATMENT: 06/23/22: Therapeutic Exercises Instructed pt in Strength ABC program having her return therapist demo of each exercise  -Stretches all held for 15 sec and 1x each side  -Strengthening exercises all done x10, except for step ups done 5 reps each   06/19/22 Exercises -LTR 5" x 5 bil due to new back pain from radiation - Supine Posterior Pelvic Tilt  - 10 reps - 2-3 second hold - Hooklying Rib Cage Breathing  - 10 reps - Supine Cervical Rotation AROM on Flat Ball  - 3 reps  - Supine Lower Trapezius Strengthening  - 10 reps - Supine Bilateral Shoulder Protraction  - 10 reps - Pilates Bridge - 10 reps - Supine Transversus Abdominis Bracing with Heel Slide  - 10 reps bil - Supine March with Posterior  Pelvic Tilt  - 10 reps - Supine dowel press 2#   -  10 reps - Arms Scissors  - 10 reps - Hooklying Shoulder T  - 10 reps -Sit to stand:  x 15 -sidelying ER bil 1# 2x5, clams bil 2x5   -standing scaption alternating with flexion 1# 2x10 bil -pulleys 37mn flexion and abduction- held due to radiation status  06/16/22 Exercises - Supine Posterior Pelvic Tilt  - 10 reps - 2-3 second hold - Hooklying Rib Cage Breathing  - 10 reps - Supine Cervical Rotation AROM on Flat Ball  - 3 reps  - Supine Lower Trapezius Strengthening  - 5 reps - Supine Bilateral Shoulder Protraction  - 10 reps - Pilates Bridge - 5 reps - Supine Transversus Abdominis Bracing with Heel Slide  - 5 reps - Supine March with Posterior Pelvic Tilt  - 5 reps - Supine Shoulder Flexion with Dowel  -  5 reps - Arms Scissors  - 10 reps - Hooklying Shoulder T  - 10 reps - Mermaid seated  - 3 reps -Sit to stand:  x 10  -sidelying ER bil 1# 2x5, clams bil 2x5  -standing scaption alternating with flexion 1# 2x5 bil -pulleys 212m flexion and abduction    PATIENT EDUCATION:  Education details: Supine scapular series (except D2) with yellow theraband Person educated: Patient Education method: Explanation and Handouts Education comprehension: verbalized understanding, returned demonstration and will benefit from further review  HOME EXERCISE PROGRAM:  Reviewed previously given post op HEP. Snow angel in supine on L  ASSESSMENT: CLINICAL IMPRESSION: Instructed pt in Strength ABC program today so she can have something to work on progressing to at home. Educated her that for now performing this 2x/wk is best with no weighted resistance due to currently undergoing radiation and in doing so will keep her lymphedema risk low. She was able to verbalize a good understanding. Also encouraged her to be working on a daily walking routine and to cont incorporate stretching into her ADLs throughout radiation. Pt was fatigued by end of session and reported feeling like she got a good workout.   Pt will benefit from skilled therapeutic intervention to improve on the following deficits: Decreased knowledge of precautions,  impaired UE functional use, pain, decreased ROM, postural dysfunction.   PT treatment/interventions: ADL/Self care home management, Therapeutic exercises, Therapeutic activity, Patient/Family education, Joint mobilization, Orthotic/Fit training, Manual lymph drainage, Compression bandaging, scar mobilization, and Manual therapy     GOALS: Goals reviewed with patient? Yes  LONG TERM GOALS:  (STG=LTG)  GOALS Name Target Date  Goal status  1 Pt will demonstrate she has regained full shoulder ROM and function post operatively compared to baselines.  Baseline: 07/03/2022 IN PROGRESS  2 Pt will demonstrate 170 degrees of left shoulder abduction to allow her to reach out to the side and return to PLOF.  8/11//2023 Progress towards goal 149 degrees - 06/04/22  3 Pt will be independent in a home exercise program for ROM and strengthening including Strength ABC program  8/112023 Progressing towards goal; Pt is independent with initial HEP - 06/05/22  4 Pt will report a 75% improvement in feeling of tightness in L axilla with less instances of tingling in LUE to allow improved comfort. 07/03/2022 Goal met Pt reports 75% improvement at this time of Lt UE symptoms - 06/05/22  5 Pt will do 13 reps of sit to stand in 30 seconds with RPE of 4/10   Baseline: 10 reps with RPE of  7/10  07/03/2022 New     PLAN: PT FREQUENCY/DURATION: 2x/wk for 4 weeks  PLAN FOR NEXT SESSION: Review prn Strength ABC program; continue and progress core and LE strengthening as pt tolerates due to increase fatigue as she is currently undergoing radiation   Otelia Limes, PTA 06/23/2022, 11:57 AM

## 2022-06-24 ENCOUNTER — Other Ambulatory Visit: Payer: Self-pay

## 2022-06-24 ENCOUNTER — Ambulatory Visit
Admission: RE | Admit: 2022-06-24 | Discharge: 2022-06-24 | Disposition: A | Discharge status: Home / Self Care | Payer: 59 | Source: Ambulatory Visit | Attending: Radiation Oncology | Admitting: Radiation Oncology

## 2022-06-24 DIAGNOSIS — C50212 Malignant neoplasm of upper-inner quadrant of left female breast: Secondary | ICD-10-CM | POA: Diagnosis not present

## 2022-06-24 LAB — RAD ONC ARIA SESSION SUMMARY
Course Elapsed Days: 27
Plan Fractions Treated to Date: 20
Plan Prescribed Dose Per Fraction: 1.8 Gy
Plan Total Fractions Prescribed: 28
Plan Total Prescribed Dose: 50.4 Gy
Reference Point Dosage Given to Date: 36 Gy
Reference Point Session Dosage Given: 1.8 Gy
Session Number: 20

## 2022-06-25 ENCOUNTER — Ambulatory Visit: Payer: 59 | Admitting: Rehabilitation

## 2022-06-25 ENCOUNTER — Ambulatory Visit
Admission: RE | Admit: 2022-06-25 | Discharge: 2022-06-25 | Disposition: A | Discharge status: Home / Self Care | Payer: 59 | Source: Ambulatory Visit | Attending: Radiation Oncology | Admitting: Radiation Oncology

## 2022-06-25 ENCOUNTER — Other Ambulatory Visit: Payer: Self-pay

## 2022-06-25 ENCOUNTER — Encounter: Payer: Self-pay | Admitting: Rehabilitation

## 2022-06-25 DIAGNOSIS — C50212 Malignant neoplasm of upper-inner quadrant of left female breast: Secondary | ICD-10-CM | POA: Diagnosis not present

## 2022-06-25 DIAGNOSIS — Z483 Aftercare following surgery for neoplasm: Secondary | ICD-10-CM

## 2022-06-25 DIAGNOSIS — R293 Abnormal posture: Secondary | ICD-10-CM

## 2022-06-25 DIAGNOSIS — M6281 Muscle weakness (generalized): Secondary | ICD-10-CM

## 2022-06-25 DIAGNOSIS — M25611 Stiffness of right shoulder, not elsewhere classified: Secondary | ICD-10-CM

## 2022-06-25 DIAGNOSIS — M25612 Stiffness of left shoulder, not elsewhere classified: Secondary | ICD-10-CM

## 2022-06-25 LAB — RAD ONC ARIA SESSION SUMMARY
Course Elapsed Days: 28
Plan Fractions Treated to Date: 21
Plan Prescribed Dose Per Fraction: 1.8 Gy
Plan Total Fractions Prescribed: 28
Plan Total Prescribed Dose: 50.4 Gy
Reference Point Dosage Given to Date: 37.8 Gy
Reference Point Session Dosage Given: 1.8 Gy
Session Number: 21

## 2022-06-25 NOTE — Therapy (Signed)
OUTPATIENT PHYSICAL THERAPY BREAST CANCER TREATMENT NOTE   Patient Name: Maria Diaz MRN: [id number] DOB:11-10-1971, 51 y.o., female Today's Date: 06/25/2022   PT End of Session - 06/25/22 0904     Visit Number 16    Number of Visits 18    Date for PT Re-Evaluation 07/03/22    PT Start Time 0906    PT Stop Time 0932    PT Time Calculation (min) 26 min    Activity Tolerance Patient tolerated treatment well    Behavior During Therapy [facility] for tasks assessed/performed               Past Medical History:  Diagnosis Date   Anemia    Anxiety    Depression    Glaucoma    Hives    chronic, on Xolair injections   Past Surgical History:  Procedure Laterality Date   BREAST BIOPSY Left 01/19/2022   BREAST CYST EXCISION Left 01/28/2021   Procedure: LEFT BREAST MASS EXCISION;  Surgeon: Rolm Bookbinder, MD;  Location: [place];  Service: General;  Laterality: Left;  START TIME OF 3:00 PM FOR 60 MINUTES IN ROOM 8   BREAST LUMPECTOMY WITH RADIOACTIVE SEED AND SENTINEL LYMPH NODE BIOPSY Left 04/13/2022   Procedure: LEFT BREAST BRACKETED LUMPECTOMY WITH RADIOACTIVE SEED AND AXILLARY SENTINEL LYMPH NODE BIOPSY;  Surgeon: Rolm Bookbinder, MD;  Location: [place];  Service: General;  Laterality: Left;   HYSTEROSCOPY W/ ENDOMETRIAL ABLATION  08/14/2020   [facility]   MASTOPEXY Bilateral 04/21/2022   Procedure: MASTOPEXY;  Surgeon: Irene Limbo, MD;  Location: [place];  Service: Plastics;  Laterality: Bilateral;   Patient Active Problem List   Diagnosis Date Noted   Anemia 12/31/2021   Malignant neoplasm of upper-inner quadrant of left breast in female, estrogen receptor positive ([place]) 12/31/2021   Open angle with borderline findings and low glaucoma risk in both eyes 01/11/2016   Chronic idiopathic urticaria 01/11/2016   Age-related nuclear cataract of both eyes 01/11/2016   Eczema 01/11/2016    PCP: [name], MD    REFERRING PROVIDER: Rolm Bookbinder, MD   REFERRING DIAG: [phone] (ICD-10-CM) - Malignant neoplasm of upper-inner quadrant of left female breast   THERAPY DIAG:  Malignant neoplasm of upper-inner quadrant of left breast in female, estrogen receptor positive ([place])  Stiffness of left shoulder, not elsewhere classified  Stiffness of right shoulder, not elsewhere classified  Muscle weakness (generalized)  Aftercare following surgery for neoplasm  Abnormal posture  Malignant neoplasm of upper-inner quadrant of left breast in female, estrogen receptor negative ([place])  Rationale for Evaluation and Treatment Rehabilitation  ONSET DATE: 12/09/21  SUBJECTIVE:  SUBJECTIVE STATEMENT: I'm tired   PERTINENT HISTORY:  Patient was diagnosed on 12/09/21 with left grade 3. It measures 3 cm and is located in the upper inner quadrant. 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 04/13/22- underwent L breast lumpectomy and SLNB (0/3) 04/21/22- bilateral mastopexy   PATIENT GOALS:  Reassess how my recovery is going related to arm function, pain, and swelling.  PAIN:  Are you having pain? 0/10  PRECAUTIONS: Recent Surgery, left UE Lymphedema risk,   OBJECTIVE  OBSERVATION: Bilateral healing mastopexy scars with both sides healed with not drainage. Pink scar visible on right side.  Pt has redness on inferior aspect of both breasts.  Educated to monitor this as radiation progresses as friction from bra band may become problematic    UPPER EXTREMITY AROM/PROM:   A/PROM RIGHT  01/01/2022   R 05/29/22 06/04/22 06/09/22  Shoulder extension 61 41 61   Shoulder flexion 164 132 151 162  Shoulder abduction 173 159 175 172  Shoulder internal rotation 55 58 63 64  Shoulder external rotation 90 80 82                            (Blank rows = not tested)   A/PROM LEFT  01/01/2022 L 05/07/22 05/21/22 05/29/22 06/04/22 06/09/22  Shoulder extension 63 59 60 41 39 43  Shoulder flexion 165 165 165 141 149 157  Shoulder abduction 180 145 165 139 154 170  Shoulder internal rotation 68 71  61 59 43  Shoulder external rotation 88 80 88 65 90                           (Blank rows = not tested)     LYMPHEDEMA ASSESSMENTS:    LANDMARK RIGHT  01/01/2022  10 cm proximal to olecranon process 36.5  Olecranon process 30.5  10 cm proximal to ulnar styloid process 23.5  Just proximal to ulnar styloid process 18.1  Across hand at thumb web space 20.2  At base of 2nd digit 6.8  (Blank rows = not tested)   LANDMARK LEFT  01/01/2022 L 05/07/22  10 cm proximal to olecranon process 36.5 37  Olecranon process 31.1 31.5  10 cm proximal to ulnar styloid process 24.2 23.5  Just proximal to ulnar styloid process 18.7 19  Across hand at thumb web space 20.8 20.3  At base of 2nd digit 6.9 6.8  (Blank rows = not tested)     TODAY'S TREATMENT: 06/25/22: Therapeutic Exercises Strength ABC program having her return therapist demo of each exercise  -Stretches all held for 15 sec and 1x each side  -Strengthening exercises all done x10, except for step ups done 5 reps each and with 2# when needed  06/23/22: Therapeutic Exercises Instructed pt in Strength ABC program having her return therapist demo of each exercise  -Stretches all held for 15 sec and 1x each side  -Strengthening exercises all done x10, except for step ups done 5 reps each   06/19/22 Exercises -LTR 5" x 5 bil due to new back pain from radiation - Supine Posterior Pelvic Tilt  - 10 reps - 2-3 second hold - Hooklying Rib Cage Breathing  - 10 reps - Supine Cervical Rotation AROM on Flat Ball  - 3 reps  - Supine Lower Trapezius Strengthening  - 10 reps - Supine Bilateral Shoulder Protraction  - 10 reps - Pilates  Bridge - 10 reps - Supine Transversus Abdominis Bracing  with Heel Slide  - 10 reps bil - Supine March with Posterior Pelvic Tilt  - 10 reps - Supine dowel press 2#   -  10 reps - Arms Scissors  - 10 reps - Hooklying Shoulder T  - 10 reps -Sit to stand:  x 15 -sidelying ER bil 1# 2x5, clams bil 2x5  -standing scaption alternating with flexion 1# 2x10 bil -pulleys 61mn flexion and abduction- held due to radiation status   PATIENT EDUCATION:  Education details: Supine scapular series (except D2) with yellow theraband Person educated: Patient Education method: Explanation and Handouts Education comprehension: verbalized understanding, returned demonstration and will benefit from further review  HOME EXERCISE PROGRAM:  Reviewed previously given post op HEP. Snow angel in supine on L  ASSESSMENT: CLINICAL IMPRESSION: All goals are met at this time and pt was instructed in Strength ABC program for final HEP.  Also encouraged her to be working on a daily walking routine and to cont incorporate stretching into her ADLs throughout radiation. Pt was fatigued by end of session and reported feeling like she got a good workout.   Pt will benefit from skilled therapeutic intervention to improve on the following deficits: Decreased knowledge of precautions, impaired UE functional use, pain, decreased ROM, postural dysfunction.   PT treatment/interventions: ADL/Self care home management, Therapeutic exercises, Therapeutic activity, Patient/Family education, Joint mobilization, Orthotic/Fit training, Manual lymph drainage, Compression bandaging, scar mobilization, and Manual therapy     GOALS: Goals reviewed with patient? Yes  LONG TERM GOALS:  (STG=LTG)  GOALS Name Target Date  Goal status  1 Pt will demonstrate she has regained full shoulder ROM and function post operatively compared to baselines.  Baseline: 07/03/2022 MET  2 Pt will demonstrate 170 degrees of left shoulder abduction to allow her to reach out to the side and return to PLOF.   8/11//2023 MET  3 Pt will be independent in a home exercise program for ROM and strengthening including Strength ABC program  8/112023 MET  4 Pt will report a 75% improvement in feeling of tightness in L axilla with less instances of tingling in LUE to allow improved comfort. 07/03/2022 Goal met Pt reports 75% improvement at this time of Lt UE symptoms - 06/05/22  5 Pt will do 13 reps of sit to stand in 30 seconds with RPE of 4/10   Baseline: 10 reps with RPE of 7/10  07/03/2022 MET     PLAN: PT FREQUENCY/DURATION: 2x/wk for 4 weeks  PLAN FOR NEXT SESSION: Review prn Strength ABC program; continue and progress core and LE strengthening as pt tolerates due to increase fatigue as she is currently undergoing radiation   TStark Bray PT 06/25/2022, 9:55 AM   PHYSICAL THERAPY DISCHARGE SUMMARY  Visits from Start of Care: 16  Current functional level related to goals / functional outcomes: See above   Remaining deficits: See above   Education / Equipment: Final HEP  Plan: Patient agrees to discharge. Patient is being discharged due to meeting the stated rehab goals.

## 2022-06-26 ENCOUNTER — Ambulatory Visit (HOSPITAL_COMMUNITY): Payer: 59 | Admitting: Psychiatry

## 2022-06-26 ENCOUNTER — Other Ambulatory Visit: Payer: Self-pay

## 2022-06-26 ENCOUNTER — Ambulatory Visit
Admission: RE | Admit: 2022-06-26 | Discharge: 2022-06-26 | Disposition: A | Discharge status: Home / Self Care | Payer: 59 | Source: Ambulatory Visit | Attending: Radiation Oncology | Admitting: Radiation Oncology

## 2022-06-26 DIAGNOSIS — C50212 Malignant neoplasm of upper-inner quadrant of left female breast: Secondary | ICD-10-CM | POA: Diagnosis not present

## 2022-06-26 LAB — RAD ONC ARIA SESSION SUMMARY
Course Elapsed Days: 29
Plan Fractions Treated to Date: 22
Plan Prescribed Dose Per Fraction: 1.8 Gy
Plan Total Fractions Prescribed: 28
Plan Total Prescribed Dose: 50.4 Gy
Reference Point Dosage Given to Date: 39.6 Gy
Reference Point Session Dosage Given: 1.8 Gy
Session Number: 22

## 2022-06-29 ENCOUNTER — Ambulatory Visit
Admission: RE | Admit: 2022-06-29 | Discharge: 2022-06-29 | Disposition: A | Discharge status: Home / Self Care | Payer: 59 | Source: Ambulatory Visit | Attending: Radiation Oncology | Admitting: Radiation Oncology

## 2022-06-29 ENCOUNTER — Other Ambulatory Visit: Payer: Self-pay

## 2022-06-29 DIAGNOSIS — C50212 Malignant neoplasm of upper-inner quadrant of left female breast: Secondary | ICD-10-CM | POA: Diagnosis not present

## 2022-06-29 LAB — RAD ONC ARIA SESSION SUMMARY
Course Elapsed Days: 32
Plan Fractions Treated to Date: 23
Plan Prescribed Dose Per Fraction: 1.8 Gy
Plan Total Fractions Prescribed: 28
Plan Total Prescribed Dose: 50.4 Gy
Reference Point Dosage Given to Date: 41.4 Gy
Reference Point Session Dosage Given: 1.8 Gy
Session Number: 23

## 2022-06-30 ENCOUNTER — Ambulatory Visit: Payer: 59 | Admitting: Radiation Oncology

## 2022-06-30 ENCOUNTER — Ambulatory Visit
Admission: RE | Admit: 2022-06-30 | Discharge: 2022-06-30 | Disposition: A | Discharge status: Home / Self Care | Payer: 59 | Source: Ambulatory Visit | Attending: Radiation Oncology | Admitting: Radiation Oncology

## 2022-06-30 ENCOUNTER — Other Ambulatory Visit: Payer: Self-pay

## 2022-06-30 ENCOUNTER — Encounter: Payer: Self-pay | Admitting: Oncology

## 2022-06-30 DIAGNOSIS — C50212 Malignant neoplasm of upper-inner quadrant of left female breast: Secondary | ICD-10-CM | POA: Diagnosis not present

## 2022-06-30 LAB — RAD ONC ARIA SESSION SUMMARY
Course Elapsed Days: 33
Plan Fractions Treated to Date: 24
Plan Prescribed Dose Per Fraction: 1.8 Gy
Plan Total Fractions Prescribed: 28
Plan Total Prescribed Dose: 50.4 Gy
Reference Point Dosage Given to Date: 43.2 Gy
Reference Point Session Dosage Given: 1.8 Gy
Session Number: 24

## 2022-07-01 ENCOUNTER — Other Ambulatory Visit: Payer: Self-pay

## 2022-07-01 ENCOUNTER — Ambulatory Visit
Admission: RE | Admit: 2022-07-01 | Discharge: 2022-07-01 | Disposition: A | Discharge status: Home / Self Care | Payer: 59 | Source: Ambulatory Visit | Attending: Radiation Oncology | Admitting: Radiation Oncology

## 2022-07-01 DIAGNOSIS — C50212 Malignant neoplasm of upper-inner quadrant of left female breast: Secondary | ICD-10-CM | POA: Diagnosis not present

## 2022-07-01 LAB — RAD ONC ARIA SESSION SUMMARY
Course Elapsed Days: 34
Plan Fractions Treated to Date: 25
Plan Prescribed Dose Per Fraction: 1.8 Gy
Plan Total Fractions Prescribed: 28
Plan Total Prescribed Dose: 50.4 Gy
Reference Point Dosage Given to Date: 45 Gy
Reference Point Session Dosage Given: 1.8 Gy
Session Number: 25

## 2022-07-02 ENCOUNTER — Ambulatory Visit
Admission: RE | Admit: 2022-07-02 | Discharge: 2022-07-02 | Disposition: A | Discharge status: Home / Self Care | Payer: 59 | Source: Ambulatory Visit | Attending: Radiation Oncology | Admitting: Radiation Oncology

## 2022-07-02 ENCOUNTER — Other Ambulatory Visit: Payer: Self-pay

## 2022-07-02 DIAGNOSIS — C50212 Malignant neoplasm of upper-inner quadrant of left female breast: Secondary | ICD-10-CM | POA: Diagnosis not present

## 2022-07-02 LAB — RAD ONC ARIA SESSION SUMMARY
Course Elapsed Days: 35
Plan Fractions Treated to Date: 26
Plan Prescribed Dose Per Fraction: 1.8 Gy
Plan Total Fractions Prescribed: 28
Plan Total Prescribed Dose: 50.4 Gy
Reference Point Dosage Given to Date: 46.8 Gy
Reference Point Session Dosage Given: 1.8 Gy
Session Number: 26

## 2022-07-03 ENCOUNTER — Other Ambulatory Visit: Payer: Self-pay

## 2022-07-03 ENCOUNTER — Ambulatory Visit
Admission: RE | Admit: 2022-07-03 | Discharge: 2022-07-03 | Disposition: A | Discharge status: Home / Self Care | Payer: 59 | Source: Ambulatory Visit | Attending: Radiation Oncology | Admitting: Radiation Oncology

## 2022-07-03 DIAGNOSIS — C50212 Malignant neoplasm of upper-inner quadrant of left female breast: Secondary | ICD-10-CM | POA: Diagnosis not present

## 2022-07-03 LAB — RAD ONC ARIA SESSION SUMMARY
Course Elapsed Days: 36
Plan Fractions Treated to Date: 27
Plan Prescribed Dose Per Fraction: 1.8 Gy
Plan Total Fractions Prescribed: 28
Plan Total Prescribed Dose: 50.4 Gy
Reference Point Dosage Given to Date: 48.6 Gy
Reference Point Session Dosage Given: 1.8 Gy
Session Number: 27

## 2022-07-06 ENCOUNTER — Other Ambulatory Visit: Payer: Self-pay

## 2022-07-06 ENCOUNTER — Ambulatory Visit: Payer: 59

## 2022-07-06 ENCOUNTER — Ambulatory Visit
Admission: RE | Admit: 2022-07-06 | Discharge: 2022-07-06 | Disposition: A | Discharge status: Home / Self Care | Payer: 59 | Source: Ambulatory Visit | Attending: Radiation Oncology | Admitting: Radiation Oncology

## 2022-07-06 ENCOUNTER — Encounter: Payer: Self-pay | Admitting: Radiation Oncology

## 2022-07-06 VITALS — Wt 221.1 lb

## 2022-07-06 DIAGNOSIS — Z483 Aftercare following surgery for neoplasm: Secondary | ICD-10-CM

## 2022-07-06 DIAGNOSIS — C50212 Malignant neoplasm of upper-inner quadrant of left female breast: Secondary | ICD-10-CM | POA: Diagnosis not present

## 2022-07-06 LAB — RAD ONC ARIA SESSION SUMMARY
Course Elapsed Days: 39
Plan Fractions Treated to Date: 28
Plan Prescribed Dose Per Fraction: 1.8 Gy
Plan Total Fractions Prescribed: 28
Plan Total Prescribed Dose: 50.4 Gy
Reference Point Dosage Given to Date: 50.4 Gy
Reference Point Session Dosage Given: 1.8 Gy
Session Number: 28

## 2022-07-06 NOTE — Therapy (Signed)
OUTPATIENT PHYSICAL THERAPY SOZO SCREENING NOTE   Patient Name: Maria Diaz MRN: [id number] DOB:1971/07/28, 51 y.o., female Today's Date: 07/06/2022  PCP: [name], MD REFERRING PROVIDER: Nicholas Lose, MD   PT End of Session - 07/06/22 0917     Visit Number 16   # unchanged due to screen only   PT Start Time 0916    PT Stop Time 0921    PT Time Calculation (min) 5 min    Activity Tolerance Patient tolerated treatment well    Behavior During Therapy [facility] for tasks assessed/performed             Past Medical History:  Diagnosis Date   Anemia    Anxiety    Depression    Glaucoma    Hives    chronic, on Xolair injections   Past Surgical History:  Procedure Laterality Date   BREAST BIOPSY Left 01/19/2022   BREAST CYST EXCISION Left 01/28/2021   Procedure: LEFT BREAST MASS EXCISION;  Surgeon: Rolm Bookbinder, MD;  Location: [place];  Service: General;  Laterality: Left;  START TIME OF 3:00 PM FOR 60 MINUTES IN ROOM 8   BREAST LUMPECTOMY WITH RADIOACTIVE SEED AND SENTINEL LYMPH NODE BIOPSY Left 04/13/2022   Procedure: LEFT BREAST BRACKETED LUMPECTOMY WITH RADIOACTIVE SEED AND AXILLARY SENTINEL LYMPH NODE BIOPSY;  Surgeon: Rolm Bookbinder, MD;  Location: [place];  Service: General;  Laterality: Left;   HYSTEROSCOPY W/ ENDOMETRIAL ABLATION  08/14/2020   [facility]   MASTOPEXY Bilateral 04/21/2022   Procedure: MASTOPEXY;  Surgeon: Irene Limbo, MD;  Location: [place];  Service: Plastics;  Laterality: Bilateral;   Patient Active Problem List   Diagnosis Date Noted   Anemia 12/31/2021   Malignant neoplasm of upper-inner quadrant of left breast in female, estrogen receptor positive ([place]) 12/31/2021   Open angle with borderline findings and low glaucoma risk in both eyes 01/11/2016   Chronic idiopathic urticaria 01/11/2016   Age-related nuclear cataract of both eyes 01/11/2016   Eczema 01/11/2016     REFERRING DIAG: left breast cancer at risk for lymphedema  THERAPY DIAG:  Aftercare following surgery for neoplasm  PERTINENT HISTORY: Patient was diagnosed on 12/09/21 with left grade 3. It measures 3 cm and is located in the upper inner quadrant. 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 04/13/22- underwent L breast lumpectomy and SLNB (0/3) 04/21/22- bilateral mastopexy     PRECAUTIONS: left UE Lymphedema risk, None  SUBJECTIVE: Pt returns for her 3 month L-Dex screen.   PAIN:  Are you having pain? No  SOZO SCREENING: Patient was assessed today using the SOZO machine to determine the lymphedema index score. This was compared to her baseline score. It was determined that she is within the recommended range when compared to her baseline and no further action is needed at this time. She will continue SOZO screenings. These are done every 3 months for 2 years post operatively followed by every 6 months for 2 years, and then annually.   L-DEX FLOWSHEETS - 07/06/22 0900       L-DEX LYMPHEDEMA SCREENING   Measurement Type Unilateral    L-DEX MEASUREMENT EXTREMITY Upper Extremity    POSITION  Standing    DOMINANT SIDE Right    At Risk Side Left    BASELINE SCORE (UNILATERAL) 1.3    L-DEX SCORE (UNILATERAL) 4.5    VALUE CHANGE (UNILAT) 3.2  Otelia Limes, PTA 07/06/2022, 9:20 AM

## 2022-07-07 ENCOUNTER — Ambulatory Visit: Payer: 59

## 2022-07-08 ENCOUNTER — Ambulatory Visit: Payer: 59

## 2022-07-09 ENCOUNTER — Ambulatory Visit: Payer: 59

## 2022-07-10 ENCOUNTER — Ambulatory Visit: Payer: 59

## 2022-07-10 ENCOUNTER — Telehealth (INDEPENDENT_AMBULATORY_CARE_PROVIDER_SITE_OTHER): Payer: 59 | Admitting: Psychiatry

## 2022-07-10 DIAGNOSIS — F4310 Post-traumatic stress disorder, unspecified: Secondary | ICD-10-CM

## 2022-07-10 DIAGNOSIS — F3342 Major depressive disorder, recurrent, in full remission: Secondary | ICD-10-CM
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
## 2022-07-13 ENCOUNTER — Ambulatory Visit: Payer: 59

## 2022-07-13 ENCOUNTER — Encounter: Payer: Self-pay | Admitting: *Deleted

## 2022-07-14 ENCOUNTER — Ambulatory Visit: Payer: 59

## 2022-07-15 ENCOUNTER — Ambulatory Visit: Payer: 59

## 2022-07-20 ENCOUNTER — Telehealth: Payer: Self-pay | Admitting: *Deleted

## 2022-07-20 NOTE — Telephone Encounter (Signed)
Connected with TORIANA SPONSEL [phone]) regarding ADL's for return to work form.  Request to work remotely 08/20/2022 through 11/22/2022. "Busy in the middle of something right now.  Can you call me back in about 45 mins."

## 2022-07-20 NOTE — Telephone Encounter (Addendum)
Connected with Maria Diaz.  Reports she does have "trouble sleeping, diarrhea & occasional fecal incontinence, decreased R.O.M. experiencing left arm tingling, breasts still feel raw or sharp pains but I use only Tylenol for pain, fatigue, unable to drive distances so unable to drive the one hour drive to work.  Remain at home due to fatigue.  Mother prepares my meals and cleans home for home maintenance.  Unable to perform anti-lymphedema exercises at this time.  On medications and trouble breathing with COVID last week. (See 07/13/2022 telephone encounter note [facility])     Return to work form completed with above information provided above.  To provider in basket for review, signature and return.

## 2022-07-22 NOTE — Telephone Encounter (Signed)
Late entry for 07/21/2022: Paperwork returned and received by this nurse today from collaborative signed by provider.   Successfully returned to employer, [employer] via fax.  Original to mail bin addressed to [employer] address on file. [address] [place] [address] Process completed with copy of [employer] form to [employer] bin designated for items to be scanned completes process.  No further instructions received, actions required or performed by this nurse.

## 2022-07-23 ENCOUNTER — Ambulatory Visit (INDEPENDENT_AMBULATORY_CARE_PROVIDER_SITE_OTHER): Payer: 59 | Admitting: Psychiatry

## 2022-07-23 DIAGNOSIS — F4310 Post-traumatic stress disorder, unspecified: Secondary | ICD-10-CM | POA: Diagnosis not present

## 2022-07-23 DIAGNOSIS — F3342 Major depressive disorder, recurrent, in full remission: Secondary | ICD-10-CM
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
## 2022-07-24 ENCOUNTER — Encounter: Payer: Self-pay | Admitting: Psychiatry

## 2022-07-24 ENCOUNTER — Telehealth (INDEPENDENT_AMBULATORY_CARE_PROVIDER_SITE_OTHER): Payer: 59 | Admitting: Psychiatry

## 2022-07-24 DIAGNOSIS — F4310 Post-traumatic stress disorder, unspecified: Secondary | ICD-10-CM

## 2022-07-24 DIAGNOSIS — F3341 Major depressive disorder, recurrent, in partial remission: Secondary | ICD-10-CM | POA: Diagnosis not present

## 2022-07-24 DIAGNOSIS — G4700 Insomnia, unspecified: Secondary | ICD-10-CM
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
## 2022-07-24 NOTE — Patient Instructions (Signed)
Continue sertraline 200 mg daily  Continue hydroxyzine 25 mg daily as needed for anxiety  Continue prazosin 2 mg at night Increase Trazodone 50-100 mg at night  Next appointment- 11/3 at 10 AM

## 2022-08-03 ENCOUNTER — Encounter: Payer: Self-pay | Admitting: Radiation Oncology

## 2022-08-05 NOTE — Progress Notes (Incomplete)
  Radiation Oncology         [phone] ________________________________  Patient Name: Maria Diaz MRN: [id number] DOB: 07-06-1971 Referring Physician: Chapman Fitch Date of Service: 07/06/2022 [facility], [place]                                                        End Of Treatment Note  Diagnoses: C50.212-Malignant neoplasm of upper-inner quadrant of left female breast  Cancer Staging: S/p chemotherapy and lumpectomy: No residual carcinoma    Stage IIB (cT2, cN0, cM0) Left Breast UIQ, Invasive Ductal Carcinoma, ER+ / PR- / Her2-, Grade 3, s/p neoadjuvant chemotherapy    Cancer Staging  Malignant neoplasm of upper-inner quadrant of left breast in female, estrogen receptor positive ([place]) Staging form: Breast, AJCC 8th Edition - Clinical: Stage IIB (cT2, cN0, cM0, G3, ER+, PR-, HER2-) - Unsigned  Intent: Curative  Radiation Treatment Dates: 05/28/2022 through 07/06/2022 Site Technique Total Dose (Gy) Dose per Fx (Gy) Completed Fx Beam Energies  Breast, Left: Breast_L 3D 50.4/50.4 1.8 28/28 10XFFF   Narrative: The patient tolerated radiation therapy relatively well. On the date of her final treatment, the patient reported left breast pain rated at a 5/10, fatigue, and tightness and redness to the left breast. Physical exam performed on that same date revealed hyperpigmentation changes and diffuse erythema to the left breast area. No skin breakdown was appreciated. Reconstruction scars were also seen to be intact.   Plan: The patient will follow-up with radiation oncology in one month .  ________________________________________________ -----------------------------------  Blair Promise, PhD, MD  This document serves as a record of services personally performed by Gery Pray, MD. It was created on his behalf by Roney Mans, a trained medical scribe. The creation of this record is based on the scribe's personal observations and the provider's statements  to them. This document has been checked and approved by the attending provider.

## 2022-08-05 NOTE — Progress Notes (Signed)
Radiation Oncology         [phone]) [phone] ________________________________  Name: Maria Diaz MRN: [id number]  Date: 08/06/2022  DOB: 10/06/1971  Follow-Up Visit Note  CC: Maria Rio, MD  Maria Rio, MD    ICD-10-CM   1. Malignant neoplasm of upper-inner quadrant of left breast in female, estrogen receptor positive ([place])  C50.212 Ambulatory referral to Physical Therapy   Z17.0       Diagnosis: S/p chemotherapy and lumpectomy: No residual carcinoma    Stage IIB (cT2, cN0, cM0) Left Breast UIQ, Invasive Ductal Carcinoma, ER+ / PR- / Her2-, Grade 3, s/p neoadjuvant chemotherapy    Cancer Staging  Malignant neoplasm of upper-inner quadrant of left breast in female, estrogen receptor positive ([place]) Staging form: Breast, AJCC 8th Edition - Clinical: Stage IIB (cT2, cN0, cM0, G3, ER+, PR-, HER2-) - Unsigned  Interval Since Last Radiation: 1 month   Intent: Curative  Radiation Treatment Dates: 05/28/2022 through 07/06/2022 Site Technique Total Dose (Gy) Dose per Fx (Gy) Completed Fx Beam Energies  Breast, Left: Breast_L 3D 50.4/50.4 1.8 28/28 10XFFF    Narrative:  The patient returns today for routine follow-up. The patient tolerated radiation therapy relatively well. On the date of her final treatment, the patient reported left breast pain rated at a 5/10, fatigue, and tightness and redness to the left breast. Physical exam performed on that same date revealed hyperpigmentation changes and diffuse erythema to the left breast area. No skin breakdown was appreciated. Reconstruction scars were also seen to be intact.          Since the patient was last seen for re-evaluation, the patient followed up with plastic surgery on 06/01/22. During which time, some hyperpigmentation was noted on the left breast. Otherwise, the patient denied any concerns.   The patient also followed up with Dr. Chryl Heck on 06/19/22. During which time, the patient denied any new complaints. The patient  also consented to beginning tamoxifen 2-3 weeks after her final radiation treatment.   On evaluation today the patient reports persistent pain within the left breast.  She has been taking Tylenol and Aleve but still has difficulty sleeping at night given her breast pain.  She denies any nipple discharge or bleeding.                     Allergies:  is allergic to haemophilus influenzae vaccines and penicillins.  Meds: Current Outpatient Medications  Medication Sig Dispense Refill   atorvastatin (LIPITOR) 10 MG tablet Take 10 mg by mouth daily.     EPINEPHrine 0.3 mg/0.3 mL IJ SOAJ injection Inject 0.3 mg into the muscle as needed. For severe allergy     fluticasone (FLONASE) 50 MCG/ACT nasal spray Place 2 sprays into the nose daily as needed.     hydrOXYzine (ATARAX) 25 MG tablet Take 1 tablet (25 mg total) by mouth daily as needed for anxiety. 90 tablet 0   medroxyPROGESTERone (PROVERA) 10 MG tablet Take 10 mg by mouth daily.     omalizumab (XOLAIR) 150 MG injection Inject 300 mg into the skin every 6 (six) weeks. Dues sept 2     prazosin (MINIPRESS) 2 MG capsule Take 1 capsule (2 mg total) by mouth at bedtime. 90 capsule 1   sertraline (ZOLOFT) 100 MG tablet Take 2 tablets (200 mg total) by mouth daily. 180 tablet 1   tamoxifen (NOLVADEX) 20 MG tablet Take 1 tablet (20 mg total) by mouth daily. 90 tablet 3  traMADol (ULTRAM) 50 MG tablet Take 1 tablet (50 mg total) by mouth every 6 (six) hours as needed for moderate pain. 30 tablet 0   traZODone (DESYREL) 50 MG tablet Take 0.5-1 tablets (25-50 mg total) by mouth at bedtime as needed for sleep. 90 tablet 0   oxyCODONE (ROXICODONE) 5 MG immediate release tablet Take 1 tablet (5 mg total) by mouth every 4 (four) hours as needed. (Patient not taking: Reported on 08/06/2022) 15 tablet 0   No current facility-administered medications for this encounter.    Physical Findings: The patient is in no acute distress. Patient is alert and oriented.   height is 5' 7" (1.702 m) and weight is 217 lb (98.4 kg). Her oral temperature is 98.3 F (36.8 C). Her blood pressure is 104/67 and her pulse is 88. Her respiration is 18 and oxygen saturation is 97%. .  No significant changes. Lungs are clear to auscultation bilaterally. Heart has regular rate and rhythm. No palpable cervical, supraclavicular, or axillary adenopathy. Abdomen soft, non-tender, normal bowel sounds.  Right Breast: no palpable mass, nipple discharge or bleeding.  Breast reduction scars noted. Left Breast: Some hyperpigmentation changes noted.  Surgical scars well-healed.  Some edema is noted in the breast region.  No dominant mass appreciated in the breast nipple discharge or bleeding.   Lab Findings: Lab Results  Component Value Date   WBC 12.1 (H) 03/12/2022   HGB 12.7 03/12/2022   HCT 37.8 03/12/2022   MCV 91.5 03/12/2022   PLT 314 03/12/2022    Radiographic Findings: No results found.  Impression: S/p chemotherapy and lumpectomy: No residual carcinoma    Stage IIB (cT2, cN0, cM0) Left Breast UIQ, Invasive Ductal Carcinoma, ER+ / PR- / Her2-, Grade 3, s/p neoadjuvant chemotherapy   The patient is recovering from the effects of radiation.  She continues to have significant pain at in the left breast which is interfering with her sleep habits.  She does have some persistent edema in the breast and I recommended she proceed with physical therapy evaluation again.  She is agreeable to this recommendation.  In light of her pain and difficulties with sleeping at night I will give her tramadol primarily to take in the evening hours.  Hopefully with time her breast pain will resolve.  Plan: Routine follow-up in 3 months.  Physical therapy evaluation as above    ____________________________________  Blair Promise, PhD, MD  This document serves as a record of services personally performed by Gery Pray, MD. It was created on his behalf by Roney Mans, a trained medical  scribe. The creation of this record is based on the scribe's personal observations and the provider's statements to them. This document has been checked and approved by the attending provider.

## 2022-08-06 ENCOUNTER — Encounter: Payer: Self-pay | Admitting: Radiation Oncology

## 2022-08-06 ENCOUNTER — Ambulatory Visit
Admission: RE | Admit: 2022-08-06 | Discharge: 2022-08-06 | Disposition: A | Discharge status: Home / Self Care | Payer: 59 | Source: Ambulatory Visit | Attending: Hematology and Oncology | Admitting: Hematology and Oncology

## 2022-08-06 DIAGNOSIS — Z170 Estrogen receptor positive status [ER+]: Secondary | ICD-10-CM | POA: Diagnosis present

## 2022-08-06 DIAGNOSIS — C50212 Malignant neoplasm of upper-inner quadrant of left female breast: Secondary | ICD-10-CM | POA: Diagnosis present

## 2022-08-06 HISTORY — DX: Personal history of irradiation: Z92.3

## 2022-08-06 MED ORDER — TRAMADOL HCL 50 MG PO TABS: 50.0000 mg | ORAL_TABLET | Freq: Four times a day (QID) | ORAL | 0 refills | Status: DC | PRN

## 2022-08-06 NOTE — Progress Notes (Signed)
Maria Diaz is here today for follow up post radiation to the breast.   Breast Side:Left   They completed their radiation on: 07/06/22   Does the patient complain of any of the following: Post radiation skin issues: Patient states skin is improving.  Breast Tenderness: Patient reports having pain to left breast and left arm rating 5/10. Patient taking Ibuprofen and tylenol. Patient states pain is constant .  Breast Swelling: Mild Lymphadema: No Range of Motion limitations: Yes, patient reports having a pulling sensation when lifting left arm.  Fatigue post radiation: Continues to have a low energy level.  Appetite good/fair/poor: Good.   Additional comments if applicable:

## 2022-08-10 NOTE — Therapy (Signed)
OUTPATIENT PHYSICAL THERAPY ONCOLOGY EVALUATION  Patient Name: Maria Diaz MRN: [id number] DOB:1970/12/18, 51 y.o., female Today's Date: 08/12/2022   PT End of Session - 08/12/22 1050     Visit Number 1    Number of Visits 9    Date for PT Re-Evaluation 09/09/22    PT Start Time 1008    PT Stop Time 8592    PT Time Calculation (min) 39 min    Activity Tolerance Patient tolerated treatment well    Behavior During Therapy [facility] for tasks assessed/performed             Past Medical History:  Diagnosis Date   Anemia    Anxiety    Depression    Glaucoma    History of radiation therapy    Left breast 05/28/22-07/06/22-Dr. Gery Pray   Hives    chronic, on Xolair injections   Past Surgical History:  Procedure Laterality Date   BREAST BIOPSY Left 01/19/2022   BREAST CYST EXCISION Left 01/28/2021   Procedure: LEFT BREAST MASS EXCISION;  Surgeon: Rolm Bookbinder, MD;  Location: [place];  Service: General;  Laterality: Left;  START TIME OF 3:00 PM FOR 60 MINUTES IN ROOM 8   BREAST LUMPECTOMY WITH RADIOACTIVE SEED AND SENTINEL LYMPH NODE BIOPSY Left 04/13/2022   Procedure: LEFT BREAST BRACKETED LUMPECTOMY WITH RADIOACTIVE SEED AND AXILLARY SENTINEL LYMPH NODE BIOPSY;  Surgeon: Rolm Bookbinder, MD;  Location: [place];  Service: General;  Laterality: Left;   HYSTEROSCOPY W/ ENDOMETRIAL ABLATION  08/14/2020   [facility]   MASTOPEXY Bilateral 04/21/2022   Procedure: MASTOPEXY;  Surgeon: Irene Limbo, MD;  Location: [place];  Service: Plastics;  Laterality: Bilateral;   Patient Active Problem List   Diagnosis Date Noted   Anemia 12/31/2021   Malignant neoplasm of upper-inner quadrant of left breast in female, estrogen receptor positive ([place]) 12/31/2021   Open angle with borderline findings and low glaucoma risk in both eyes 01/11/2016   Chronic idiopathic urticaria 01/11/2016   Age-related nuclear cataract of both eyes  01/11/2016   Eczema 01/11/2016    PCP: [name], MD  REFERRING PROVIDER: Gery Pray, MD  REFERRING DIAG: C50.212,Z17.0 (ICD-10-CM) - Malignant neoplasm of upper-inner quadrant of left breast in female, estrogen receptor positive ([place])  THERAPY DIAG:  Lymphedema, not elsewhere classified  Stiffness of left shoulder, not elsewhere classified  Disorder of the skin and subcutaneous tissue related to radiation, unspecified  Malignant neoplasm of upper-inner quadrant of left breast in female, estrogen receptor positive ([place])  ONSET DATE: 12/09/21  Rationale for Evaluation and Treatment Rehabilitation  SUBJECTIVE  SUBJECTIVE STATEMENT: My breast has been swelling more since radiation. The whole left side bothers me. I am having trouble sleeping at night due to the pain.  PERTINENT HISTORY:  Patient was diagnosed on 12/09/21 with left grade 3. It measures 3 cm and is located in the upper inner quadrant. 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 04/13/22- underwent L breast lumpectomy and SLNB (0/3) 04/21/22- bilateral mastopexy  PAIN:  Are you having pain? No pt reports at night the pain is up to a 6 or 7/10 in the L axilla and UE  PRECAUTIONS: Other: at risk of lymphedema  WEIGHT BEARING RESTRICTIONS No  FALLS:  Has patient fallen in last 6 months? 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  LEISURE: pt has been doing PT exercises 2x/wk and on the other days pt walks  HAND DOMINANCE : right   PRIOR LEVEL OF FUNCTION: Independent  PATIENT GOALS to avoid lymphedema, decrease  pain   OBJECTIVE  COGNITION:  Overall cognitive status: Within functional limits for tasks assessed   PALPATION: 1 cord palpable in left lateral breast, fibrosis in L axilla  OBSERVATIONS / OTHER ASSESSMENTS: fullness in L breast, fullness and fibrosis in L axilla, 1 cord extending from scar in L lateral breast extending to axilla  POSTURE: forward head, rounded shoulders  UPPER EXTREMITY AROM/PROM:  A/PROM RIGHT   eval   Shoulder extension 62  Shoulder flexion 155  Shoulder abduction 177  Shoulder internal rotation 70  Shoulder external rotation 88    (Blank rows = not tested)  A/PROM LEFT   eval  Shoulder extension 57  Shoulder flexion 160 with p!  Shoulder abduction 125  Shoulder internal rotation 60  Shoulder external rotation 90    (Blank rows = not tested)    LYMPHEDEMA ASSESSMENTS:   SURGERY TYPE/DATE: 04/13/22 L lumpectomy and SLNB  NUMBER OF LYMPH NODES REMOVED: 0/3  CHEMOTHERAPY: Completed  RADIATION:completed  HORMONE TREATMENT: tamoxifen - currently taking  INFECTIONS: none  LYMPHEDEMA ASSESSMENTS:   LANDMARK RIGHT  eval  10 cm proximal to olecranon process 38  Olecranon process 31.5  10 cm proximal to ulnar styloid process 24  Just proximal to ulnar styloid process 18.5  Across hand at thumb web space 20.2  At base of 2nd digit 7  (Blank rows = not tested)  LANDMARK LEFT  eval  10 cm proximal to olecranon process 38  Olecranon process 31.5  10 cm proximal to ulnar styloid process 25  Just proximal to ulnar styloid process 19  Across hand at thumb web space 20  At base of 2nd digit 6.8  (Blank rows = not tested)   QUICK DASH SURVEY:   Katina Dung - 08/12/22 0001     Open a tight or new jar Moderate difficulty    Do heavy household chores (wash walls, wash floors) Severe difficulty    Carry a shopping bag or briefcase Moderate difficulty    Wash your back Moderate difficulty    Use a knife to cut food Mild difficulty     Recreational activities in which you take some force or impact through your arm, shoulder, or hand (golf, hammering, tennis) Severe difficulty    During the past week, to what extent has your arm, shoulder or hand problem interfered with  your normal social activities with family, friends, neighbors, or groups? Quite a bit    During the past week, to what extent has your arm, shoulder or hand problem limited your work or other regular daily activities Modererately    Arm, shoulder, or hand pain. Moderate    Tingling (pins and needles) in your arm, shoulder, or hand Moderate    Difficulty Sleeping Moderate difficulty    DASH Score 54.55 %               TODAY'S TREATMENT  08/12/22: MFR to cording extending from lumpectomy scar through L lateral breast and in to axilla, PROM to L shoulder with pt able to achieve full PROM by end of session and cord not visible until end ROM  PATIENT EDUCATION:  Education details: cording and importance of stretching to reduce cording, L breast lymphedema vs. Post op radiation edema, vs edema from cording Person educated: Patient Education method: Explanation Education comprehension: verbalized understanding   HOME EXERCISE PROGRAM: Stretch at end range and massage cording  ASSESSMENT:  CLINICAL IMPRESSION: Patient is a 51 y.o. female who was seen today for physical therapy evaluation and treatment for L breast and axillary swelling, L upper quadrant pain and cording in L breast and axilla. Pt completed radiation a month ago and since that time has had trouble sleeping at night due to pain and discomfort extending from her breast to her upper arm. There is a cord that is visible extending from her lumpectomy scar through her left lateral breast extending to her axilla. She has limited L shoulder ROM especially in to abduction. She also has edema in her L breast and her L breast is more full and tight when compared to the R side. Pt would benefit from  skilled PT services to decrease swelling, decrease pain, decrease cording and improve L shoulder ROM.    OBJECTIVE IMPAIRMENTS decreased ROM, increased edema, increased fascial restrictions, impaired UE functional use, postural dysfunction, and pain.   ACTIVITY LIMITATIONS carrying, lifting, and reach over head  PARTICIPATION LIMITATIONS:  none  PERSONAL FACTORS  none  are also affecting patient's functional outcome.   REHAB POTENTIAL: Good  CLINICAL DECISION MAKING: Stable/uncomplicated  EVALUATION COMPLEXITY: Low  GOALS: Goals reviewed with patient? Yes  SHORT TERM GOALS=LONG TERM GOALS  Target date: 09/09/2022    Pt will be independent in self MLD for long term management of L breast edema.  Baseline: Goal status: INITIAL  2.  Pt will report a 75% improvement in pain in L breast, axilla and upper arm to allow improved comfort.  Baseline:  Goal status: INITIAL  3.  Pt will demonstrate 175 degrees of L shoulder abduction to allow her to reach out to the side.  Baseline: 125 Goal status: INITIAL  4.  Pt will be independent in a home exercise program for long term stretching and strengthening.  Baseline:  Goal status: INITIAL    PLAN: PT FREQUENCY: 2x/week  PT DURATION: 4 weeks  PLANNED INTERVENTIONS: Therapeutic exercises, Therapeutic activity, Patient/Family education, Self Care, Joint mobilization, Manual lymph drainage, Compression bandaging, scar mobilization, Taping, Vasopneumatic device, and Manual therapy  PLAN FOR NEXT SESSION: MLD to L breast, MFR to cording in L lateral breast and axilla, MLD to L axilla, PROM to L shoulder, pulleys, ball   [employer], PT 08/12/2022, 10:52 AM

## 2022-08-12 ENCOUNTER — Other Ambulatory Visit: Payer: Self-pay

## 2022-08-12 ENCOUNTER — Encounter: Payer: Self-pay | Admitting: Physical Therapy

## 2022-08-12 ENCOUNTER — Ambulatory Visit: Payer: 59 | Attending: Radiation Oncology | Admitting: Physical Therapy

## 2022-08-12 DIAGNOSIS — M25612 Stiffness of left shoulder, not elsewhere classified: Secondary | ICD-10-CM | POA: Diagnosis present

## 2022-08-12 DIAGNOSIS — Z170 Estrogen receptor positive status [ER+]: Secondary | ICD-10-CM | POA: Diagnosis not present

## 2022-08-12 DIAGNOSIS — L599 Disorder of the skin and subcutaneous tissue related to radiation, unspecified: Secondary | ICD-10-CM | POA: Insufficient documentation

## 2022-08-12 DIAGNOSIS — C50212 Malignant neoplasm of upper-inner quadrant of left female breast: Secondary | ICD-10-CM | POA: Diagnosis not present

## 2022-08-12 DIAGNOSIS — Z923 Personal history of irradiation: Secondary | ICD-10-CM | POA: Insufficient documentation

## 2022-08-12 DIAGNOSIS — I890 Lymphedema, not elsewhere classified: Secondary | ICD-10-CM | POA: Insufficient documentation

## 2022-08-17 ENCOUNTER — Ambulatory Visit (INDEPENDENT_AMBULATORY_CARE_PROVIDER_SITE_OTHER): Payer: 59 | Admitting: Psychiatry

## 2022-08-17 ENCOUNTER — Ambulatory Visit: Payer: 59

## 2022-08-17 DIAGNOSIS — C50212 Malignant neoplasm of upper-inner quadrant of left female breast: Secondary | ICD-10-CM

## 2022-08-17 DIAGNOSIS — I890 Lymphedema, not elsewhere classified: Secondary | ICD-10-CM

## 2022-08-17 DIAGNOSIS — F4310 Post-traumatic stress disorder, unspecified: Secondary | ICD-10-CM

## 2022-08-17 DIAGNOSIS — L599 Disorder of the skin and subcutaneous tissue related to radiation, unspecified: Secondary | ICD-10-CM

## 2022-08-17 DIAGNOSIS — F3341 Major depressive disorder, recurrent, in partial remission: Secondary | ICD-10-CM

## 2022-08-17 DIAGNOSIS — M25612 Stiffness of left shoulder, not elsewhere classified: Secondary | ICD-10-CM

## 2022-08-17 NOTE — Therapy (Signed)
OUTPATIENT PHYSICAL THERAPY ONCOLOGY TREATMENT  Patient Name: Maria Diaz MRN: [id number] DOB:11/10/1971, 51 y.o., female Today's Date: 08/17/2022   PT End of Session - 08/17/22 1212     Visit Number 2    Number of Visits 9    Date for PT Re-Evaluation 09/09/22    PT Start Time 1210    PT Stop Time 1303    PT Time Calculation (min) 53 min    Activity Tolerance Patient tolerated treatment well    Behavior During Therapy [facility] for tasks assessed/performed             Past Medical History:  Diagnosis Date   Anemia    Anxiety    Depression    Glaucoma    History of radiation therapy    Left breast 05/28/22-07/06/22-Dr. Gery Pray   Hives    chronic, on Xolair injections   Past Surgical History:  Procedure Laterality Date   BREAST BIOPSY Left 01/19/2022   BREAST CYST EXCISION Left 01/28/2021   Procedure: LEFT BREAST MASS EXCISION;  Surgeon: Rolm Bookbinder, MD;  Location: [place];  Service: General;  Laterality: Left;  START TIME OF 3:00 PM FOR 60 MINUTES IN ROOM 8   BREAST LUMPECTOMY WITH RADIOACTIVE SEED AND SENTINEL LYMPH NODE BIOPSY Left 04/13/2022   Procedure: LEFT BREAST BRACKETED LUMPECTOMY WITH RADIOACTIVE SEED AND AXILLARY SENTINEL LYMPH NODE BIOPSY;  Surgeon: Rolm Bookbinder, MD;  Location: [place];  Service: General;  Laterality: Left;   HYSTEROSCOPY W/ ENDOMETRIAL ABLATION  08/14/2020   [facility]   MASTOPEXY Bilateral 04/21/2022   Procedure: MASTOPEXY;  Surgeon: Irene Limbo, MD;  Location: [place];  Service: Plastics;  Laterality: Bilateral;   Patient Active Problem List   Diagnosis Date Noted   Anemia 12/31/2021   Malignant neoplasm of upper-inner quadrant of left breast in female, estrogen receptor positive ([place]) 12/31/2021   Open angle with borderline findings and low glaucoma risk in both eyes 01/11/2016   Chronic idiopathic urticaria 01/11/2016   Age-related nuclear cataract of both eyes  01/11/2016   Eczema 01/11/2016    PCP: [name], MD  REFERRING PROVIDER: Gery Pray, MD  REFERRING DIAG: C50.212,Z17.0 (ICD-10-CM) - Malignant neoplasm of upper-inner quadrant of left breast in female, estrogen receptor positive ([place])  THERAPY DIAG:  Lymphedema, not elsewhere classified  Stiffness of left shoulder, not elsewhere classified  Disorder of the skin and subcutaneous tissue related to radiation, unspecified  Malignant neoplasm of upper-inner quadrant of left breast in female, estrogen receptor positive ([place])  ONSET DATE: 12/09/21  Rationale for Evaluation and Treatment Rehabilitation  SUBJECTIVE  SUBJECTIVE STATEMENT: I took a pain pill so I'm feeling okay right now.    PERTINENT HISTORY:  Patient was diagnosed on 12/09/21 with left grade 3. It measures 3 cm and is located in the upper inner quadrant. 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 04/13/22- underwent L breast lumpectomy and SLNB (0/3) 04/21/22- bilateral mastopexy  PAIN:  Are you having pain? No pt reports at night the pain is up to a 6 or 7/10 in the L axilla and UE  PRECAUTIONS: Other: at risk of lymphedema  WEIGHT BEARING RESTRICTIONS No  FALLS:  Has patient fallen in last 6 months? 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  LEISURE: pt has been doing PT exercises 2x/wk and on the other days pt walks  HAND DOMINANCE : right   PRIOR LEVEL OF FUNCTION: Independent  PATIENT GOALS to avoid lymphedema, decrease pain   OBJECTIVE  COGNITION:  Overall cognitive status: Within functional limits for tasks  assessed   PALPATION: 1 cord palpable in left lateral breast, fibrosis in L axilla  OBSERVATIONS / OTHER ASSESSMENTS: fullness in L breast, fullness and fibrosis in L axilla, 1 cord extending from scar in L lateral breast extending to axilla  POSTURE: forward head, rounded shoulders  UPPER EXTREMITY AROM/PROM:  A/PROM RIGHT   eval   Shoulder extension 62  Shoulder flexion 155  Shoulder abduction 177  Shoulder internal rotation 70  Shoulder external rotation 88    (Blank rows = not tested)  A/PROM LEFT   eval  Shoulder extension 57  Shoulder flexion 160 with p!  Shoulder abduction 125  Shoulder internal rotation 60  Shoulder external rotation 90    (Blank rows = not tested)    LYMPHEDEMA ASSESSMENTS:   SURGERY TYPE/DATE: 04/13/22 L lumpectomy and SLNB  NUMBER OF LYMPH NODES REMOVED: 0/3  CHEMOTHERAPY: Completed  RADIATION:completed  HORMONE TREATMENT: tamoxifen - currently taking  INFECTIONS: none  LYMPHEDEMA ASSESSMENTS:   LANDMARK RIGHT  eval  10 cm proximal to olecranon process 38  Olecranon process 31.5  10 cm proximal to ulnar styloid process 24  Just proximal to ulnar styloid process 18.5  Across hand at thumb web space 20.2  At base of 2nd digit 7  (Blank rows = not tested)  LANDMARK LEFT  eval  10 cm proximal to olecranon process 38  Olecranon process 31.5  10 cm proximal to ulnar styloid process 25  Just proximal to ulnar styloid process 19  Across hand at thumb web space 20  At base of 2nd digit 6.8  (Blank rows = not tested)   QUICK DASH SURVEY:       TODAY'S TREATMENT  08/17/22: Manual Therapy MLD: In Supine: Short neck, 5 diaphragmatic breaths, Lt inguinal and Rt axillary nodes, Lt axillo-inguinal and anterior int-er axillary anastomosis, then focused on Lt inferior breast where some fibrosis palpable along with scar tissue MFR to cording extending from lumpectomy scar through L lateral breast and in to axilla PROM to L shoulder  with pt able to achieve full PROM by end of session and cord not visible until end ROM STM: Scar tissue mobs along incision inferior to Lt breast  08/12/22: MFR to cording extending from lumpectomy scar through L lateral breast and in to axilla, PROM to L shoulder with pt  able to achieve full PROM by end of session and cord not visible until end ROM  PATIENT EDUCATION:  Education details: cording and importance of stretching to reduce cording, L breast lymphedema vs. Post op radiation edema, vs edema from cording Person educated: Patient Education method: Explanation Education comprehension: verbalized understanding   HOME EXERCISE PROGRAM: Stretch at end range and massage cording  ASSESSMENT:  CLINICAL IMPRESSION: Continued with manual therapy focusing on cording at Lt lateral breast and also included MLD to breast as well. Some fibrosis with scar tissue palpable at inferior aspect of breast. Also continued with end P/ROM stretching which is where cording in breast was most visible.    OBJECTIVE IMPAIRMENTS decreased ROM, increased edema, increased fascial restrictions, impaired UE functional use, postural dysfunction, and pain.   ACTIVITY LIMITATIONS carrying, lifting, and reach over head  PARTICIPATION LIMITATIONS:  none  PERSONAL FACTORS  none  are also affecting patient's functional outcome.   REHAB POTENTIAL: Good  CLINICAL DECISION MAKING: Stable/uncomplicated  EVALUATION COMPLEXITY: Low  GOALS: Goals reviewed with patient? Yes  SHORT TERM GOALS=LONG TERM GOALS  Target date: 09/14/2022    Pt will be independent in self MLD for long term management of L breast edema.  Baseline: Goal status: INITIAL  2.  Pt will report a 75% improvement in pain in L breast, axilla and upper arm to allow improved comfort.  Baseline:  Goal status: INITIAL  3.  Pt will demonstrate 175 degrees of L shoulder abduction to allow her to reach out to the side.  Baseline: 125 Goal status:  INITIAL  4.  Pt will be independent in a home exercise program for long term stretching and strengthening.  Baseline:  Goal status: INITIAL    PLAN: PT FREQUENCY: 2x/week  PT DURATION: 4 weeks  PLANNED INTERVENTIONS: Therapeutic exercises, Therapeutic activity, Patient/Family education, Self Care, Joint mobilization, Manual lymph drainage, Compression bandaging, scar mobilization, Taping, Vasopneumatic device, and Manual therapy  PLAN FOR NEXT SESSION: Cont MLD to L breast, MFR to cording in L lateral breast and axilla, MLD to L axilla, PROM to L shoulder, pulleys, ball   Otelia Limes, PTA 08/17/2022, 1:23 PM
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
## 2022-08-19 ENCOUNTER — Ambulatory Visit: Payer: 59 | Admitting: Physical Therapy

## 2022-08-19 ENCOUNTER — Encounter: Payer: Self-pay | Admitting: Physical Therapy

## 2022-08-19 DIAGNOSIS — I890 Lymphedema, not elsewhere classified: Secondary | ICD-10-CM

## 2022-08-19 DIAGNOSIS — L599 Disorder of the skin and subcutaneous tissue related to radiation, unspecified: Secondary | ICD-10-CM

## 2022-08-19 DIAGNOSIS — Z170 Estrogen receptor positive status [ER+]: Secondary | ICD-10-CM

## 2022-08-19 DIAGNOSIS — M25612 Stiffness of left shoulder, not elsewhere classified: Secondary | ICD-10-CM

## 2022-08-19 NOTE — Therapy (Signed)
OUTPATIENT PHYSICAL THERAPY ONCOLOGY TREATMENT  Patient Name: Maria Diaz MRN: [id number] DOB:1970-12-24, 51 y.o., female Today's Date: 08/19/2022   PT End of Session - 08/19/22 1007     Visit Number 3    Number of Visits 9    Date for PT Re-Evaluation 09/09/22    PT Start Time 1004    PT Stop Time 1053    PT Time Calculation (min) 49 min    Activity Tolerance Patient tolerated treatment well    Behavior During Therapy [facility] for tasks assessed/performed             Past Medical History:  Diagnosis Date   Anemia    Anxiety    Depression    Glaucoma    History of radiation therapy    Left breast 05/28/22-07/06/22-Dr. Gery Pray   Hives    chronic, on Xolair injections   Past Surgical History:  Procedure Laterality Date   BREAST BIOPSY Left 01/19/2022   BREAST CYST EXCISION Left 01/28/2021   Procedure: LEFT BREAST MASS EXCISION;  Surgeon: Rolm Bookbinder, MD;  Location: [place];  Service: General;  Laterality: Left;  START TIME OF 3:00 PM FOR 60 MINUTES IN ROOM 8   BREAST LUMPECTOMY WITH RADIOACTIVE SEED AND SENTINEL LYMPH NODE BIOPSY Left 04/13/2022   Procedure: LEFT BREAST BRACKETED LUMPECTOMY WITH RADIOACTIVE SEED AND AXILLARY SENTINEL LYMPH NODE BIOPSY;  Surgeon: Rolm Bookbinder, MD;  Location: [place];  Service: General;  Laterality: Left;   HYSTEROSCOPY W/ ENDOMETRIAL ABLATION  08/14/2020   [facility]   MASTOPEXY Bilateral 04/21/2022   Procedure: MASTOPEXY;  Surgeon: Irene Limbo, MD;  Location: [place];  Service: Plastics;  Laterality: Bilateral;   Patient Active Problem List   Diagnosis Date Noted   Anemia 12/31/2021   Malignant neoplasm of upper-inner quadrant of left breast in female, estrogen receptor positive ([place]) 12/31/2021   Open angle with borderline findings and low glaucoma risk in both eyes 01/11/2016   Chronic idiopathic urticaria 01/11/2016   Age-related nuclear cataract of both eyes  01/11/2016   Eczema 01/11/2016    PCP: [name], MD  REFERRING PROVIDER: Gery Pray, MD  REFERRING DIAG: C50.212,Z17.0 (ICD-10-CM) - Malignant neoplasm of upper-inner quadrant of left breast in female, estrogen receptor positive ([place])  THERAPY DIAG:  Lymphedema, not elsewhere classified  Stiffness of left shoulder, not elsewhere classified  Disorder of the skin and subcutaneous tissue related to radiation, unspecified  Malignant neoplasm of upper-inner quadrant of left breast in female, estrogen receptor positive ([place])  ONSET DATE: 12/09/21  Rationale for Evaluation and Treatment Rehabilitation  SUBJECTIVE  SUBJECTIVE STATEMENT: I am having a little pain. I took some medication before I came.   PERTINENT HISTORY:  Patient was diagnosed on 12/09/21 with left grade 3. It measures 3 cm and is located in the upper inner quadrant. 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 04/13/22- underwent L breast lumpectomy and SLNB (0/3) 04/21/22- bilateral mastopexy  PAIN:  Are you having pain? Yes 3/10 currently in L lateral breast,  pt reports at night the pain is up to a 6 or 7/10 in the L axilla and UE  PRECAUTIONS: Other: at risk of lymphedema  WEIGHT BEARING RESTRICTIONS No  FALLS:  Has patient fallen in last 6 months? 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  LEISURE: pt has been doing PT exercises 2x/wk and on the other days pt walks  HAND DOMINANCE : right   PRIOR LEVEL OF FUNCTION: Independent  PATIENT GOALS to avoid lymphedema, decrease pain   OBJECTIVE  COGNITION:  Overall  cognitive status: Within functional limits for tasks assessed   PALPATION: 1 cord palpable in left lateral breast, fibrosis in L axilla  OBSERVATIONS / OTHER ASSESSMENTS: fullness in L breast, fullness and fibrosis in L axilla, 1 cord extending from scar in L lateral breast extending to axilla  POSTURE: forward head, rounded shoulders  UPPER EXTREMITY AROM/PROM:  A/PROM RIGHT   eval   Shoulder extension 62  Shoulder flexion 155  Shoulder abduction 177  Shoulder internal rotation 70  Shoulder external rotation 88    (Blank rows = not tested)  A/PROM LEFT   eval LEFT 08/19/22  Shoulder extension 57 65  Shoulder flexion 160 with p! 175  Shoulder abduction 125 172  Shoulder internal rotation 60 60  Shoulder external rotation 90 85    (Blank rows = not tested)    LYMPHEDEMA ASSESSMENTS:   SURGERY TYPE/DATE: 04/13/22 L lumpectomy and SLNB  NUMBER OF LYMPH NODES REMOVED: 0/3  CHEMOTHERAPY: Completed  RADIATION:completed  HORMONE TREATMENT: tamoxifen - currently taking  INFECTIONS: none  LYMPHEDEMA ASSESSMENTS:   LANDMARK RIGHT  eval  10 cm proximal to olecranon process 38  Olecranon process 31.5  10 cm proximal to ulnar styloid process 24  Just proximal to ulnar styloid process 18.5  Across hand at thumb web space 20.2  At base of 2nd digit 7  (Blank rows = not tested)  LANDMARK LEFT  eval  10 cm proximal to olecranon process 38  Olecranon process 31.5  10 cm proximal to ulnar styloid process 25  Just proximal to ulnar styloid process 19  Across hand at thumb web space 20  At base of 2nd digit 6.8  (Blank rows = not tested)   QUICK DASH SURVEY:       TODAY'S TREATMENT  08/19/22: Manual Therapy Ball up the wall x 10 reps in to flexion and 10 reps in to abduction on L only MLD: In Supine: Short neck, 5 diaphragmatic breaths, Lt inguinal and Rt axillary nodes, Lt axillo-inguinal and anterior int-er axillary anastomosis, then focused on Lt inferior  breast where some fibrosis palpable along with scar tissue MFR to cording extending from lumpectomy scar through L lateral breast and in to axilla PROM to L shoulder with pt able to obtain full PROM STM: Scar tissue mobs along incision inferior to Lt breast  08/17/22: Manual Therapy MLD: In Supine: Short neck, 5 diaphragmatic breaths, Lt inguinal and Rt axillary nodes, Lt axillo-inguinal and anterior int-er axillary anastomosis, then focused on Lt inferior breast where some fibrosis palpable along with scar tissue MFR to cording extending from lumpectomy scar through L lateral breast and in to axilla PROM to L shoulder with pt able to achieve full PROM by end of session and cord not visible until end ROM STM: Scar tissue mobs along incision inferior to Lt breast  08/12/22: MFR to cording extending from lumpectomy scar through L lateral breast and in to axilla, PROM to L shoulder with pt able to achieve full PROM by end of session and cord not visible until end ROM  PATIENT EDUCATION:  Education details: cording and importance of stretching to reduce cording, L breast lymphedema vs. Post op radiation edema, vs edema from cording Person educated: Patient Education method: Explanation Education comprehension: verbalized understanding   HOME EXERCISE PROGRAM: Stretch at end range and massage cording  ASSESSMENT:  CLINICAL IMPRESSION: Remeasured pt's shoulder ROM today and it has improved greatly since eval. She has returned to baseline. She is still having tightness at end range and demonstrates cording present from her axilla in to her lateral breast so continued with myofascial release to this area. Her L breast is still swollen so also continued MLD to decrease edema.    OBJECTIVE IMPAIRMENTS decreased ROM, increased edema, increased fascial restrictions, impaired UE functional use, postural dysfunction, and pain.   ACTIVITY LIMITATIONS carrying, lifting, and reach over  head  PARTICIPATION LIMITATIONS:  none  PERSONAL FACTORS  none  are also affecting patient's functional outcome.   REHAB POTENTIAL: Good  CLINICAL DECISION MAKING: Stable/uncomplicated  EVALUATION COMPLEXITY: Low  GOALS: Goals reviewed with patient? Yes  SHORT TERM GOALS=LONG TERM GOALS  Target date: 09/16/2022    Pt will be independent in self MLD for long term management of L breast edema.  Baseline: Goal status: INITIAL  2.  Pt will report a 75% improvement in pain in L breast, axilla and upper arm to allow improved comfort.  Baseline:  Goal status: INITIAL  3.  Pt will demonstrate 175 degrees of L shoulder abduction to allow her to reach out to the side.  Baseline: 125 Goal status: INITIAL  4.  Pt will be independent in a home exercise program for long term stretching and strengthening.  Baseline:  Goal status: INITIAL    PLAN: PT FREQUENCY: 2x/week  PT DURATION: 4 weeks  PLANNED INTERVENTIONS: Therapeutic exercises, Therapeutic activity, Patient/Family education, Self Care, Joint mobilization, Manual lymph drainage, Compression bandaging, scar mobilization, Taping, Vasopneumatic device, and Manual therapy  PLAN FOR NEXT SESSION: Cont MLD to L breast, MFR to cording in L lateral breast and axilla, MLD to L axilla, PROM to L shoulder, pulleys, ball   [employer], PT 08/19/2022, 10:55 AM

## 2022-08-24 ENCOUNTER — Ambulatory Visit: Payer: 59 | Attending: Radiation Oncology

## 2022-08-24 DIAGNOSIS — L599 Disorder of the skin and subcutaneous tissue related to radiation, unspecified: Secondary | ICD-10-CM | POA: Insufficient documentation

## 2022-08-24 DIAGNOSIS — M25612 Stiffness of left shoulder, not elsewhere classified: Secondary | ICD-10-CM | POA: Diagnosis present

## 2022-08-24 DIAGNOSIS — C50212 Malignant neoplasm of upper-inner quadrant of left female breast: Secondary | ICD-10-CM | POA: Diagnosis present

## 2022-08-24 DIAGNOSIS — Z170 Estrogen receptor positive status [ER+]: Secondary | ICD-10-CM | POA: Diagnosis present

## 2022-08-24 DIAGNOSIS — I890 Lymphedema, not elsewhere classified: Secondary | ICD-10-CM | POA: Diagnosis present

## 2022-08-24 NOTE — Therapy (Signed)
OUTPATIENT PHYSICAL THERAPY ONCOLOGY TREATMENT  Patient Name: GRIER CZERWINSKI MRN: [id number] DOB:November 30, 1970, 51 y.o., female Today's Date: 08/24/2022   PT End of Session - 08/24/22 1110     Visit Number 4    Number of Visits 9    Date for PT Re-Evaluation 09/09/22    PT Start Time 1108    PT Stop Time 1201    PT Time Calculation (min) 53 min    Activity Tolerance Patient tolerated treatment well    Behavior During Therapy [facility] for tasks assessed/performed             Past Medical History:  Diagnosis Date   Anemia    Anxiety    Depression    Glaucoma    History of radiation therapy    Left breast 05/28/22-07/06/22-Dr. Gery Pray   Hives    chronic, on Xolair injections   Past Surgical History:  Procedure Laterality Date   BREAST BIOPSY Left 01/19/2022   BREAST CYST EXCISION Left 01/28/2021   Procedure: LEFT BREAST MASS EXCISION;  Surgeon: Rolm Bookbinder, MD;  Location: [place];  Service: General;  Laterality: Left;  START TIME OF 3:00 PM FOR 60 MINUTES IN ROOM 8   BREAST LUMPECTOMY WITH RADIOACTIVE SEED AND SENTINEL LYMPH NODE BIOPSY Left 04/13/2022   Procedure: LEFT BREAST BRACKETED LUMPECTOMY WITH RADIOACTIVE SEED AND AXILLARY SENTINEL LYMPH NODE BIOPSY;  Surgeon: Rolm Bookbinder, MD;  Location: [place];  Service: General;  Laterality: Left;   HYSTEROSCOPY W/ ENDOMETRIAL ABLATION  08/14/2020   [facility]   MASTOPEXY Bilateral 04/21/2022   Procedure: MASTOPEXY;  Surgeon: Irene Limbo, MD;  Location: [place];  Service: Plastics;  Laterality: Bilateral;   Patient Active Problem List   Diagnosis Date Noted   Anemia 12/31/2021   Malignant neoplasm of upper-inner quadrant of left breast in female, estrogen receptor positive ([place]) 12/31/2021   Open angle with borderline findings and low glaucoma risk in both eyes 01/11/2016   Chronic idiopathic urticaria 01/11/2016   Age-related nuclear cataract of both eyes  01/11/2016   Eczema 01/11/2016    PCP: [name], MD  REFERRING PROVIDER: Gery Pray, MD  REFERRING DIAG: C50.212,Z17.0 (ICD-10-CM) - Malignant neoplasm of upper-inner quadrant of left breast in female, estrogen receptor positive ([place])  THERAPY DIAG:  Lymphedema, not elsewhere classified  Stiffness of left shoulder, not elsewhere classified  Disorder of the skin and subcutaneous tissue related to radiation, unspecified  Malignant neoplasm of upper-inner quadrant of left breast in female, estrogen receptor positive ([place])  ONSET DATE: 12/09/21  Rationale for Evaluation and Treatment Rehabilitation  SUBJECTIVE  SUBJECTIVE STATEMENT: I'm just a little tired this morning. I got up this morning and walked for about 45 mins. Not sure if I'm noticing any improvement in my breast yet, it just feels like I've been cut all the way across my chest.   PERTINENT HISTORY:  Patient was diagnosed on 12/09/21 with left grade 3. It measures 3 cm and is located in the upper inner quadrant. 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 04/13/22- underwent L breast lumpectomy and SLNB (0/3) 04/21/22- bilateral mastopexy  PAIN:  Are you having pain? No, not currently  PRECAUTIONS: Other: at risk of lymphedema  WEIGHT BEARING RESTRICTIONS No  FALLS:  Has patient fallen in last 6 months? 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  LEISURE: pt has been doing PT exercises 2x/wk and on the other days pt walks  HAND DOMINANCE : right   PRIOR LEVEL OF FUNCTION: Independent  PATIENT GOALS to avoid lymphedema,  decrease pain   OBJECTIVE  COGNITION:  Overall cognitive status: Within functional limits for tasks assessed   PALPATION: 1 cord palpable in left lateral breast, fibrosis in L axilla  OBSERVATIONS / OTHER ASSESSMENTS: fullness in L breast, fullness and fibrosis in L axilla, 1 cord extending from scar in L lateral breast extending to axilla  POSTURE: forward head, rounded shoulders  UPPER EXTREMITY AROM/PROM:  A/PROM RIGHT   eval   Shoulder extension 62  Shoulder flexion 155  Shoulder abduction 177  Shoulder internal rotation 70  Shoulder external rotation 88    (Blank rows = not tested)  A/PROM LEFT   eval LEFT 08/19/22  Shoulder extension 57 65  Shoulder flexion 160 with p! 175  Shoulder abduction 125 172  Shoulder internal rotation 60 60  Shoulder external rotation 90 85    (Blank rows = not tested)    LYMPHEDEMA ASSESSMENTS:   SURGERY TYPE/DATE: 04/13/22 L lumpectomy and SLNB  NUMBER OF LYMPH NODES REMOVED: 0/3  CHEMOTHERAPY: Completed  RADIATION:completed  HORMONE TREATMENT: tamoxifen - currently taking  INFECTIONS: none  LYMPHEDEMA ASSESSMENTS:   LANDMARK RIGHT  eval  10 cm proximal to olecranon process 38  Olecranon process 31.5  10 cm proximal to ulnar styloid process 24  Just proximal to ulnar styloid process 18.5  Across hand at thumb web space 20.2  At base of 2nd digit 7  (Blank rows = not tested)  LANDMARK LEFT  eval  10 cm proximal to olecranon process 38  Olecranon process 31.5  10 cm proximal to ulnar styloid process 25  Just proximal to ulnar styloid process 19  Across hand at thumb web space 20  At base of 2nd digit 6.8  (Blank rows = not tested)   QUICK DASH SURVEY:       TODAY'S TREATMENT  08/24/22: Therapeutic Exercises Pulleys into flexion and abduction x2 mins each with VCs to remind her to do with slower pace Roll yellow ball up wall into flexion x10, then Lt abduction x10  Manual Therapy MLD: In Supine: Short  neck, 5 diaphragmatic breaths, Lt inguinal and Rt axillary nodes, Lt axillo-inguinal and anterior inter-axillary anastomosis, then focused on Lt inferior breast where some fibrosis palpable along with scar tissue MFR to cording extending from lumpectomy scar through L lateral breast and in to axilla PROM to L  shoulder into flexion, abduction and D2 STM: Scar tissue mobs along incision inferior to Lt breast  08/19/22: Manual Therapy Ball up the wall x 10 reps in to flexion and 10 reps in to abduction on L only MLD: In Supine: Short neck, 5 diaphragmatic breaths, Lt inguinal and Rt axillary nodes, Lt axillo-inguinal and anterior int-er axillary anastomosis, then focused on Lt inferior breast where some fibrosis palpable along with scar tissue MFR to cording extending from lumpectomy scar through L lateral breast and in to axilla PROM to L shoulder with pt able to obtain full PROM STM: Scar tissue mobs along incision inferior to Lt breast  08/17/22: Manual Therapy MLD: In Supine: Short neck, 5 diaphragmatic breaths, Lt inguinal and Rt axillary nodes, Lt axillo-inguinal and anterior int-er axillary anastomosis, then focused on Lt inferior breast where some fibrosis palpable along with scar tissue MFR to cording extending from lumpectomy scar through L lateral breast and in to axilla PROM to L shoulder with pt able to achieve full PROM by end of session and cord not visible until end ROM STM: Scar tissue mobs along incision inferior to Lt breast   PATIENT EDUCATION:  Education details: cording and importance of stretching to reduce cording, L breast lymphedema vs. Post op radiation edema, vs edema from cording Person educated: Patient Education method: Explanation Education comprehension: verbalized understanding   HOME EXERCISE PROGRAM: Stretch at end range and massage cording  ASSESSMENT:  CLINICAL IMPRESSION: Continued with AA/ROM including pulleys today as well. Then continued with  manual therapy focusing on decreasing Lt upper quadrant tightness and breast MLD.    OBJECTIVE IMPAIRMENTS decreased ROM, increased edema, increased fascial restrictions, impaired UE functional use, postural dysfunction, and pain.   ACTIVITY LIMITATIONS carrying, lifting, and reach over head  PARTICIPATION LIMITATIONS:  none  PERSONAL FACTORS  none  are also affecting patient's functional outcome.   REHAB POTENTIAL: Good  CLINICAL DECISION MAKING: Stable/uncomplicated  EVALUATION COMPLEXITY: Low  GOALS: Goals reviewed with patient? Yes  SHORT TERM GOALS=LONG TERM GOALS  Target date: 09/21/2022    Pt will be independent in self MLD for long term management of L breast edema.  Baseline: Goal status: INITIAL  2.  Pt will report a 75% improvement in pain in L breast, axilla and upper arm to allow improved comfort.  Baseline:  Goal status: INITIAL  3.  Pt will demonstrate 175 degrees of L shoulder abduction to allow her to reach out to the side.  Baseline: 125 Goal status: INITIAL  4.  Pt will be independent in a home exercise program for long term stretching and strengthening.  Baseline:  Goal status: INITIAL    PLAN: PT FREQUENCY: 2x/week  PT DURATION: 4 weeks  PLANNED INTERVENTIONS: Therapeutic exercises, Therapeutic activity, Patient/Family education, Self Care, Joint mobilization, Manual lymph drainage, Compression bandaging, scar mobilization, Taping, Vasopneumatic device, and Manual therapy  PLAN FOR NEXT SESSION: Cont MLD to L breast, MFR to cording in L lateral breast and axilla, MLD to L axilla, PROM to L shoulder, pulleys, ball   Otelia Limes, PTA 08/24/2022, 1:15 PM

## 2022-08-26 ENCOUNTER — Ambulatory Visit: Payer: 59

## 2022-08-26 DIAGNOSIS — I890 Lymphedema, not elsewhere classified: Secondary | ICD-10-CM | POA: Diagnosis not present

## 2022-08-26 DIAGNOSIS — Z170 Estrogen receptor positive status [ER+]: Secondary | ICD-10-CM

## 2022-08-26 DIAGNOSIS — M25612 Stiffness of left shoulder, not elsewhere classified: Secondary | ICD-10-CM

## 2022-08-26 DIAGNOSIS — L599 Disorder of the skin and subcutaneous tissue related to radiation, unspecified: Secondary | ICD-10-CM

## 2022-08-26 NOTE — Therapy (Signed)
OUTPATIENT PHYSICAL THERAPY ONCOLOGY TREATMENT  Patient Name: SHAQUASHA GERSTEL MRN: [id number] DOB:February 13, 1971, 51 y.o., female Today's Date: 08/26/2022   PT End of Session - 08/26/22 1007     Visit Number 5    Number of Visits 9    Date for PT Re-Evaluation 09/09/22    PT Start Time 1005    PT Stop Time 1100    PT Time Calculation (min) 55 min    Activity Tolerance Patient tolerated treatment well    Behavior During Therapy [facility] for tasks assessed/performed             Past Medical History:  Diagnosis Date   Anemia    Anxiety    Depression    Glaucoma    History of radiation therapy    Left breast 05/28/22-07/06/22-Dr. Gery Pray   Hives    chronic, on Xolair injections   Past Surgical History:  Procedure Laterality Date   BREAST BIOPSY Left 01/19/2022   BREAST CYST EXCISION Left 01/28/2021   Procedure: LEFT BREAST MASS EXCISION;  Surgeon: Rolm Bookbinder, MD;  Location: [place];  Service: General;  Laterality: Left;  START TIME OF 3:00 PM FOR 60 MINUTES IN ROOM 8   BREAST LUMPECTOMY WITH RADIOACTIVE SEED AND SENTINEL LYMPH NODE BIOPSY Left 04/13/2022   Procedure: LEFT BREAST BRACKETED LUMPECTOMY WITH RADIOACTIVE SEED AND AXILLARY SENTINEL LYMPH NODE BIOPSY;  Surgeon: Rolm Bookbinder, MD;  Location: [place];  Service: General;  Laterality: Left;   HYSTEROSCOPY W/ ENDOMETRIAL ABLATION  08/14/2020   [facility]   MASTOPEXY Bilateral 04/21/2022   Procedure: MASTOPEXY;  Surgeon: Irene Limbo, MD;  Location: [place];  Service: Plastics;  Laterality: Bilateral;   Patient Active Problem List   Diagnosis Date Noted   Anemia 12/31/2021   Malignant neoplasm of upper-inner quadrant of left breast in female, estrogen receptor positive ([place]) 12/31/2021   Open angle with borderline findings and low glaucoma risk in both eyes 01/11/2016   Chronic idiopathic urticaria 01/11/2016   Age-related nuclear cataract of both eyes  01/11/2016   Eczema 01/11/2016    PCP: [name], MD  REFERRING PROVIDER: Gery Pray, MD  REFERRING DIAG: C50.212,Z17.0 (ICD-10-CM) - Malignant neoplasm of upper-inner quadrant of left breast in female, estrogen receptor positive ([place])  THERAPY DIAG:  Lymphedema, not elsewhere classified  Stiffness of left shoulder, not elsewhere classified  Disorder of the skin and subcutaneous tissue related to radiation, unspecified  Malignant neoplasm of upper-inner quadrant of left breast in female, estrogen receptor positive ([place])  ONSET DATE: 12/09/21  Rationale for Evaluation and Treatment Rehabilitation  SUBJECTIVE  SUBJECTIVE STATEMENT: I keep feeling that pull across my chest, it feels like I was cut across my chest.   PERTINENT HISTORY:  Patient was diagnosed on 12/09/21 with left grade 3. It measures 3 cm and is located in the upper inner quadrant. 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 04/13/22- underwent L breast lumpectomy and SLNB (0/3) 04/21/22- bilateral mastopexy  PAIN:  Are you having pain? No, not currently  PRECAUTIONS: Other: at risk of lymphedema  WEIGHT BEARING RESTRICTIONS No  FALLS:  Has patient fallen in last 6 months? 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  LEISURE: pt has been doing PT exercises 2x/wk and on the other days pt walks  HAND DOMINANCE : right   PRIOR LEVEL OF FUNCTION: Independent  PATIENT GOALS to avoid lymphedema, decrease pain   OBJECTIVE  COGNITION:  Overall cognitive status: Within functional limits for tasks assessed   PALPATION: 1  cord palpable in left lateral breast, fibrosis in L axilla  OBSERVATIONS / OTHER ASSESSMENTS: fullness in L breast, fullness and fibrosis in L axilla, 1 cord extending from scar in L lateral breast extending to axilla  POSTURE: forward head, rounded shoulders  UPPER EXTREMITY AROM/PROM:  A/PROM RIGHT   eval   Shoulder extension 62  Shoulder flexion 155  Shoulder abduction 177  Shoulder internal rotation 70  Shoulder external rotation 88    (Blank rows = not tested)  A/PROM LEFT   eval LEFT 08/19/22 08/26/22  Shoulder extension 57 65   Shoulder flexion 160 with p! 175 160 with pull/tingle  Shoulder abduction 125 172 173  Shoulder internal rotation 60 60   Shoulder external rotation 90 85     (Blank rows = not tested)    LYMPHEDEMA ASSESSMENTS:   SURGERY TYPE/DATE: 04/13/22 L lumpectomy and SLNB  NUMBER OF LYMPH NODES REMOVED: 0/3  CHEMOTHERAPY: Completed  RADIATION:completed  HORMONE TREATMENT: tamoxifen - currently taking  INFECTIONS: none  LYMPHEDEMA ASSESSMENTS:   LANDMARK RIGHT  eval  10 cm proximal to olecranon process 38  Olecranon process 31.5  10 cm proximal to ulnar styloid process 24  Just proximal to ulnar styloid process 18.5  Across hand at thumb web space 20.2  At base of 2nd digit 7  (Blank rows = not tested)  LANDMARK LEFT  eval  10 cm proximal to olecranon process 38  Olecranon process 31.5  10 cm proximal to ulnar styloid process 25  Just proximal to ulnar styloid process 19  Across hand at thumb web space 20  At base of 2nd digit 6.8  (Blank rows = not tested)   QUICK DASH SURVEY:       TODAY'S TREATMENT  08/26/22: Therapeutic Exercises Doorway Pectoralis Stretch in varying UE positions for different stretch 4-5x total x20 sec each Pulleys into flexion and abduction x2 mins each with VCs to decrease stretch Roll yellow ball up wall into flexion x10, then Lt abduction x10 each Supine low trunk rotation with bil UE's in horz  abd for trunk stretch at incisions Manual Therapy MLD: In Supine: Short neck, 5 diaphragmatic breaths, Lt inguinal and Rt axillary nodes, Lt axillo-inguinal and anterior inter-axillary anastomosis, then focused on Lt inferior breast where some fibrosis palpable along with scar tissue MFR to cording extending from lumpectomy scar through L lateral  breast and in to axilla; also with LTR a few times for MFR to anterior trunk PROM to L shoulder into flexion, abduction and D2 STM: Scar tissue mobs along incision inferior to Lt breast  08/24/22: Therapeutic Exercises Pulleys into flexion and abduction x2 mins each with VCs to remind her to do with slower pace Roll yellow ball up wall into flexion x10, then Lt abduction x10  Modified downward dog on wall 5x, 5 sec holds returning therapist demo Manual Therapy MLD: In Supine: Short neck, 5 diaphragmatic breaths, Lt inguinal and Rt axillary nodes, Lt axillo-inguinal and anterior inter-axillary anastomosis, then focused on Lt inferior breast where some fibrosis palpable along with scar tissue MFR to cording extending from lumpectomy scar through L lateral breast and in to axilla PROM to L shoulder into flexion, abduction and D2 STM: Scar tissue mobs along incision inferior to Lt breast  08/19/22: Manual Therapy Ball up the wall x 10 reps in to flexion and 10 reps in to abduction on L only MLD: In Supine: Short neck, 5 diaphragmatic breaths, Lt inguinal and Rt axillary nodes, Lt axillo-inguinal and anterior int-er axillary anastomosis, then focused on Lt inferior breast where some fibrosis palpable along with scar tissue MFR to cording extending from lumpectomy scar through L lateral breast and in to axilla PROM to L shoulder with pt able to obtain full PROM STM: Scar tissue mobs along incision inferior to Lt breast   PATIENT EDUCATION:  Education details: cording and importance of stretching to reduce cording, L breast lymphedema vs. Post op  radiation edema, vs edema from cording Person educated: Patient Education method: Explanation Education comprehension: verbalized understanding   HOME EXERCISE PROGRAM: Stretch at end range and massage cording  ASSESSMENT:  CLINICAL IMPRESSION: Continued with AA/ROM including downward dog on wall which pt reported feeling good stretch. Remeasured pts A/ROM. Her abduction is the same but she reports feeling less tight, her flexion has decreased but pt reports this feeling tight today in this position. She does reports ~10% improvement at  this time in reduction of Lt upper quadrant pain/discomfort. Pt is progressing well towards goals.    OBJECTIVE IMPAIRMENTS decreased ROM, increased edema, increased fascial restrictions, impaired UE functional use, postural dysfunction, and pain.   ACTIVITY LIMITATIONS carrying, lifting, and reach over head  PARTICIPATION LIMITATIONS:  none  PERSONAL FACTORS  none  are also affecting patient's functional outcome.   REHAB POTENTIAL: Good  CLINICAL DECISION MAKING: Stable/uncomplicated  EVALUATION COMPLEXITY: Low  GOALS: Goals reviewed with patient? Yes  SHORT TERM GOALS=LONG TERM GOALS  Target date: 09/23/2022    Pt will be independent in self MLD for long term management of L breast edema.  Baseline: Goal status: ONGOING  2.  Pt will report a 75% improvement in pain in L breast, axilla and upper arm to allow improved comfort.  Baseline:  Goal status: INITIAL  3.  Pt will demonstrate 175 degrees of L shoulder abduction to allow her to reach out to the side.  Baseline: 125 Goal status: INITIAL  4.  Pt will be independent in a home exercise program for long term stretching and strengthening.  Baseline:  Goal status: INITIAL    PLAN: PT FREQUENCY: 2x/week  PT DURATION: 4 weeks  PLANNED INTERVENTIONS: Therapeutic exercises, Therapeutic activity, Patient/Family education, Self Care, Joint mobilization, Manual lymph drainage,  Compression bandaging, scar mobilization, Taping, Vasopneumatic device, and Manual therapy  PLAN FOR NEXT SESSION: Cont MLD to L breast, MFR to cording in L lateral  breast and axilla, MLD to L axilla, PROM to L shoulder, pulleys, ball   Otelia Limes, PTA 08/26/2022, 11:33 AM

## 2022-08-31 ENCOUNTER — Ambulatory Visit (INDEPENDENT_AMBULATORY_CARE_PROVIDER_SITE_OTHER): Payer: 59 | Admitting: Psychiatry

## 2022-08-31 ENCOUNTER — Ambulatory Visit: Payer: 59

## 2022-08-31 DIAGNOSIS — I890 Lymphedema, not elsewhere classified: Secondary | ICD-10-CM

## 2022-08-31 DIAGNOSIS — M25612 Stiffness of left shoulder, not elsewhere classified: Secondary | ICD-10-CM

## 2022-08-31 DIAGNOSIS — F4310 Post-traumatic stress disorder, unspecified: Secondary | ICD-10-CM

## 2022-08-31 DIAGNOSIS — L599 Disorder of the skin and subcutaneous tissue related to radiation, unspecified: Secondary | ICD-10-CM

## 2022-08-31 DIAGNOSIS — C50212 Malignant neoplasm of upper-inner quadrant of left female breast: Secondary | ICD-10-CM

## 2022-08-31 DIAGNOSIS — F3341 Major depressive disorder, recurrent, in partial remission: Secondary | ICD-10-CM
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
## 2022-08-31 NOTE — Therapy (Signed)
OUTPATIENT PHYSICAL THERAPY ONCOLOGY TREATMENT  Patient Name: Maria Diaz MRN: [id number] DOB:[birth year], 51 y.o., female Today's Date: 08/31/2022   PT End of Session - 08/31/22 1215     Visit Number 6    Number of Visits 9    Date for PT Re-Evaluation 09/09/22    PT Start Time 1207    PT Stop Time 1302    PT Time Calculation (min) 55 min    Activity Tolerance Patient tolerated treatment well    Behavior During Therapy [facility] for tasks assessed/performed             Past Medical History:  Diagnosis Date   Anemia    Anxiety    Depression    Glaucoma    History of radiation therapy    Left breast 05/28/22-07/06/22-Dr. Gery Pray   Hives    chronic, on Xolair injections   Past Surgical History:  Procedure Laterality Date   BREAST BIOPSY Left 01/19/2022   BREAST CYST EXCISION Left 01/28/2021   Procedure: LEFT BREAST MASS EXCISION;  Surgeon: Rolm Bookbinder, MD;  Location: [place];  Service: General;  Laterality: Left;  START TIME OF 3:00 PM FOR 60 MINUTES IN ROOM 8   BREAST LUMPECTOMY WITH RADIOACTIVE SEED AND SENTINEL LYMPH NODE BIOPSY Left 04/13/2022   Procedure: LEFT BREAST BRACKETED LUMPECTOMY WITH RADIOACTIVE SEED AND AXILLARY SENTINEL LYMPH NODE BIOPSY;  Surgeon: Rolm Bookbinder, MD;  Location: [place];  Service: General;  Laterality: Left;   HYSTEROSCOPY W/ ENDOMETRIAL ABLATION  08/14/2020   [facility]   MASTOPEXY Bilateral 04/21/2022   Procedure: MASTOPEXY;  Surgeon: Irene Limbo, MD;  Location: [place];  Service: Plastics;  Laterality: Bilateral;   Patient Active Problem List   Diagnosis Date Noted   Anemia 12/31/2021   Malignant neoplasm of upper-inner quadrant of left breast in female, estrogen receptor positive ([place]) 12/31/2021   Open angle with borderline findings and low glaucoma risk in both eyes 01/11/2016   Chronic idiopathic urticaria 01/11/2016   Age-related nuclear cataract of both eyes  01/11/2016   Eczema 01/11/2016    PCP: [name], MD  REFERRING PROVIDER: Gery Pray, MD  REFERRING DIAG: C50.212,Z17.0 (ICD-10-CM) - Malignant neoplasm of upper-inner quadrant of left breast in female, estrogen receptor positive ([place])  THERAPY DIAG:  Lymphedema, not elsewhere classified  Stiffness of left shoulder, not elsewhere classified  Disorder of the skin and subcutaneous tissue related to radiation, unspecified  Malignant neoplasm of upper-inner quadrant of left breast in female, estrogen receptor positive ([place])  ONSET DATE: 12/09/21  Rationale for Evaluation and Treatment Rehabilitation  SUBJECTIVE  SUBJECTIVE STATEMENT: I've been doing the doorway stretch and it has helped to loosen up the tightness across my chest.    PERTINENT HISTORY:  Patient was diagnosed on 12/09/21 with left grade 3. It measures 3 cm and is located in the upper inner quadrant. 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 04/13/22- underwent L breast lumpectomy and SLNB (0/3) 04/21/22- bilateral mastopexy  PAIN:  Are you having pain? No, not currently  PRECAUTIONS: Other: at risk of lymphedema  WEIGHT BEARING RESTRICTIONS No  FALLS:  Has patient fallen in last 6 months? 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  LEISURE: pt has been doing PT exercises 2x/wk and on the other days pt walks  HAND DOMINANCE : right   PRIOR LEVEL OF FUNCTION: Independent  PATIENT GOALS to avoid lymphedema, decrease pain   OBJECTIVE  COGNITION:  Overall cognitive status: Within functional limits for tasks  assessed   PALPATION: 1 cord palpable in left lateral breast, fibrosis in L axilla  OBSERVATIONS / OTHER ASSESSMENTS: fullness in L breast, fullness and fibrosis in L axilla, 1 cord extending from scar in L lateral breast extending to axilla  POSTURE: forward head, rounded shoulders  UPPER EXTREMITY AROM/PROM:  A/PROM RIGHT   eval   Shoulder extension 62  Shoulder flexion 155  Shoulder abduction 177  Shoulder internal rotation 70  Shoulder external rotation 88    (Blank rows = not tested)  A/PROM LEFT   eval LEFT 08/19/22 08/26/22  Shoulder extension 57 65   Shoulder flexion 160 with p! 175 160 with pull/tingle  Shoulder abduction 125 172 173  Shoulder internal rotation 60 60   Shoulder external rotation 90 85     (Blank rows = not tested)    LYMPHEDEMA ASSESSMENTS:   SURGERY TYPE/DATE: 04/13/22 L lumpectomy and SLNB  NUMBER OF LYMPH NODES REMOVED: 0/3  CHEMOTHERAPY: Completed  RADIATION:completed  HORMONE TREATMENT: tamoxifen - currently taking  INFECTIONS: none  LYMPHEDEMA ASSESSMENTS:   LANDMARK RIGHT  eval  10 cm proximal to olecranon process 38  Olecranon process 31.5  10 cm proximal to ulnar styloid process 24  Just proximal to ulnar styloid process 18.5  Across hand at thumb web space 20.2  At base of 2nd digit 7  (Blank rows = not tested)  LANDMARK LEFT  eval  10 cm proximal to olecranon process 38  Olecranon process 31.5  10 cm proximal to ulnar styloid process 25  Just proximal to ulnar styloid process 19  Across hand at thumb web space 20  At base of 2nd digit 6.8  (Blank rows = not tested)   QUICK DASH SURVEY:       TODAY'S TREATMENT  08/31/22: Therapeutic Exercises Pulleys into flexion and abduction x2 mins each  Roll yellow ball up wall into flexion x10, then Lt abduction x10 each Modified Downward Dog on wall 5x, 5 sec holds Rt S/L over pink bolster with Lt UE OH reach for Lt intercostal/lateral trunk stretch 5x, 5 sec holds  returning therapist demo, pt also felt stretch in area of cording Manual Therapy MLD: In Supine: Short neck, 5 diaphragmatic breaths, Lt inguinal and Rt axillary nodes, Lt axillo-inguinal and anterior inter-axillary anastomosis, then focused on Lt inferior breast where some fibrosis palpable along with scar tissue but this felt  much improved today and peau d'orange seemed less as well MFR to cording extending from lumpectomy scar at axilla PROM to L shoulder into flexion, abduction and D2 with full P/ROM STM: Scar tissue mobs along incision inferior to Lt breast with pt in Rt S/L  08/26/22: Therapeutic Exercises Doorway Pectoralis Stretch in varying UE positions for different stretch 4-5x total x20 sec each Pulleys into flexion and abduction x2 mins each with VCs to decrease stretch Roll yellow ball up wall into flexion x10, then Lt abduction x10 each Supine low trunk rotation with bil UE's in horz abd for trunk stretch at incisions Manual Therapy MLD: In Supine: Short neck, 5 diaphragmatic breaths, Lt inguinal and Rt axillary nodes, Lt axillo-inguinal and anterior inter-axillary anastomosis, then focused on Lt inferior breast where some fibrosis palpable along with scar tissue MFR to cording extending from lumpectomy scar through L lateral breast and in to axilla; also with LTR a few times for MFR to anterior trunk PROM to L shoulder into flexion, abduction and D2 STM: Scar tissue mobs along incision inferior to Lt breast  08/24/22: Therapeutic Exercises Pulleys into flexion and abduction x2 mins each with VCs to remind her to do with slower pace Roll yellow ball up wall into flexion x10, then Lt abduction x10  Modified downward dog on wall 5x, 5 sec holds returning therapist demo Manual Therapy MLD: In Supine: Short neck, 5 diaphragmatic breaths, Lt inguinal and Rt axillary nodes, Lt axillo-inguinal and anterior inter-axillary anastomosis, then focused on Lt inferior breast where some  fibrosis palpable along with scar tissue MFR to cording extending from lumpectomy scar through L lateral breast and in to axilla PROM to L shoulder into flexion, abduction and D2 STM: Scar tissue mobs along incision inferior to Lt breast   PATIENT EDUCATION:  Education details: cording and importance of stretching to reduce cording, L breast lymphedema vs. Post op radiation edema, vs edema from cording Person educated: Patient Education method: Explanation Education comprehension: verbalized understanding   HOME EXERCISE PROGRAM: Stretch at end range and massage cording  ASSESSMENT:  CLINICAL IMPRESSION: Continued with AA/ROM and added S/L stretch over bolster then continued with manual therapy working to decrease Lt upper quadrant tightness. Lt axilla is beginning to feel softer, cording less palpable and her end P/ROM improved. Good improvements noted today.  OBJECTIVE IMPAIRMENTS decreased ROM, increased edema, increased fascial restrictions, impaired UE functional use, postural dysfunction, and pain.   ACTIVITY LIMITATIONS carrying, lifting, and reach over head  PARTICIPATION LIMITATIONS:  none  PERSONAL FACTORS  none  are also affecting patient's functional outcome.   REHAB POTENTIAL: Good  CLINICAL DECISION MAKING: Stable/uncomplicated  EVALUATION COMPLEXITY: Low  GOALS: Goals reviewed with patient? Yes  SHORT TERM GOALS=LONG TERM GOALS  Target date: 09/28/2022    Pt will be independent in self MLD for long term management of L breast edema.  Baseline: Goal status: ONGOING  2.  Pt will report a 75% improvement in pain in L breast, axilla and upper arm to allow improved comfort.  Baseline:  Goal status: INITIAL  3.  Pt will demonstrate 175 degrees of L shoulder abduction to allow her to reach out to the side.  Baseline: 125 Goal status: INITIAL  4.  Pt will be independent in a home exercise program for long term stretching and strengthening.  Baseline:  Goal  status: INITIAL    PLAN: PT FREQUENCY: 2x/week  PT DURATION: 4 weeks  PLANNED INTERVENTIONS: Therapeutic exercises, Therapeutic activity, Patient/Family education, Self  Care, Joint mobilization, Manual lymph drainage, Compression bandaging, scar mobilization, Taping, Vasopneumatic device, and Manual therapy  PLAN FOR NEXT SESSION: Cont MLD to L breast, MFR to cording in L lateral breast and axilla, MLD to L axilla, PROM to L shoulder, pulleys, ball   Otelia Limes, PTA 08/31/2022, 1:04 PM

## 2022-09-02 ENCOUNTER — Ambulatory Visit: Payer: 59

## 2022-09-02 DIAGNOSIS — I890 Lymphedema, not elsewhere classified: Secondary | ICD-10-CM | POA: Diagnosis not present

## 2022-09-02 DIAGNOSIS — L599 Disorder of the skin and subcutaneous tissue related to radiation, unspecified: Secondary | ICD-10-CM

## 2022-09-02 DIAGNOSIS — M25612 Stiffness of left shoulder, not elsewhere classified: Secondary | ICD-10-CM

## 2022-09-02 DIAGNOSIS — Z170 Estrogen receptor positive status [ER+]: Secondary | ICD-10-CM

## 2022-09-02 NOTE — Therapy (Signed)
OUTPATIENT PHYSICAL THERAPY ONCOLOGY TREATMENT  Patient Name: Maria Diaz MRN: [id number] DOB:1971-11-23, 51 y.o., female Today's Date: 09/02/2022   PT End of Session - 09/02/22 1006     Visit Number 7    Number of Visits 9    Date for PT Re-Evaluation 09/09/22    PT Start Time 1005    PT Stop Time 1100    PT Time Calculation (min) 55 min    Activity Tolerance Patient tolerated treatment well    Behavior During Therapy [facility] for tasks assessed/performed             Past Medical History:  Diagnosis Date   Anemia    Anxiety    Depression    Glaucoma    History of radiation therapy    Left breast 05/28/22-07/06/22-Dr. Gery Pray   Hives    chronic, on Xolair injections   Past Surgical History:  Procedure Laterality Date   BREAST BIOPSY Left 01/19/2022   BREAST CYST EXCISION Left 01/28/2021   Procedure: LEFT BREAST MASS EXCISION;  Surgeon: Rolm Bookbinder, MD;  Location: [place];  Service: General;  Laterality: Left;  START TIME OF 3:00 PM FOR 60 MINUTES IN ROOM 8   BREAST LUMPECTOMY WITH RADIOACTIVE SEED AND SENTINEL LYMPH NODE BIOPSY Left 04/13/2022   Procedure: LEFT BREAST BRACKETED LUMPECTOMY WITH RADIOACTIVE SEED AND AXILLARY SENTINEL LYMPH NODE BIOPSY;  Surgeon: Rolm Bookbinder, MD;  Location: [place];  Service: General;  Laterality: Left;   HYSTEROSCOPY W/ ENDOMETRIAL ABLATION  08/14/2020   [facility]   MASTOPEXY Bilateral 04/21/2022   Procedure: MASTOPEXY;  Surgeon: Irene Limbo, MD;  Location: [place];  Service: Plastics;  Laterality: Bilateral;   Patient Active Problem List   Diagnosis Date Noted   Anemia 12/31/2021   Malignant neoplasm of upper-inner quadrant of left breast in female, estrogen receptor positive ([place]) 12/31/2021   Open angle with borderline findings and low glaucoma risk in both eyes 01/11/2016   Chronic idiopathic urticaria 01/11/2016   Age-related nuclear cataract of both eyes  01/11/2016   Eczema 01/11/2016    PCP: [name], MD  REFERRING PROVIDER: Gery Pray, MD  REFERRING DIAG: C50.212,Z17.0 (ICD-10-CM) - Malignant neoplasm of upper-inner quadrant of left breast in female, estrogen receptor positive ([place])  THERAPY DIAG:  Lymphedema, not elsewhere classified  Stiffness of left shoulder, not elsewhere classified  Disorder of the skin and subcutaneous tissue related to radiation, unspecified  Malignant neoplasm of upper-inner quadrant of left breast in female, estrogen receptor positive ([place])  ONSET DATE: 12/09/21  Rationale for Evaluation and Treatment Rehabilitation  SUBJECTIVE  SUBJECTIVE STATEMENT: Nothing new since I was here last.     PERTINENT HISTORY:  Patient was diagnosed on 12/09/21 with left grade 3. It measures 3 cm and is located in the upper inner quadrant. 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 04/13/22- underwent L breast lumpectomy and SLNB (0/3) 04/21/22- bilateral mastopexy  PAIN:  Are you having pain? No, not currently  PRECAUTIONS: Other: at risk of lymphedema  WEIGHT BEARING RESTRICTIONS No  FALLS:  Has patient fallen in last 6 months? 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  LEISURE: pt has been doing PT exercises 2x/wk and on the other days pt walks  HAND DOMINANCE : right   PRIOR LEVEL OF FUNCTION: Independent  PATIENT GOALS to avoid lymphedema, decrease pain   OBJECTIVE  COGNITION:  Overall cognitive status: Within functional limits for tasks assessed   PALPATION: 1 cord palpable in left lateral breast, fibrosis in  L axilla  OBSERVATIONS / OTHER ASSESSMENTS: fullness in L breast, fullness and fibrosis in L axilla, 1 cord extending from scar in L lateral breast extending to axilla  POSTURE: forward head, rounded shoulders  UPPER EXTREMITY AROM/PROM:  A/PROM RIGHT   eval   Shoulder extension 62  Shoulder flexion 155  Shoulder abduction 177  Shoulder internal rotation 70  Shoulder external rotation 88    (Blank rows = not tested)  A/PROM LEFT   eval LEFT 08/19/22 08/26/22 09/02/22  Shoulder extension 57 65    Shoulder flexion 160 with p! 175 160 with pull/tingle 165 with pull only  Shoulder abduction 125 172 173 174  Shoulder internal rotation 60 60    Shoulder external rotation 90 85      (Blank rows = not tested)    LYMPHEDEMA ASSESSMENTS:   SURGERY TYPE/DATE: 04/13/22 L lumpectomy and SLNB  NUMBER OF LYMPH NODES REMOVED: 0/3  CHEMOTHERAPY: Completed  RADIATION:completed  HORMONE TREATMENT: tamoxifen - currently taking  INFECTIONS: none  LYMPHEDEMA ASSESSMENTS:   LANDMARK RIGHT  eval  10 cm proximal to olecranon process 38  Olecranon process 31.5  10 cm proximal to ulnar styloid process 24  Just proximal to ulnar styloid process 18.5  Across hand at thumb web space 20.2  At base of 2nd digit 7  (Blank rows = not tested)  LANDMARK LEFT  eval  10 cm proximal to olecranon process 38  Olecranon process 31.5  10 cm proximal to ulnar styloid process 25  Just proximal to ulnar styloid process 19  Across hand at thumb web space 20  At base of 2nd digit 6.8  (Blank rows = not tested)   QUICK DASH SURVEY:       TODAY'S TREATMENT  09/02/22: Therapeutic Exercises Pulleys into flexion and abduction x2 mins each  Roll yellow ball up wall into flexion x10, then Lt abduction x10 each Modified Downward Dog on wall 5x, 5 sec holds Rt S/L over pink bolster with Lt UE OH reach for Lt intercostal/lateral trunk stretch 5x, 5 sec holds with pt reporting still feeling good  stretch with this Manual Therapy MLD: In Supine: Short neck, 5 diaphragmatic breaths, Lt inguinal and Rt axillary nodes, Lt axillo-inguinal and anterior inter-axillary anastomosis, then focused on Lt inferior breast, this area mch improved  MFR to cording extending from lumpectomy scar at axilla;  this was much softer today PROM to L shoulder into flexion, abduction and D2 with full P/ROM  08/31/22: Therapeutic Exercises Pulleys into flexion and abduction x2 mins each  Roll yellow ball up wall into flexion x10, then Lt abduction x10 each Modified Downward Dog on wall 5x, 5 sec holds Rt S/L over pink bolster with Lt UE OH reach for Lt intercostal/lateral trunk stretch 5x, 5 sec holds returning therapist demo, pt also felt stretch in area of cording Manual Therapy MLD: In Supine: Short neck, 5 diaphragmatic breaths, Lt inguinal and Rt axillary nodes, Lt axillo-inguinal and anterior inter-axillary anastomosis, then focused on Lt inferior breast where some fibrosis palpable along with scar tissue but this felt much improved today and peau d'orange seemed less as well MFR to cording extending from lumpectomy scar at axilla PROM to L shoulder into flexion, abduction and D2 with full P/ROM STM: Scar tissue mobs along incision inferior to Lt breast with pt in Rt S/L  08/26/22: Therapeutic Exercises Doorway Pectoralis Stretch in varying UE positions for different stretch 4-5x total x20 sec each Pulleys into flexion and abduction x2 mins each with VCs to decrease stretch Roll yellow ball up wall into flexion x10, then Lt abduction x10 each Supine low trunk rotation with bil UE's in horz abd for trunk stretch at incisions Manual Therapy MLD: In Supine: Short neck, 5 diaphragmatic breaths, Lt inguinal and Rt axillary nodes, Lt axillo-inguinal and anterior inter-axillary anastomosis, then focused on Lt inferior breast where some fibrosis palpable along with scar tissue MFR to cording extending from  lumpectomy scar through L lateral breast and in to axilla; also with LTR a few times for MFR to anterior trunk PROM to L shoulder into flexion, abduction and D2 STM: Scar tissue mobs along incision inferior to Lt breast   PATIENT EDUCATION:  Education details: cording and importance of stretching to reduce cording, L breast lymphedema vs. Post op radiation edema, vs edema from cording Person educated: Patient Education method: Explanation Education comprehension: verbalized understanding   HOME EXERCISE PROGRAM: Stretch at end range and massage cording  ASSESSMENT:  CLINICAL IMPRESSION: Continued with AA/ROM focusing on end range stretches into areas of tightness. Then continued with focus on manual therapy working to decrease cording and Lt breast lymphedema which pt reports has improved since start of care. Pt needs renewal next week and would like to decr freq to 1x/wk so she can be assessed while working towards D/C.   OBJECTIVE IMPAIRMENTS decreased ROM, increased edema, increased fascial restrictions, impaired UE functional use, postural dysfunction, and pain.   ACTIVITY LIMITATIONS carrying, lifting, and reach over head  PARTICIPATION LIMITATIONS:  none  PERSONAL FACTORS  none  are also affecting patient's functional outcome.   REHAB POTENTIAL: Good  CLINICAL DECISION MAKING: Stable/uncomplicated  EVALUATION COMPLEXITY: Low  GOALS: Goals reviewed with patient? Yes  SHORT TERM GOALS=LONG TERM GOALS  Target date: 09/30/2022    Pt will be independent in self MLD for long term management of L breast edema.  Baseline: Goal status: ONGOING  2.  Pt will report a 75% improvement in pain in L breast, axilla and upper arm to allow improved comfort.  Baseline:  Goal status: INITIAL  3.  Pt will demonstrate 175 degrees of L shoulder abduction to allow her to reach out to the side.  Baseline: 125 Goal status: INITIAL  4.  Pt will be independent in a home exercise program  for long term stretching and strengthening.  Baseline:  Goal status:  INITIAL    PLAN: PT FREQUENCY: 2x/week  PT DURATION: 4 weeks  PLANNED INTERVENTIONS: Therapeutic exercises, Therapeutic activity, Patient/Family education, Self Care, Joint mobilization, Manual lymph drainage, Compression bandaging, scar mobilization, Taping, Vasopneumatic device, and Manual therapy  PLAN FOR NEXT SESSION: Cont MLD to L breast, MFR to cording in L lateral breast and axilla, MLD to L axilla, PROM to L shoulder, pulleys, ball   Otelia Limes, PTA 09/02/2022, 11:02 AM

## 2022-09-07 ENCOUNTER — Ambulatory Visit: Payer: 59 | Admitting: Physical Therapy

## 2022-09-07 ENCOUNTER — Encounter: Payer: Self-pay | Admitting: Physical Therapy

## 2022-09-07 DIAGNOSIS — M25612 Stiffness of left shoulder, not elsewhere classified: Secondary | ICD-10-CM

## 2022-09-07 DIAGNOSIS — I890 Lymphedema, not elsewhere classified: Secondary | ICD-10-CM

## 2022-09-07 DIAGNOSIS — Z170 Estrogen receptor positive status [ER+]: Secondary | ICD-10-CM

## 2022-09-07 DIAGNOSIS — L599 Disorder of the skin and subcutaneous tissue related to radiation, unspecified: Secondary | ICD-10-CM

## 2022-09-07 NOTE — Therapy (Signed)
OUTPATIENT PHYSICAL THERAPY ONCOLOGY TREATMENT  Patient Name: Maria Diaz MRN: [id number] DOB:February 09, 1971, 51 y.o., female Today's Date: 09/07/2022   PT End of Session - 09/07/22 1014     Visit Number 8    Number of Visits 9    Date for PT Re-Evaluation 09/09/22    PT Start Time 1004    PT Stop Time 1053    PT Time Calculation (min) 49 min    Activity Tolerance Patient tolerated treatment well    Behavior During Therapy [facility] for tasks assessed/performed             Past Medical History:  Diagnosis Date   Anemia    Anxiety    Depression    Glaucoma    History of radiation therapy    Left breast 05/28/22-07/06/22-Dr. Gery Pray   Hives    chronic, on Xolair injections   Past Surgical History:  Procedure Laterality Date   BREAST BIOPSY Left 01/19/2022   BREAST CYST EXCISION Left 01/28/2021   Procedure: LEFT BREAST MASS EXCISION;  Surgeon: Rolm Bookbinder, MD;  Location: [place];  Service: General;  Laterality: Left;  START TIME OF 3:00 PM FOR 60 MINUTES IN ROOM 8   BREAST LUMPECTOMY WITH RADIOACTIVE SEED AND SENTINEL LYMPH NODE BIOPSY Left 04/13/2022   Procedure: LEFT BREAST BRACKETED LUMPECTOMY WITH RADIOACTIVE SEED AND AXILLARY SENTINEL LYMPH NODE BIOPSY;  Surgeon: Rolm Bookbinder, MD;  Location: [place];  Service: General;  Laterality: Left;   HYSTEROSCOPY W/ ENDOMETRIAL ABLATION  08/14/2020   [facility]   MASTOPEXY Bilateral 04/21/2022   Procedure: MASTOPEXY;  Surgeon: Irene Limbo, MD;  Location: [place];  Service: Plastics;  Laterality: Bilateral;   Patient Active Problem List   Diagnosis Date Noted   Anemia 12/31/2021   Malignant neoplasm of upper-inner quadrant of left breast in female, estrogen receptor positive ([place]) 12/31/2021   Open angle with borderline findings and low glaucoma risk in both eyes 01/11/2016   Chronic idiopathic urticaria 01/11/2016   Age-related nuclear cataract of both eyes  01/11/2016   Eczema 01/11/2016    PCP: [name], MD  REFERRING PROVIDER: Gery Pray, MD  REFERRING DIAG: C50.212,Z17.0 (ICD-10-CM) - Malignant neoplasm of upper-inner quadrant of left breast in female, estrogen receptor positive ([place])  THERAPY DIAG:  Lymphedema, not elsewhere classified  Stiffness of left shoulder, not elsewhere classified  Disorder of the skin and subcutaneous tissue related to radiation, unspecified  Malignant neoplasm of upper-inner quadrant of left breast in female, estrogen receptor positive ([place])  ONSET DATE: 12/09/21  Rationale for Evaluation and Treatment Rehabilitation  SUBJECTIVE  SUBJECTIVE STATEMENT: I think I am doing better.   PERTINENT HISTORY:  Patient was diagnosed on 12/09/21 with left grade 3. It measures 3 cm and is located in the upper inner quadrant. 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 04/13/22- underwent L breast lumpectomy and SLNB (0/3) 04/21/22- bilateral mastopexy  PAIN:  Are you having pain? No, not currently  PRECAUTIONS: Other: at risk of lymphedema  WEIGHT BEARING RESTRICTIONS No  FALLS:  Has patient fallen in last 6 months? 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  LEISURE: pt has been doing PT exercises 2x/wk and on the other days pt walks  HAND DOMINANCE : right   PRIOR LEVEL OF FUNCTION: Independent  PATIENT GOALS to avoid lymphedema, decrease pain   OBJECTIVE  COGNITION:  Overall cognitive status: Within functional limits for tasks assessed   PALPATION: 1 cord palpable in left lateral breast, fibrosis in L  axilla  OBSERVATIONS / OTHER ASSESSMENTS: fullness in L breast, fullness and fibrosis in L axilla, 1 cord extending from scar in L lateral breast extending to axilla  POSTURE: forward head, rounded shoulders  UPPER EXTREMITY AROM/PROM:  A/PROM RIGHT   eval   Shoulder extension 62  Shoulder flexion 155  Shoulder abduction 177  Shoulder internal rotation 70  Shoulder external rotation 88    (Blank rows = not tested)  A/PROM LEFT   eval LEFT 08/19/22 08/26/22 09/02/22  Shoulder extension 57 65    Shoulder flexion 160 with p! 175 160 with pull/tingle 165 with pull only  Shoulder abduction 125 172 173 174  Shoulder internal rotation 60 60    Shoulder external rotation 90 85      (Blank rows = not tested)    LYMPHEDEMA ASSESSMENTS:   SURGERY TYPE/DATE: 04/13/22 L lumpectomy and SLNB  NUMBER OF LYMPH NODES REMOVED: 0/3  CHEMOTHERAPY: Completed  RADIATION:completed  HORMONE TREATMENT: tamoxifen - currently taking  INFECTIONS: none  LYMPHEDEMA ASSESSMENTS:   LANDMARK RIGHT  eval  10 cm proximal to olecranon process 38  Olecranon process 31.5  10 cm proximal to ulnar styloid process 24  Just proximal to ulnar styloid process 18.5  Across hand at thumb web space 20.2  At base of 2nd digit 7  (Blank rows = not tested)  LANDMARK LEFT  eval  10 cm proximal to olecranon process 38  Olecranon process 31.5  10 cm proximal to ulnar styloid process 25  Just proximal to ulnar styloid process 19  Across hand at thumb web space 20  At base of 2nd digit 6.8  (Blank rows = not tested)   QUICK DASH SURVEY:       TODAY'S TREATMENT  09/07/22: Therapeutic Exercises Pulleys into flexion and abduction x2 mins each  Roll yellow ball up wall into flexion x10, then Lt abduction x10 each Modified Downward Dog on wall 5x, 5 sec holds Rt S/L over pink bolster with Lt UE OH reach for Lt intercostal/lateral trunk stretch 5x holding stretch and breathing in to stretch Manual  Therapy MLD: In Supine: Short neck, 5 diaphragmatic breaths, Lt inguinal and Rt axillary nodes, Lt axillo-inguinal and anterior inter-axillary anastomosis, then focused on Lt inferior breast, initially she had increased fibrosis which improved with stretching MFR to cording extending from lumpectomy scar at axilla; this was much softer  today PROM to L shoulder into flexion, abduction and D2 with full P/ROM  09/02/22: Therapeutic Exercises Pulleys into flexion and abduction x2 mins each  Roll yellow ball up wall into flexion x10, then Lt abduction x10 each Modified Downward Dog on wall 5x, 5 sec holds Rt S/L over pink bolster with Lt UE OH reach for Lt intercostal/lateral trunk stretch 5x, 5 sec holds with pt reporting still feeling good stretch with this Manual Therapy MLD: In Supine: Short neck, 5 diaphragmatic breaths, Lt inguinal and Rt axillary nodes, Lt axillo-inguinal and anterior inter-axillary anastomosis, then focused on Lt inferior breast, this area mch improved  MFR to cording extending from lumpectomy scar at axilla; this was much softer today PROM to L shoulder into flexion, abduction and D2 with full P/ROM  08/31/22: Therapeutic Exercises Pulleys into flexion and abduction x2 mins each  Roll yellow ball up wall into flexion x10, then Lt abduction x10 each Modified Downward Dog on wall 5x, 5 sec holds Rt S/L over pink bolster with Lt UE OH reach for Lt intercostal/lateral trunk stretch 5x, 5 sec holds returning therapist demo, pt also felt stretch in area of cording Manual Therapy MLD: In Supine: Short neck, 5 diaphragmatic breaths, Lt inguinal and Rt axillary nodes, Lt axillo-inguinal and anterior inter-axillary anastomosis, then focused on Lt inferior breast where some fibrosis palpable along with scar tissue but this felt much improved today and peau d'orange seemed less as well MFR to cording extending from lumpectomy scar at axilla PROM to L shoulder into flexion, abduction  and D2 with full P/ROM STM: Scar tissue mobs along incision inferior to Lt breast with pt in Rt S/L  08/26/22: Therapeutic Exercises Doorway Pectoralis Stretch in varying UE positions for different stretch 4-5x total x20 sec each Pulleys into flexion and abduction x2 mins each with VCs to decrease stretch Roll yellow ball up wall into flexion x10, then Lt abduction x10 each Supine low trunk rotation with bil UE's in horz abd for trunk stretch at incisions Manual Therapy MLD: In Supine: Short neck, 5 diaphragmatic breaths, Lt inguinal and Rt axillary nodes, Lt axillo-inguinal and anterior inter-axillary anastomosis, then focused on Lt inferior breast where some fibrosis palpable along with scar tissue MFR to cording extending from lumpectomy scar through L lateral breast and in to axilla; also with LTR a few times for MFR to anterior trunk PROM to L shoulder into flexion, abduction and D2 STM: Scar tissue mobs along incision inferior to Lt breast   PATIENT EDUCATION:  Education details: cording and importance of stretching to reduce cording, L breast lymphedema vs. Post op radiation edema, vs edema from cording Person educated: Patient Education method: Explanation Education comprehension: verbalized understanding   HOME EXERCISE PROGRAM: Stretch at end range and massage cording  ASSESSMENT:  CLINICAL IMPRESSION: Pt's ROM is much improved from eval but she still has tightness at end range secondary to cording. Her cord extends from her upper arm through axilla and her breast to just inferior to her breast. Focused on myofascial release to this area and MLD to decrease fibrosis in inferior breast which softened greatly following treatment.  Pt needs renewal next week and would like to decr freq to 1x/wk so she can be assessed while working towards D/C.   OBJECTIVE IMPAIRMENTS decreased ROM, increased edema, increased fascial restrictions, impaired UE functional use, postural dysfunction,  and pain.   ACTIVITY LIMITATIONS carrying, lifting, and reach over head  PARTICIPATION LIMITATIONS:  none  PERSONAL FACTORS  none  are also affecting patient's functional outcome.   REHAB POTENTIAL: Good  CLINICAL DECISION MAKING: Stable/uncomplicated  EVALUATION COMPLEXITY: Low  GOALS: Goals reviewed with patient? Yes  SHORT TERM GOALS=LONG TERM GOALS  Target date: 10/05/2022    Pt will be independent in self MLD for long term management of L breast edema.  Baseline: Goal status: ONGOING  2.  Pt will report a 75% improvement in pain in L breast, axilla and upper arm to allow improved comfort.  Baseline:  Goal status: INITIAL  3.  Pt will demonstrate 175 degrees of L shoulder abduction to allow her to reach out to the side.  Baseline: 125 Goal status: INITIAL  4.  Pt will be independent in a home exercise program for long term stretching and strengthening.  Baseline:  Goal status: INITIAL    PLAN: PT FREQUENCY: 2x/week  PT DURATION: 4 weeks  PLANNED INTERVENTIONS: Therapeutic exercises, Therapeutic activity, Patient/Family education, Self Care, Joint mobilization, Manual lymph drainage, Compression bandaging, scar mobilization, Taping, Vasopneumatic device, and Manual therapy  PLAN FOR NEXT SESSION: Cont MLD to L breast, MFR to cording in L lateral breast and axilla, MLD to L axilla, PROM to L shoulder, pulleys, ball   [employer], PT 09/07/2022, 10:58 AM

## 2022-09-09 ENCOUNTER — Encounter: Payer: Self-pay | Admitting: Physical Therapy

## 2022-09-09 ENCOUNTER — Ambulatory Visit: Payer: 59 | Admitting: Physical Therapy

## 2022-09-09 DIAGNOSIS — I890 Lymphedema, not elsewhere classified: Secondary | ICD-10-CM

## 2022-09-09 DIAGNOSIS — M25612 Stiffness of left shoulder, not elsewhere classified: Secondary | ICD-10-CM

## 2022-09-09 DIAGNOSIS — L599 Disorder of the skin and subcutaneous tissue related to radiation, unspecified: Secondary | ICD-10-CM

## 2022-09-09 DIAGNOSIS — C50212 Malignant neoplasm of upper-inner quadrant of left female breast: Secondary | ICD-10-CM

## 2022-09-09 NOTE — Patient Instructions (Signed)
Self manual lymph drainage: Perform this sequence once a day.  Only give enough pressure no your skin to make the skin move.  Diaphragmatic - Supine   Inhale through nose making navel move out toward hands. Exhale through puckered lips, hands follow navel in. Repeat _5__ times. Rest _10__ seconds between repeats.   Copyright  VHI. All rights reserved.  Hug yourself.  Do circles at your neck just above your collarbones.  Repeat this 10 times.  Axilla - One at a Time   Using full weight of flat hand and fingers at center of uninvolved armpit, make _10__ in-place circles.   Copyright  VHI. All rights reserved.  LEG: Inguinal Nodes Stimulation   With small finger side of hand against hip crease on involved side, gently perform circles at the crease. Repeat __10_ times.   Copyright  VHI. All rights reserved.  Axilla to Inguinal Nodes - Sweep   On involved side, stretch skin _4__ times from armpit along side of trunk to hip crease.  Now gently stretch skin from the involved side to the uninvolved side across the chest at the shoulder line.  Repeat that 4 times.  Draw an imaginary diagonal line from upper outer breast through the nipple area toward lower inner breast.  Direct fluid upward and inward from this line toward the pathway across your upper chest .  Do this in three rows to treat all of the upper inner breast tissue, and do each row 3-4x.      Direct fluid to treat all of lower outer breast tissue downward and outward toward      pathway that is aimed at the left groin.  Finish by doing the pathways as described above going from your involved armpit to the same side groin and going across your upper chest from the involved shoulder to the uninvolved shoulder.  Repeat the steps above where you do circles in your left groin and right armpit. Copyright  VHI. All rights reserved.   

## 2022-09-09 NOTE — Therapy (Signed)
OUTPATIENT PHYSICAL THERAPY ONCOLOGY TREATMENT  Patient Name: Maria Diaz MRN: [id number] DOB:28-Sep-1971, 51 y.o., female Today's Date: 09/09/2022   PT End of Session - 09/09/22 1015     Visit Number 9    Number of Visits 13    Date for PT Re-Evaluation 10/07/22    PT Start Time 1007    PT Stop Time 1058    PT Time Calculation (min) 51 min    Activity Tolerance Patient tolerated treatment well    Behavior During Therapy [facility] for tasks assessed/performed             Past Medical History:  Diagnosis Date   Anemia    Anxiety    Depression    Glaucoma    History of radiation therapy    Left breast 05/28/22-07/06/22-Dr. Gery Pray   Hives    chronic, on Xolair injections   Past Surgical History:  Procedure Laterality Date   BREAST BIOPSY Left 01/19/2022   BREAST CYST EXCISION Left 01/28/2021   Procedure: LEFT BREAST MASS EXCISION;  Surgeon: Rolm Bookbinder, MD;  Location: [place];  Service: General;  Laterality: Left;  START TIME OF 3:00 PM FOR 60 MINUTES IN ROOM 8   BREAST LUMPECTOMY WITH RADIOACTIVE SEED AND SENTINEL LYMPH NODE BIOPSY Left 04/13/2022   Procedure: LEFT BREAST BRACKETED LUMPECTOMY WITH RADIOACTIVE SEED AND AXILLARY SENTINEL LYMPH NODE BIOPSY;  Surgeon: Rolm Bookbinder, MD;  Location: [place];  Service: General;  Laterality: Left;   HYSTEROSCOPY W/ ENDOMETRIAL ABLATION  08/14/2020   [facility]   MASTOPEXY Bilateral 04/21/2022   Procedure: MASTOPEXY;  Surgeon: Irene Limbo, MD;  Location: [place];  Service: Plastics;  Laterality: Bilateral;   Patient Active Problem List   Diagnosis Date Noted   Anemia 12/31/2021   Malignant neoplasm of upper-inner quadrant of left breast in female, estrogen receptor positive ([place]) 12/31/2021   Open angle with borderline findings and low glaucoma risk in both eyes 01/11/2016   Chronic idiopathic urticaria 01/11/2016   Age-related nuclear cataract of both eyes  01/11/2016   Eczema 01/11/2016    PCP: [name], MD  REFERRING PROVIDER: Gery Pray, MD  REFERRING DIAG: C50.212,Z17.0 (ICD-10-CM) - Malignant neoplasm of upper-inner quadrant of left breast in female, estrogen receptor positive ([place])  THERAPY DIAG:  Lymphedema, not elsewhere classified  Stiffness of left shoulder, not elsewhere classified  Disorder of the skin and subcutaneous tissue related to radiation, unspecified  Malignant neoplasm of upper-inner quadrant of left breast in female, estrogen receptor positive ([place])  ONSET DATE: 12/09/21  Rationale for Evaluation and Treatment Rehabilitation  SUBJECTIVE  SUBJECTIVE STATEMENT: The shoulder feels good. I was having pain this morning across my chest.   PERTINENT HISTORY:  Patient was diagnosed on 12/09/21 with left grade 3. It measures 3 cm and is located in the upper inner quadrant. 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 04/13/22- underwent L breast lumpectomy and SLNB (0/3) 04/21/22- bilateral mastopexy  PAIN:  Are you having pain? No, not currently  PRECAUTIONS: Other: at risk of lymphedema  WEIGHT BEARING RESTRICTIONS No  FALLS:  Has patient fallen in last 6 months? 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  LEISURE: pt has been doing PT exercises 2x/wk and on the other days pt walks  HAND DOMINANCE : right   PRIOR LEVEL OF FUNCTION: Independent  PATIENT GOALS to avoid lymphedema, decrease pain   OBJECTIVE  COGNITION:  Overall cognitive status: Within functional limits for tasks assessed   PALPATION: 1 cord palpable  in left lateral breast, fibrosis in L axilla  OBSERVATIONS / OTHER ASSESSMENTS: fullness in L breast, fullness and fibrosis in L axilla, 1 cord extending from scar in L lateral breast extending to axilla  POSTURE: forward head, rounded shoulders  UPPER EXTREMITY AROM/PROM:  A/PROM RIGHT   eval   Shoulder extension 62  Shoulder flexion 155  Shoulder abduction 177  Shoulder internal rotation 70  Shoulder external rotation 88    (Blank rows = not tested)  A/PROM LEFT   eval LEFT 08/19/22 08/26/22 09/02/22  Shoulder extension 57 65    Shoulder flexion 160 with p! 175 160 with pull/tingle 165 with pull only  Shoulder abduction 125 172 173 174  Shoulder internal rotation 60 60    Shoulder external rotation 90 85      (Blank rows = not tested)    LYMPHEDEMA ASSESSMENTS:   SURGERY TYPE/DATE: 04/13/22 L lumpectomy and SLNB  NUMBER OF LYMPH NODES REMOVED: 0/3  CHEMOTHERAPY: Completed  RADIATION:completed  HORMONE TREATMENT: tamoxifen - currently taking  INFECTIONS: none  LYMPHEDEMA ASSESSMENTS:   LANDMARK RIGHT  eval  10 cm proximal to olecranon process 38  Olecranon process 31.5  10 cm proximal to ulnar styloid process 24  Just proximal to ulnar styloid process 18.5  Across hand at thumb web space 20.2  At base of 2nd digit 7  (Blank rows = not tested)  LANDMARK LEFT  eval  10 cm proximal to olecranon process 38  Olecranon process 31.5  10 cm proximal to ulnar styloid process 25  Just proximal to ulnar styloid process 19  Across hand at thumb web space 20  At base of 2nd digit 6.8  (Blank rows = not tested)   QUICK DASH SURVEY:       TODAY'S TREATMENT  09/09/22: Therapeutic Exercises Pulleys into flexion and abduction x2 mins each  Roll yellow ball up wall into flexion x10, then Lt abduction x10 each  Manual Therapy MLD: In Supine: Short neck, 5 diaphragmatic breaths, Lt inguinal and Rt axillary nodes, Lt axillo-inguinal and anterior inter-axillary  anastomosis, then focused on Lt inferior breast, instructed pt throughout and issued handout and had pt return demonstrate entire sequence while providing verbal and tactile cues MFR to cording extending from lumpectomy scar at axilla through lateral breast; this was much softer today PROM to L shoulder into flexion, abduction and D2 with  full P/ROM  09/07/22: Therapeutic Exercises Pulleys into flexion and abduction x2 mins each  Roll yellow ball up wall into flexion x10, then Lt abduction x10 each Modified Downward Dog on wall 5x, 5 sec holds Rt S/L over pink bolster with Lt UE OH reach for Lt intercostal/lateral trunk stretch 5x holding stretch and breathing in to stretch Manual Therapy MLD: In Supine: Short neck, 5 diaphragmatic breaths, Lt inguinal and Rt axillary nodes, Lt axillo-inguinal and anterior inter-axillary anastomosis, then focused on Lt inferior breast, initially she had increased fibrosis which improved with stretching MFR to cording extending from lumpectomy scar at axilla; this was much softer today PROM to L shoulder into flexion, abduction and D2 with full P/ROM  09/02/22: Therapeutic Exercises Pulleys into flexion and abduction x2 mins each  Roll yellow ball up wall into flexion x10, then Lt abduction x10 each Modified Downward Dog on wall 5x, 5 sec holds Rt S/L over pink bolster with Lt UE OH reach for Lt intercostal/lateral trunk stretch 5x, 5 sec holds with pt reporting still feeling good stretch with this Manual Therapy MLD: In Supine: Short neck, 5 diaphragmatic breaths, Lt inguinal and Rt axillary nodes, Lt axillo-inguinal and anterior inter-axillary anastomosis, then focused on Lt inferior breast, this area mch improved  MFR to cording extending from lumpectomy scar at axilla; this was much softer today PROM to L shoulder into flexion, abduction and D2 with full P/ROM  08/31/22: Therapeutic Exercises Pulleys into flexion and abduction x2 mins each  Roll yellow  ball up wall into flexion x10, then Lt abduction x10 each Modified Downward Dog on wall 5x, 5 sec holds Rt S/L over pink bolster with Lt UE OH reach for Lt intercostal/lateral trunk stretch 5x, 5 sec holds returning therapist demo, pt also felt stretch in area of cording Manual Therapy MLD: In Supine: Short neck, 5 diaphragmatic breaths, Lt inguinal and Rt axillary nodes, Lt axillo-inguinal and anterior inter-axillary anastomosis, then focused on Lt inferior breast where some fibrosis palpable along with scar tissue but this felt much improved today and peau d'orange seemed less as well MFR to cording extending from lumpectomy scar at axilla PROM to L shoulder into flexion, abduction and D2 with full P/ROM STM: Scar tissue mobs along incision inferior to Lt breast with pt in Rt S/L  08/26/22: Therapeutic Exercises Doorway Pectoralis Stretch in varying UE positions for different stretch 4-5x total x20 sec each Pulleys into flexion and abduction x2 mins each with VCs to decrease stretch Roll yellow ball up wall into flexion x10, then Lt abduction x10 each Supine low trunk rotation with bil UE's in horz abd for trunk stretch at incisions Manual Therapy MLD: In Supine: Short neck, 5 diaphragmatic breaths, Lt inguinal and Rt axillary nodes, Lt axillo-inguinal and anterior inter-axillary anastomosis, then focused on Lt inferior breast where some fibrosis palpable along with scar tissue MFR to cording extending from lumpectomy scar through L lateral breast and in to axilla; also with LTR a few times for MFR to anterior trunk PROM to L shoulder into flexion, abduction and D2 STM: Scar tissue mobs along incision inferior to Lt breast   PATIENT EDUCATION:  Education details: cording and importance of stretching to reduce cording, L breast lymphedema vs. Post op radiation edema, vs edema from cording Person educated: Patient Education method: Explanation Education comprehension: verbalized  understanding   HOME EXERCISE PROGRAM: Stretch at end range and massage cording  ASSESSMENT:  CLINICAL IMPRESSION: Assessed pt's progress towards goals in therapy. She  has met her ROM goals. Began instructing pt in self MLD technique for her L breast today and had pt return demonstrate correct sequence and skin stretch. Will decrease pt to 1x/wk for 4 weeks. She would benefit from continued skilled PT services to continue to progress her home exercise program and progress her towards independence with self MLD.   OBJECTIVE IMPAIRMENTS decreased ROM, increased edema, increased fascial restrictions, impaired UE functional use, postural dysfunction, and pain.   ACTIVITY LIMITATIONS carrying, lifting, and reach over head  PARTICIPATION LIMITATIONS:  none  PERSONAL FACTORS  none  are also affecting patient's functional outcome.   REHAB POTENTIAL: Good  CLINICAL DECISION MAKING: Stable/uncomplicated  EVALUATION COMPLEXITY: Low  GOALS: Goals reviewed with patient? Yes  SHORT TERM GOALS=LONG TERM GOALS  Target date: 10/07/2022    Pt will be independent in self MLD for long term management of L breast edema.  Baseline: Goal status: ONGOING  2.  Pt will report a 75% improvement in pain in L breast, axilla and upper arm to allow improved comfort.  Baseline:  Goal status: MET 09/09/22 75% improvement  3.  Pt will demonstrate 175 degrees of L shoulder abduction to allow her to reach out to the side.  Baseline: 125 Goal status: MET 09/09/22- 179  4.  Pt will be independent in a home exercise program for long term stretching and strengthening.  Baseline:  Goal status: IN PROGRESS    PLAN: PT FREQUENCY: 1x/week  PT DURATION: 4 weeks  PLANNED INTERVENTIONS: Therapeutic exercises, Therapeutic activity, Patient/Family education, Self Care, Joint mobilization, Manual lymph drainage, Compression bandaging, scar mobilization, Taping, Vasopneumatic device, and Manual therapy  PLAN  FOR NEXT SESSION: Cont MLD to L breast and instruct pt , MFR to cording in L lateral breast and axilla, MLD to L axilla, PROM to L shoulder, pulleys, ball   [employer], PT 09/09/2022, 11:49 AM

## 2022-09-10 ENCOUNTER — Other Ambulatory Visit: Payer: Self-pay | Admitting: Psychiatry

## 2022-09-14 ENCOUNTER — Ambulatory Visit (INDEPENDENT_AMBULATORY_CARE_PROVIDER_SITE_OTHER): Payer: 59 | Admitting: Psychiatry

## 2022-09-14 DIAGNOSIS — F3341 Major depressive disorder, recurrent, in partial remission: Secondary | ICD-10-CM

## 2022-09-14 DIAGNOSIS — F4310 Post-traumatic stress disorder, unspecified: Secondary | ICD-10-CM | POA: Diagnosis not present
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
## 2022-09-16 ENCOUNTER — Encounter: Payer: Self-pay | Admitting: Psychiatry

## 2022-09-16 ENCOUNTER — Telehealth (INDEPENDENT_AMBULATORY_CARE_PROVIDER_SITE_OTHER): Payer: 59 | Admitting: Psychiatry

## 2022-09-16 DIAGNOSIS — F4310 Post-traumatic stress disorder, unspecified: Secondary | ICD-10-CM | POA: Diagnosis not present

## 2022-09-16 DIAGNOSIS — F3341 Major depressive disorder, recurrent, in partial remission: Secondary | ICD-10-CM

## 2022-09-16 DIAGNOSIS — G4700 Insomnia, unspecified: Secondary | ICD-10-CM | POA: Diagnosis not present

## 2022-09-16 NOTE — Patient Instructions (Signed)
Continue sertraline 200 mg daily  Continue hydroxyzine 25 mg daily as needed for anxiety  Continue prazosin 2 mg at night Continue Trazodone 50-100 mg at night as needed for insomnia Next appointment- 12/20 at 11 AM

## 2022-09-18 ENCOUNTER — Ambulatory Visit: Payer: 59 | Admitting: Physical Therapy

## 2022-09-18 ENCOUNTER — Encounter: Payer: Self-pay | Admitting: Physical Therapy

## 2022-09-18 DIAGNOSIS — I890 Lymphedema, not elsewhere classified: Secondary | ICD-10-CM

## 2022-09-18 DIAGNOSIS — Z170 Estrogen receptor positive status [ER+]: Secondary | ICD-10-CM

## 2022-09-18 DIAGNOSIS — M25612 Stiffness of left shoulder, not elsewhere classified: Secondary | ICD-10-CM

## 2022-09-18 DIAGNOSIS — L599 Disorder of the skin and subcutaneous tissue related to radiation, unspecified: Secondary | ICD-10-CM

## 2022-09-18 NOTE — Therapy (Signed)
OUTPATIENT PHYSICAL THERAPY ONCOLOGY TREATMENT  Patient Name: Maria Diaz MRN: [id number] DOB:1971-11-03, 51 y.o., female Today's Date: 09/18/2022   PT End of Session - 09/18/22 1013     Visit Number 10    Number of Visits 13    Date for PT Re-Evaluation 10/07/22    PT Start Time 1004    PT Stop Time 1049    PT Time Calculation (min) 45 min    Activity Tolerance Patient tolerated treatment well    Behavior During Therapy [facility] for tasks assessed/performed             Past Medical History:  Diagnosis Date   Anemia    Anxiety    Depression    Glaucoma    History of radiation therapy    Left breast 05/28/22-07/06/22-Dr. Gery Pray   Hives    chronic, on Xolair injections   Past Surgical History:  Procedure Laterality Date   BREAST BIOPSY Left 01/19/2022   BREAST CYST EXCISION Left 01/28/2021   Procedure: LEFT BREAST MASS EXCISION;  Surgeon: Rolm Bookbinder, MD;  Location: [place];  Service: General;  Laterality: Left;  START TIME OF 3:00 PM FOR 60 MINUTES IN ROOM 8   BREAST LUMPECTOMY WITH RADIOACTIVE SEED AND SENTINEL LYMPH NODE BIOPSY Left 04/13/2022   Procedure: LEFT BREAST BRACKETED LUMPECTOMY WITH RADIOACTIVE SEED AND AXILLARY SENTINEL LYMPH NODE BIOPSY;  Surgeon: Rolm Bookbinder, MD;  Location: [place];  Service: General;  Laterality: Left;   HYSTEROSCOPY W/ ENDOMETRIAL ABLATION  08/14/2020   [facility]   MASTOPEXY Bilateral 04/21/2022   Procedure: MASTOPEXY;  Surgeon: Irene Limbo, MD;  Location: [place];  Service: Plastics;  Laterality: Bilateral;   Patient Active Problem List   Diagnosis Date Noted   Anemia 12/31/2021   Malignant neoplasm of upper-inner quadrant of left breast in female, estrogen receptor positive ([place]) 12/31/2021   Open angle with borderline findings and low glaucoma risk in both eyes 01/11/2016   Chronic idiopathic urticaria 01/11/2016   Age-related nuclear cataract of both eyes  01/11/2016   Eczema 01/11/2016    PCP: [name], MD  REFERRING PROVIDER: Gery Pray, MD  REFERRING DIAG: C50.212,Z17.0 (ICD-10-CM) - Malignant neoplasm of upper-inner quadrant of left breast in female, estrogen receptor positive ([place])  THERAPY DIAG:  Lymphedema, not elsewhere classified  Stiffness of left shoulder, not elsewhere classified  Disorder of the skin and subcutaneous tissue related to radiation, unspecified  Malignant neoplasm of upper-inner quadrant of left breast in female, estrogen receptor positive ([place])  ONSET DATE: 12/09/21  Rationale for Evaluation and Treatment Rehabilitation  SUBJECTIVE  SUBJECTIVE STATEMENT: I feel really tight today.  PERTINENT HISTORY:  Patient was diagnosed on 12/09/21 with left grade 3. It measures 3 cm and is located in the upper inner quadrant. 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 04/13/22- underwent L breast lumpectomy and SLNB (0/3) 04/21/22- bilateral mastopexy  PAIN:  Are you having pain? No, just really tight  PRECAUTIONS: Other: at risk of lymphedema  WEIGHT BEARING RESTRICTIONS No  FALLS:  Has patient fallen in last 6 months? 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  LEISURE: pt has been doing PT exercises 2x/wk and on the other days pt walks  HAND DOMINANCE : right   PRIOR LEVEL OF FUNCTION: Independent  PATIENT GOALS to avoid lymphedema, decrease pain   OBJECTIVE  COGNITION:  Overall cognitive status: Within functional limits for tasks assessed   PALPATION: 1 cord palpable in left lateral breast, fibrosis in L  axilla  OBSERVATIONS / OTHER ASSESSMENTS: fullness in L breast, fullness and fibrosis in L axilla, 1 cord extending from scar in L lateral breast extending to axilla  POSTURE: forward head, rounded shoulders  UPPER EXTREMITY AROM/PROM:  A/PROM RIGHT   eval   Shoulder extension 62  Shoulder flexion 155  Shoulder abduction 177  Shoulder internal rotation 70  Shoulder external rotation 88    (Blank rows = not tested)  A/PROM LEFT   eval LEFT 08/19/22 08/26/22 09/02/22  Shoulder extension 57 65    Shoulder flexion 160 with p! 175 160 with pull/tingle 165 with pull only  Shoulder abduction 125 172 173 174  Shoulder internal rotation 60 60    Shoulder external rotation 90 85      (Blank rows = not tested)    LYMPHEDEMA ASSESSMENTS:   SURGERY TYPE/DATE: 04/13/22 L lumpectomy and SLNB  NUMBER OF LYMPH NODES REMOVED: 0/3  CHEMOTHERAPY: Completed  RADIATION:completed  HORMONE TREATMENT: tamoxifen - currently taking  INFECTIONS: none  LYMPHEDEMA ASSESSMENTS:   LANDMARK RIGHT  eval  10 cm proximal to olecranon process 38  Olecranon process 31.5  10 cm proximal to ulnar styloid process 24  Just proximal to ulnar styloid process 18.5  Across hand at thumb web space 20.2  At base of 2nd digit 7  (Blank rows = not tested)  LANDMARK LEFT  eval  10 cm proximal to olecranon process 38  Olecranon process 31.5  10 cm proximal to ulnar styloid process 25  Just proximal to ulnar styloid process 19  Across hand at thumb web space 20  At base of 2nd digit 6.8  (Blank rows = not tested)   QUICK DASH SURVEY:       TODAY'S TREATMENT   09/09/22: Therapeutic Exercises Pulleys into flexion and abduction x2 mins each  Roll yellow ball up wall into flexion x10, then Lt abduction x10 each  Manual Therapy MLD: In Supine: Short neck, 5 diaphragmatic breaths, Lt inguinal and Rt axillary nodes, Lt axillo-inguinal and anterior inter-axillary anastomosis, then focused on Lt  inferior breast, then retraced all steps. Spent extra time in inferior breast where there was increased fibrosis that softened with treatment today. MFR to cording extending from lumpectomy scar at axilla through lateral breast; this was much softer today PROM to L shoulder into flexion, abduction and D2 with full P/ROM  09/09/22: Therapeutic Exercises  Pulleys into flexion and abduction x2 mins each  Roll yellow ball up wall into flexion x10, then Lt abduction x10 each  Manual Therapy MLD: In Supine: Short neck, 5 diaphragmatic breaths, Lt inguinal and Rt axillary nodes, Lt axillo-inguinal and anterior inter-axillary anastomosis, then focused on Lt inferior breast, instructed pt throughout and issued handout and had pt return demonstrate entire sequence while providing verbal and tactile cues MFR to cording extending from lumpectomy scar at axilla through lateral breast; this was much softer today PROM to L shoulder into flexion, abduction and D2 with full P/ROM  09/07/22: Therapeutic Exercises Pulleys into flexion and abduction x2 mins each  Roll yellow ball up wall into flexion x10, then Lt abduction x10 each Modified Downward Dog on wall 5x, 5 sec holds Rt S/L over pink bolster with Lt UE OH reach for Lt intercostal/lateral trunk stretch 5x holding stretch and breathing in to stretch Manual Therapy MLD: In Supine: Short neck, 5 diaphragmatic breaths, Lt inguinal and Rt axillary nodes, Lt axillo-inguinal and anterior inter-axillary anastomosis, then focused on Lt inferior breast, initially she had increased fibrosis which improved with stretching MFR to cording extending from lumpectomy scar at axilla; this was much softer today PROM to L shoulder into flexion, abduction and D2 with full P/ROM  09/02/22: Therapeutic Exercises Pulleys into flexion and abduction x2 mins each  Roll yellow ball up wall into flexion x10, then Lt abduction x10 each Modified Downward Dog on wall 5x, 5 sec  holds Rt S/L over pink bolster with Lt UE OH reach for Lt intercostal/lateral trunk stretch 5x, 5 sec holds with pt reporting still feeling good stretch with this Manual Therapy MLD: In Supine: Short neck, 5 diaphragmatic breaths, Lt inguinal and Rt axillary nodes, Lt axillo-inguinal and anterior inter-axillary anastomosis, then focused on Lt inferior breast, this area mch improved  MFR to cording extending from lumpectomy scar at axilla; this was much softer today PROM to L shoulder into flexion, abduction and D2 with full P/ROM  08/31/22: Therapeutic Exercises Pulleys into flexion and abduction x2 mins each  Roll yellow ball up wall into flexion x10, then Lt abduction x10 each Modified Downward Dog on wall 5x, 5 sec holds Rt S/L over pink bolster with Lt UE OH reach for Lt intercostal/lateral trunk stretch 5x, 5 sec holds returning therapist demo, pt also felt stretch in area of cording Manual Therapy MLD: In Supine: Short neck, 5 diaphragmatic breaths, Lt inguinal and Rt axillary nodes, Lt axillo-inguinal and anterior inter-axillary anastomosis, then focused on Lt inferior breast where some fibrosis palpable along with scar tissue but this felt much improved today and peau d'orange seemed less as well MFR to cording extending from lumpectomy scar at axilla PROM to L shoulder into flexion, abduction and D2 with full P/ROM STM: Scar tissue mobs along incision inferior to Lt breast with pt in Rt S/L  08/26/22: Therapeutic Exercises Doorway Pectoralis Stretch in varying UE positions for different stretch 4-5x total x20 sec each Pulleys into flexion and abduction x2 mins each with VCs to decrease stretch Roll yellow ball up wall into flexion x10, then Lt abduction x10 each Supine low trunk rotation with bil UE's in horz abd for trunk stretch at incisions Manual Therapy MLD: In Supine: Short neck, 5 diaphragmatic breaths, Lt inguinal and Rt axillary nodes, Lt axillo-inguinal and anterior  inter-axillary anastomosis, then focused on Lt inferior breast where some fibrosis palpable along with scar tissue MFR to cording extending from lumpectomy scar through L lateral  breast and in to axilla; also with LTR a few times for MFR to anterior trunk PROM to L shoulder into flexion, abduction and D2 STM: Scar tissue mobs along incision inferior to Lt breast   PATIENT EDUCATION:  Education details: cording and importance of stretching to reduce cording, L breast lymphedema vs. Post op radiation edema, vs edema from cording Person educated: Patient Education method: Explanation Education comprehension: verbalized understanding   HOME EXERCISE PROGRAM: Stretch at end range and massage cording  ASSESSMENT:  CLINICAL IMPRESSION: Pt was feeling more tight today and this was most likely due to not having any PT appointments last week. She has been trying the self massage and does not have any questions at this time. She still has several cords in her L axilla that are causing increased tightness though it improves following manual therapy.   OBJECTIVE IMPAIRMENTS decreased ROM, increased edema, increased fascial restrictions, impaired UE functional use, postural dysfunction, and pain.   ACTIVITY LIMITATIONS carrying, lifting, and reach over head  PARTICIPATION LIMITATIONS:  none  PERSONAL FACTORS  none  are also affecting patient's functional outcome.   REHAB POTENTIAL: Good  CLINICAL DECISION MAKING: Stable/uncomplicated  EVALUATION COMPLEXITY: Low  GOALS: Goals reviewed with patient? Yes  SHORT TERM GOALS=LONG TERM GOALS  Target date: 10/16/2022    Pt will be independent in self MLD for long term management of L breast edema.  Baseline: Goal status: ONGOING  2.  Pt will report a 75% improvement in pain in L breast, axilla and upper arm to allow improved comfort.  Baseline:  Goal status: MET 09/09/22 75% improvement  3.  Pt will demonstrate 175 degrees of L shoulder  abduction to allow her to reach out to the side.  Baseline: 125 Goal status: MET 09/09/22- 179  4.  Pt will be independent in a home exercise program for long term stretching and strengthening.  Baseline:  Goal status: IN PROGRESS    PLAN: PT FREQUENCY: 1x/week  PT DURATION: 4 weeks  PLANNED INTERVENTIONS: Therapeutic exercises, Therapeutic activity, Patient/Family education, Self Care, Joint mobilization, Manual lymph drainage, Compression bandaging, scar mobilization, Taping, Vasopneumatic device, and Manual therapy  PLAN FOR NEXT SESSION: Cont MLD to L breast and instruct pt , MFR to cording in L lateral breast and axilla, MLD to L axilla, PROM to L shoulder, pulleys, ball   [employer], PT 09/18/2022, 10:53 AM

## 2022-09-19 ENCOUNTER — Other Ambulatory Visit: Payer: Self-pay | Admitting: Psychiatry

## 2022-09-23 ENCOUNTER — Encounter: Payer: Self-pay | Admitting: Rehabilitation

## 2022-09-23 ENCOUNTER — Ambulatory Visit: Payer: 59 | Attending: Radiation Oncology | Admitting: Rehabilitation

## 2022-09-23 DIAGNOSIS — C50212 Malignant neoplasm of upper-inner quadrant of left female breast: Secondary | ICD-10-CM | POA: Insufficient documentation

## 2022-09-23 DIAGNOSIS — R293 Abnormal posture: Secondary | ICD-10-CM | POA: Diagnosis present

## 2022-09-23 DIAGNOSIS — Z170 Estrogen receptor positive status [ER+]: Secondary | ICD-10-CM | POA: Insufficient documentation

## 2022-09-23 DIAGNOSIS — M6281 Muscle weakness (generalized): Secondary | ICD-10-CM | POA: Diagnosis present

## 2022-09-23 DIAGNOSIS — I890 Lymphedema, not elsewhere classified: Secondary | ICD-10-CM | POA: Diagnosis not present

## 2022-09-23 DIAGNOSIS — Z171 Estrogen receptor negative status [ER-]: Secondary | ICD-10-CM | POA: Diagnosis present

## 2022-09-23 DIAGNOSIS — Z483 Aftercare following surgery for neoplasm: Secondary | ICD-10-CM | POA: Insufficient documentation

## 2022-09-23 DIAGNOSIS — M25612 Stiffness of left shoulder, not elsewhere classified: Secondary | ICD-10-CM | POA: Insufficient documentation

## 2022-09-23 NOTE — Therapy (Signed)
OUTPATIENT PHYSICAL THERAPY ONCOLOGY TREATMENT  Patient Name: Maria Diaz MRN: [id number] DOB:10-07-1971, 51 y.o., female Today's Date: 09/23/2022   PT End of Session - 09/23/22 1000     Visit Number 11    Number of Visits 13    Date for PT Re-Evaluation 10/07/22    PT Start Time 1000    PT Stop Time 1055    PT Time Calculation (min) 55 min    Activity Tolerance Patient tolerated treatment well    Behavior During Therapy [facility] for tasks assessed/performed              Past Medical History:  Diagnosis Date   Anemia    Anxiety    Depression    Glaucoma    History of radiation therapy    Left breast 05/28/22-07/06/22-Dr. Gery Pray   Hives    chronic, on Xolair injections   Past Surgical History:  Procedure Laterality Date   BREAST BIOPSY Left 01/19/2022   BREAST CYST EXCISION Left 01/28/2021   Procedure: LEFT BREAST MASS EXCISION;  Surgeon: Rolm Bookbinder, MD;  Location: [place];  Service: General;  Laterality: Left;  START TIME OF 3:00 PM FOR 60 MINUTES IN ROOM 8   BREAST LUMPECTOMY WITH RADIOACTIVE SEED AND SENTINEL LYMPH NODE BIOPSY Left 04/13/2022   Procedure: LEFT BREAST BRACKETED LUMPECTOMY WITH RADIOACTIVE SEED AND AXILLARY SENTINEL LYMPH NODE BIOPSY;  Surgeon: Rolm Bookbinder, MD;  Location: [place];  Service: General;  Laterality: Left;   HYSTEROSCOPY W/ ENDOMETRIAL ABLATION  08/14/2020   [facility]   MASTOPEXY Bilateral 04/21/2022   Procedure: MASTOPEXY;  Surgeon: Irene Limbo, MD;  Location: [place];  Service: Plastics;  Laterality: Bilateral;   Patient Active Problem List   Diagnosis Date Noted   Anemia 12/31/2021   Malignant neoplasm of upper-inner quadrant of left breast in female, estrogen receptor positive ([place]) 12/31/2021   Open angle with borderline findings and low glaucoma risk in both eyes 01/11/2016   Chronic idiopathic urticaria 01/11/2016   Age-related nuclear cataract of both eyes  01/11/2016   Eczema 01/11/2016    PCP: [name], MD  REFERRING PROVIDER: Gery Pray, MD  REFERRING DIAG: C50.212,Z17.0 (ICD-10-CM) - Malignant neoplasm of upper-inner quadrant of left breast in female, estrogen receptor positive ([place])  THERAPY DIAG:  Lymphedema, not elsewhere classified  Malignant neoplasm of upper-inner quadrant of left breast in female, estrogen receptor positive ([place])  Aftercare following surgery for neoplasm  Stiffness of left shoulder, not elsewhere classified  Malignant neoplasm of upper-inner quadrant of left breast in female, estrogen receptor negative ([place])  Abnormal posture  Muscle weakness (generalized)  ONSET DATE: 12/09/21  Rationale for Evaluation and Treatment Rehabilitation  SUBJECTIVE  SUBJECTIVE STATEMENT: She is feeling a little tight today but better than usual.  PERTINENT HISTORY:  Patient was diagnosed on 12/09/21 with left grade 3. It measures 3 cm and is located in the upper inner quadrant. 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 04/13/22- underwent L breast lumpectomy and SLNB (0/3) 04/21/22- bilateral mastopexy  PAIN:  Are you having pain? No, just really tight  PRECAUTIONS: Other: at risk of lymphedema  WEIGHT BEARING RESTRICTIONS No  FALLS:  Has patient fallen in last 6 months? 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  LEISURE: pt has been doing PT exercises 2x/wk and on the other days pt walks  HAND DOMINANCE : right   PRIOR LEVEL OF FUNCTION: Independent  PATIENT GOALS to avoid lymphedema, decrease  pain   OBJECTIVE  COGNITION:  Overall cognitive status: Within functional limits for tasks assessed   PALPATION: 1 cord palpable in left lateral breast, fibrosis in L axilla  OBSERVATIONS / OTHER ASSESSMENTS: fullness in L breast, fullness and fibrosis in L axilla, 1 cord extending from scar in L lateral breast extending to axilla  POSTURE: forward head, rounded shoulders  UPPER EXTREMITY AROM/PROM:  A/PROM RIGHT   eval   Shoulder extension 62  Shoulder flexion 155  Shoulder abduction 177  Shoulder internal rotation 70  Shoulder external rotation 88    (Blank rows = not tested)  A/PROM LEFT   eval LEFT 08/19/22 08/26/22 09/02/22  Shoulder extension 57 65    Shoulder flexion 160 with p! 175 160 with pull/tingle 165 with pull only  Shoulder abduction 125 172 173 174  Shoulder internal rotation 60 60    Shoulder external rotation 90 85      (Blank rows = not tested)    LYMPHEDEMA ASSESSMENTS:   SURGERY TYPE/DATE: 04/13/22 L lumpectomy and SLNB  NUMBER OF LYMPH NODES REMOVED: 0/3  CHEMOTHERAPY: Completed  RADIATION:completed  HORMONE TREATMENT: tamoxifen - currently taking  INFECTIONS: none  LYMPHEDEMA ASSESSMENTS:   LANDMARK RIGHT  eval  10 cm proximal to olecranon process 38  Olecranon process 31.5  10 cm proximal to ulnar styloid process 24  Just proximal to ulnar styloid process 18.5  Across hand at thumb web space 20.2  At base of 2nd digit 7  (Blank rows = not tested)  LANDMARK LEFT  eval  10 cm proximal to olecranon process 38  Olecranon process 31.5  10 cm proximal to ulnar styloid process 25  Just proximal to ulnar styloid process 19  Across hand at thumb web space 20  At base of 2nd digit 6.8  (Blank rows = not tested)   QUICK DASH SURVEY:       TODAY'S TREATMENT   09/23/2022 Therapeutic Exercises Pulleys into flexion and abduction x2 mins each  Roll yellow ball up wall into flexion x10, then Lt abduction x10 each  Manual  Therapy MLD: In Supine: Short neck, 5 diaphragmatic breaths, Lt inguinal and Rt axillary nodes, Lt axillo-inguinal and anterior inter-axillary anastomosis, then focused on Lt inferior breast, then retraced all steps. Spent extra time in inferior breast where there was increased fibrosis that softened with treatment today. MFR to cording extending from lumpectomy scar at axilla through lateral breast; this was much softer today PROM to L shoulder into flexion, abduction and D2 with  full P/ROM  09/09/22: Therapeutic Exercises Pulleys into flexion and abduction x2 mins each  Roll yellow ball up wall into flexion x10, then Lt abduction x10 each  Manual Therapy MLD: In Supine: Short neck, 5 diaphragmatic breaths, Lt inguinal and Rt axillary nodes, Lt axillo-inguinal and anterior inter-axillary anastomosis, then focused on Lt inferior breast, instructed pt throughout and issued handout and had pt return demonstrate entire sequence while providing verbal and tactile cues MFR to cording extending from lumpectomy scar at axilla through lateral breast; this was much softer today PROM to L shoulder into flexion, abduction and D2 with full P/ROM  09/07/22: Therapeutic Exercises Pulleys into flexion and abduction x2 mins each  Roll yellow ball up wall into flexion x10, then Lt abduction x10 each Modified Downward Dog on wall 5x, 5 sec holds Rt S/L over pink bolster with Lt UE OH reach for Lt intercostal/lateral trunk stretch 5x holding stretch and breathing in to stretch Manual Therapy MLD: In Supine: Short neck, 5 diaphragmatic breaths, Lt inguinal and Rt axillary nodes, Lt axillo-inguinal and anterior inter-axillary anastomosis, then focused on Lt inferior breast, initially she had increased fibrosis which improved with stretching MFR to cording extending from lumpectomy scar at axilla; this was much softer today PROM to L shoulder into flexion, abduction and D2 with full P/ROM  09/02/22: Therapeutic  Exercises Pulleys into flexion and abduction x2 mins each  Roll yellow ball up wall into flexion x10, then Lt abduction x10 each Modified Downward Dog on wall 5x, 5 sec holds Rt S/L over pink bolster with Lt UE OH reach for Lt intercostal/lateral trunk stretch 5x, 5 sec holds with pt reporting still feeling good stretch with this Manual Therapy MLD: In Supine: Short neck, 5 diaphragmatic breaths, Lt inguinal and Rt axillary nodes, Lt axillo-inguinal and anterior inter-axillary anastomosis, then focused on Lt inferior breast, this area mch improved  MFR to cording extending from lumpectomy scar at axilla; this was much softer today PROM to L shoulder into flexion, abduction and D2 with full P/ROM  08/31/22: Therapeutic Exercises Pulleys into flexion and abduction x2 mins each  Roll yellow ball up wall into flexion x10, then Lt abduction x10 each Modified Downward Dog on wall 5x, 5 sec holds Rt S/L over pink bolster with Lt UE OH reach for Lt intercostal/lateral trunk stretch 5x, 5 sec holds returning therapist demo, pt also felt stretch in area of cording Manual Therapy MLD: In Supine: Short neck, 5 diaphragmatic breaths, Lt inguinal and Rt axillary nodes, Lt axillo-inguinal and anterior inter-axillary anastomosis, then focused on Lt inferior breast where some fibrosis palpable along with scar tissue but this felt much improved today and peau d'orange seemed less as well MFR to cording extending from lumpectomy scar at axilla PROM to L shoulder into flexion, abduction and D2 with full P/ROM STM: Scar tissue mobs along incision inferior to Lt breast with pt in Rt S/L  08/26/22: Therapeutic Exercises Doorway Pectoralis Stretch in varying UE positions for different stretch 4-5x total x20 sec each Pulleys into flexion and abduction x2 mins each with VCs to decrease stretch Roll yellow ball up wall into flexion x10, then Lt abduction x10 each Supine low trunk rotation with bil UE's in horz abd for  trunk stretch at incisions Manual Therapy MLD: In Supine: Short neck, 5 diaphragmatic breaths, Lt inguinal and Rt axillary nodes, Lt axillo-inguinal and anterior inter-axillary anastomosis, then focused on Lt inferior breast where some fibrosis palpable along with scar tissue MFR to cording extending  from lumpectomy scar through L lateral breast and in to axilla; also with LTR a few times for MFR to anterior trunk PROM to L shoulder into flexion, abduction and D2 STM: Scar tissue mobs along incision inferior to Lt breast   PATIENT EDUCATION:  Education details: cording and importance of stretching to reduce cording, L breast lymphedema vs. Post op radiation edema, vs edema from cording Person educated: Patient Education method: Explanation Education comprehension: verbalized understanding   HOME EXERCISE PROGRAM: Stretch at end range and massage cording  ASSESSMENT:  CLINICAL IMPRESSION: Pt is feeling less stiff this week and less tightness with passive stretching. She has been trying the self massage at home and it is going well. Patient would benefit from skilled therapy to continue work on stretching the cords and start introducing strengthening of her left arm.  OBJECTIVE IMPAIRMENTS decreased ROM, increased edema, increased fascial restrictions, impaired UE functional use, postural dysfunction, and pain.   ACTIVITY LIMITATIONS carrying, lifting, and reach over head  PARTICIPATION LIMITATIONS:  none  PERSONAL FACTORS  none  are also affecting patient's functional outcome.   REHAB POTENTIAL: Good  CLINICAL DECISION MAKING: Stable/uncomplicated  EVALUATION COMPLEXITY: Low  GOALS: Goals reviewed with patient? Yes  SHORT TERM GOALS=LONG TERM GOALS  Target date: 10/21/2022    Pt will be independent in self MLD for long term management of L breast edema.  Baseline: Goal status: ONGOING  2.  Pt will report a 75% improvement in pain in L breast, axilla and upper arm to  allow improved comfort.  Baseline:  Goal status: MET 09/09/22 75% improvement  3.  Pt will demonstrate 175 degrees of L shoulder abduction to allow her to reach out to the side.  Baseline: 125 Goal status: MET 09/09/22- 179  4.  Pt will be independent in a home exercise program for long term stretching and strengthening.  Baseline:  Goal status: IN PROGRESS    PLAN: PT FREQUENCY: 1x/week  PT DURATION: 4 weeks  PLANNED INTERVENTIONS: Therapeutic exercises, Therapeutic activity, Patient/Family education, Self Care, Joint mobilization, Manual lymph drainage, Compression bandaging, scar mobilization, Taping, Vasopneumatic device, and Manual therapy  PLAN FOR NEXT SESSION: Cont MLD to L breast, MFR to cording in L lateral breast and axilla, MLD to L axilla, PROM to L shoulder, pulleys, ball, start light strengthening    Trevonte Ashkar, Student-PT 09/23/2022  10:57 AM

## 2022-09-28 ENCOUNTER — Ambulatory Visit (INDEPENDENT_AMBULATORY_CARE_PROVIDER_SITE_OTHER): Payer: 59 | Admitting: Psychiatry

## 2022-09-28 DIAGNOSIS — F4310 Post-traumatic stress disorder, unspecified: Secondary | ICD-10-CM

## 2022-09-28 DIAGNOSIS — F3341 Major depressive disorder, recurrent, in partial remission: Secondary | ICD-10-CM
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
## 2022-09-28 NOTE — Plan of Care (Signed)
  Problem: PTSD-Trauma Disorder CCP avoidant behaviors (interacting with others) ,negative thoughts about self and others. Goal: STG: Maria Diaz WILL EXPERIENCE A 50% REDUCTION IN EXAGGERATED BELIEFS ABOUT SELF AND OTHERS THAT INTERFERE WITH TRAUMA RESOLUTION AS EVIDENCED BY SELF-REPORT Outcome: Progressing

## 2022-09-30 ENCOUNTER — Ambulatory Visit: Payer: 59

## 2022-09-30 DIAGNOSIS — Z483 Aftercare following surgery for neoplasm: Secondary | ICD-10-CM

## 2022-09-30 DIAGNOSIS — Z170 Estrogen receptor positive status [ER+]: Secondary | ICD-10-CM

## 2022-09-30 DIAGNOSIS — I890 Lymphedema, not elsewhere classified: Secondary | ICD-10-CM | POA: Diagnosis not present

## 2022-09-30 DIAGNOSIS — M25612 Stiffness of left shoulder, not elsewhere classified: Secondary | ICD-10-CM

## 2022-09-30 NOTE — Therapy (Signed)
OUTPATIENT PHYSICAL THERAPY ONCOLOGY TREATMENT  Patient Name: ARLYN BUMPUS MRN: [id number] DOB:[birth year], 51 y.o., female Today's Date: 09/30/2022   PT End of Session - 09/30/22 1009     Visit Number 12    Number of Visits 13    Date for PT Re-Evaluation 10/07/22    PT Start Time 1006   pt arrived late   PT Stop Time 1100    PT Time Calculation (min) 54 min    Activity Tolerance Patient tolerated treatment well    Behavior During Therapy [facility] for tasks assessed/performed              Past Medical History:  Diagnosis Date   Anemia    Anxiety    Depression    Glaucoma    History of radiation therapy    Left breast 05/28/22-07/06/22-Dr. Gery Pray   Hives    chronic, on Xolair injections   Past Surgical History:  Procedure Laterality Date   BREAST BIOPSY Left 01/19/2022   BREAST CYST EXCISION Left 01/28/2021   Procedure: LEFT BREAST MASS EXCISION;  Surgeon: Rolm Bookbinder, MD;  Location: [place];  Service: General;  Laterality: Left;  START TIME OF 3:00 PM FOR 60 MINUTES IN ROOM 8   BREAST LUMPECTOMY WITH RADIOACTIVE SEED AND SENTINEL LYMPH NODE BIOPSY Left 04/13/2022   Procedure: LEFT BREAST BRACKETED LUMPECTOMY WITH RADIOACTIVE SEED AND AXILLARY SENTINEL LYMPH NODE BIOPSY;  Surgeon: Rolm Bookbinder, MD;  Location: [place];  Service: General;  Laterality: Left;   HYSTEROSCOPY W/ ENDOMETRIAL ABLATION  08/14/2020   [facility]   MASTOPEXY Bilateral 04/21/2022   Procedure: MASTOPEXY;  Surgeon: Irene Limbo, MD;  Location: [place];  Service: Plastics;  Laterality: Bilateral;   Patient Active Problem List   Diagnosis Date Noted   Anemia 12/31/2021   Malignant neoplasm of upper-inner quadrant of left breast in female, estrogen receptor positive ([place]) 12/31/2021   Open angle with borderline findings and low glaucoma risk in both eyes 01/11/2016   Chronic idiopathic urticaria 01/11/2016   Age-related nuclear  cataract of both eyes 01/11/2016   Eczema 01/11/2016    PCP: [name], MD  REFERRING PROVIDER: Gery Pray, MD  REFERRING DIAG: C50.212,Z17.0 (ICD-10-CM) - Malignant neoplasm of upper-inner quadrant of left breast in female, estrogen receptor positive ([place])  THERAPY DIAG:  Lymphedema, not elsewhere classified  Malignant neoplasm of upper-inner quadrant of left breast in female, estrogen receptor positive ([place])  Aftercare following surgery for neoplasm  Stiffness of left shoulder, not elsewhere classified  ONSET DATE: 12/09/21  Rationale for Evaluation and Treatment Rehabilitation  SUBJECTIVE  SUBJECTIVE STATEMENT: I'm sorry I'm a few mins late but I locked myself out of my house! I was able to get back in through a window. I had to use my arm pulling into the window but thankfully it seems fine.    PERTINENT HISTORY:  Patient was diagnosed on 12/09/21 with left grade 3. It measures 3 cm and is located in the upper inner quadrant. 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 04/13/22- underwent L breast lumpectomy and SLNB (0/3) 04/21/22- bilateral mastopexy  PAIN:  Are you having pain? No, just really tight  PRECAUTIONS: Other: at risk of lymphedema  WEIGHT BEARING RESTRICTIONS No  FALLS:  Has patient fallen in last 6 months? 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  LEISURE: pt has been doing PT exercises 2x/wk and on the other days pt walks  HAND DOMINANCE : right   PRIOR LEVEL OF FUNCTION: Independent  PATIENT GOALS to avoid lymphedema, decrease  pain   OBJECTIVE  COGNITION:  Overall cognitive status: Within functional limits for tasks assessed   PALPATION: 1 cord palpable in left lateral breast, fibrosis in L axilla  OBSERVATIONS / OTHER ASSESSMENTS: fullness in L breast, fullness and fibrosis in L axilla, 1 cord extending from scar in L lateral breast extending to axilla  POSTURE: forward head, rounded shoulders  UPPER EXTREMITY AROM/PROM:  A/PROM RIGHT   eval   Shoulder extension 62  Shoulder flexion 155  Shoulder abduction 177  Shoulder internal rotation 70  Shoulder external rotation 88    (Blank rows = not tested)  A/PROM LEFT   eval LEFT 08/19/22 08/26/22 09/02/22  Shoulder extension 57 65    Shoulder flexion 160 with p! 175 160 with pull/tingle 165 with pull only  Shoulder abduction 125 172 173 174  Shoulder internal rotation 60 60    Shoulder external rotation 90 85      (Blank rows = not tested)    LYMPHEDEMA ASSESSMENTS:   SURGERY TYPE/DATE: 04/13/22 L lumpectomy and SLNB  NUMBER OF LYMPH NODES REMOVED: 0/3  CHEMOTHERAPY: Completed  RADIATION:completed  HORMONE TREATMENT: tamoxifen - currently taking  INFECTIONS: none  LYMPHEDEMA ASSESSMENTS:   LANDMARK RIGHT  eval  10 cm proximal to olecranon process 38  Olecranon process 31.5  10 cm proximal to ulnar styloid process 24  Just proximal to ulnar styloid process 18.5  Across hand at thumb web space 20.2  At base of 2nd digit 7  (Blank rows = not tested)  LANDMARK LEFT  eval  10 cm proximal to olecranon process 38  Olecranon process 31.5  10 cm proximal to ulnar styloid process 25  Just proximal to ulnar styloid process 19  Across hand at thumb web space 20  At base of 2nd digit 6.8  (Blank rows = not tested)   QUICK DASH SURVEY:       TODAY'S TREATMENT   09/30/22  Therapeutic Exercises Pulleys into flexion and abduction x2 mins each  Roll yellow ball up wall with 1# added to Lt wrist into flexion x10, then Lt abduction  x10 each Standing with back against wall/core engaged, and head/shoulders against wall for erect posture for bil UE 3 way raises, with 2#, x10 each returning therapist demo  Manual Therapy MLD: In Supine: Short neck, superficial and deep aadominals,  Lt inguinal and Rt axillary nodes along with Rt intact upper quadrant sequence, Lt axillo-inguinal and anterior inter-axillary anastomosis, then focused on Lt inferior breast, then retraced all steps. Spent extra time at inferior breast in Rt S/L where there was increased fibrosis that continued to soften with treatment today.  MFR to cording extending from lumpectomy scar at axilla through Lt lateral breast PROM to L shoulder into flexion, abduction and D2 with full P/ROM   09/23/2022 Therapeutic Exercises Pulleys into flexion and abduction x2 mins each  Roll yellow ball up wall into flexion x10, then Lt abduction x10 each  Manual Therapy MLD: In Supine: Short neck, 5 diaphragmatic breaths, Lt inguinal and Rt axillary nodes, Lt axillo-inguinal and anterior inter-axillary anastomosis, then focused on Lt inferior breast, then retraced all steps. Spent extra time in inferior breast where there was increased fibrosis that softened with treatment today. MFR to cording extending from lumpectomy scar at axilla through lateral breast; this was much softer today PROM to L shoulder into flexion, abduction and D2 with full P/ROM  09/09/22: Therapeutic Exercises Pulleys into flexion and abduction x2 mins each  Roll yellow ball up wall into flexion x10, then Lt abduction x10 each  Manual Therapy MLD: In Supine: Short neck, 5 diaphragmatic breaths, Lt inguinal and Rt axillary nodes, Lt axillo-inguinal and anterior inter-axillary anastomosis, then focused on Lt inferior breast, instructed pt throughout and issued handout and had pt return demonstrate entire sequence while providing verbal and tactile cues MFR to cording extending from lumpectomy scar at axilla  through lateral breast; this was much softer today PROM to L shoulder into flexion, abduction and D2 with full P/ROM    PATIENT EDUCATION:  Education details: cording and importance of stretching to reduce cording, L breast lymphedema vs. Post op radiation edema, vs edema from cording Person educated: Patient Education method: Explanation Education comprehension: verbalized understanding   HOME EXERCISE PROGRAM: Stretch at end range and massage cording  ASSESSMENT:  CLINICAL IMPRESSION: Much improvement noted since this therapist saw pt last. Her fibrosis in her Lt breast is much improved and cording much resolved. Pt reports noting same since ending radiation. Resumed gentle strengthening to include bil UE 3 way raises which she tolerated well. Also encouraged her to resume supine scapular series at home starting with 5 reps at a time only every other day initially. Pt verbalized good understanding.   OBJECTIVE IMPAIRMENTS decreased ROM, increased edema, increased fascial restrictions, impaired UE functional use, postural dysfunction, and pain.   ACTIVITY LIMITATIONS carrying, lifting, and reach over head  PARTICIPATION LIMITATIONS:  none  PERSONAL FACTORS  none  are also affecting patient's functional outcome.   REHAB POTENTIAL: Good  CLINICAL DECISION MAKING: Stable/uncomplicated  EVALUATION COMPLEXITY: Low  GOALS: Goals reviewed with patient? Yes  SHORT TERM GOALS=LONG TERM GOALS  Target date: 10/28/2022    Pt will be independent in self MLD for long term management of L breast edema.  Baseline: Goal status: ONGOING  2.  Pt will report a 75% improvement in pain in L breast, axilla and upper arm to allow improved comfort.  Baseline:  Goal status: MET 09/09/22 75% improvement  3.  Pt will demonstrate 175 degrees of L shoulder abduction to allow her to reach out to the side.  Baseline: 125 Goal status: MET 09/09/22- 179  4.  Pt will be independent in a home  exercise program for long term stretching and strengthening.  Baseline:  Goal status: IN PROGRESS    PLAN: PT  FREQUENCY: 1x/week  PT DURATION: 4 weeks  PLANNED INTERVENTIONS: Therapeutic exercises, Therapeutic activity, Patient/Family education, Self Care, Joint mobilization, Manual lymph drainage, Compression bandaging, scar mobilization, Taping, Vasopneumatic device, and Manual therapy  PLAN FOR NEXT SESSION: Probable D/C next. Cont MLD to L breast, MFR to cording in L lateral breast and axilla, MLD to L axilla, PROM to L shoulder, pulleys, ball, review and progress any HEP in prep for D/C.   Collie Siad, PTA 09/30/22 11:01 AM

## 2022-10-05 ENCOUNTER — Ambulatory Visit: Payer: 59

## 2022-10-07 ENCOUNTER — Ambulatory Visit: Payer: 59

## 2022-10-07 VITALS — Wt 216.1 lb

## 2022-10-07 DIAGNOSIS — I890 Lymphedema, not elsewhere classified: Secondary | ICD-10-CM

## 2022-10-07 DIAGNOSIS — Z483 Aftercare following surgery for neoplasm: Secondary | ICD-10-CM

## 2022-10-07 DIAGNOSIS — M25612 Stiffness of left shoulder, not elsewhere classified: Secondary | ICD-10-CM

## 2022-10-07 DIAGNOSIS — Z170 Estrogen receptor positive status [ER+]: Secondary | ICD-10-CM

## 2022-10-07 NOTE — Therapy (Addendum)
OUTPATIENT PHYSICAL THERAPY ONCOLOGY TREATMENT  Patient Name: Maria Diaz MRN: [id number] DOB:1970-12-26, 51 y.o., female Today's Date: 10/07/2022   PT End of Session - 10/07/22 1006     Visit Number 13    Number of Visits 13    Date for PT Re-Evaluation 10/07/22    PT Start Time 1003    PT Stop Time 1100    PT Time Calculation (min) 57 min    Activity Tolerance Patient tolerated treatment well    Behavior During Therapy [facility] for tasks assessed/performed              Past Medical History:  Diagnosis Date   Anemia    Anxiety    Depression    Glaucoma    History of radiation therapy    Left breast 05/28/22-07/06/22-Dr. Gery Pray   Hives    chronic, on Xolair injections   Past Surgical History:  Procedure Laterality Date   BREAST BIOPSY Left 01/19/2022   BREAST CYST EXCISION Left 01/28/2021   Procedure: LEFT BREAST MASS EXCISION;  Surgeon: Rolm Bookbinder, MD;  Location: [place];  Service: General;  Laterality: Left;  START TIME OF 3:00 PM FOR 60 MINUTES IN ROOM 8   BREAST LUMPECTOMY WITH RADIOACTIVE SEED AND SENTINEL LYMPH NODE BIOPSY Left 04/13/2022   Procedure: LEFT BREAST BRACKETED LUMPECTOMY WITH RADIOACTIVE SEED AND AXILLARY SENTINEL LYMPH NODE BIOPSY;  Surgeon: Rolm Bookbinder, MD;  Location: [place];  Service: General;  Laterality: Left;   HYSTEROSCOPY W/ ENDOMETRIAL ABLATION  08/14/2020   [facility]   MASTOPEXY Bilateral 04/21/2022   Procedure: MASTOPEXY;  Surgeon: Irene Limbo, MD;  Location: [place];  Service: Plastics;  Laterality: Bilateral;   Patient Active Problem List   Diagnosis Date Noted   Anemia 12/31/2021   Malignant neoplasm of upper-inner quadrant of left breast in female, estrogen receptor positive ([place]) 12/31/2021   Open angle with borderline findings and low glaucoma risk in both eyes 01/11/2016   Chronic idiopathic urticaria 01/11/2016   Age-related nuclear cataract of both eyes  01/11/2016   Eczema 01/11/2016    PCP: [name], MD  REFERRING PROVIDER: Gery Pray, MD  REFERRING DIAG: C50.212,Z17.0 (ICD-10-CM) - Malignant neoplasm of upper-inner quadrant of left breast in female, estrogen receptor positive ([place])  THERAPY DIAG:  Lymphedema, not elsewhere classified  Malignant neoplasm of upper-inner quadrant of left breast in female, estrogen receptor positive ([place])  Aftercare following surgery for neoplasm  Stiffness of left shoulder, not elsewhere classified  ONSET DATE: 12/09/21  Rationale for Evaluation and Treatment Rehabilitation  SUBJECTIVE  SUBJECTIVE STATEMENT: I'm doing well and ready for D/C.   PERTINENT HISTORY:  Patient was diagnosed on 12/09/21 with left grade 3. It measures 3 cm and is located in the upper inner quadrant. 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 04/13/22- underwent L breast lumpectomy and SLNB (0/3) 04/21/22- bilateral mastopexy  PAIN:  Are you having pain? No, just really tight  PRECAUTIONS: Other: at risk of lymphedema  WEIGHT BEARING RESTRICTIONS No  FALLS:  Has patient fallen in last 6 months? 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  LEISURE: pt has been doing PT exercises 2x/wk and on the other days pt walks  HAND DOMINANCE : right   PRIOR LEVEL OF FUNCTION: Independent  PATIENT GOALS to avoid lymphedema, decrease pain   OBJECTIVE  COGNITION:  Overall cognitive status: Within functional limits for tasks assessed   PALPATION: 1 cord palpable in left lateral breast, fibrosis in L axilla  OBSERVATIONS / OTHER  ASSESSMENTS: fullness in L breast, fullness and fibrosis in L axilla, 1 cord extending from scar in L lateral breast extending to axilla  POSTURE: forward head, rounded shoulders  UPPER EXTREMITY AROM/PROM:  A/PROM RIGHT   eval   Shoulder extension 62  Shoulder flexion 155  Shoulder abduction 177  Shoulder internal rotation 70  Shoulder external rotation 88    (Blank rows = not tested)  A/PROM LEFT   eval LEFT 08/19/22 08/26/22 09/02/22  Shoulder extension 57 65    Shoulder flexion 160 with p! 175 160 with pull/tingle 165 with pull only  Shoulder abduction 125 172 173 174  Shoulder internal rotation 60 60    Shoulder external rotation 90 85      (Blank rows = not tested)    LYMPHEDEMA ASSESSMENTS:   SURGERY TYPE/DATE: 04/13/22 L lumpectomy and SLNB  NUMBER OF LYMPH NODES REMOVED: 0/3  CHEMOTHERAPY: Completed  RADIATION:completed  HORMONE TREATMENT: tamoxifen - currently taking  INFECTIONS: none  LYMPHEDEMA ASSESSMENTS:   LANDMARK RIGHT  eval  10 cm proximal to olecranon process 38  Olecranon process 31.5  10 cm proximal to ulnar styloid process 24  Just proximal to ulnar styloid process 18.5  Across hand at thumb web space 20.2  At base of 2nd digit 7  (Blank rows = not tested)  LANDMARK LEFT  eval  10 cm proximal to olecranon process 38  Olecranon process 31.5  10 cm proximal to ulnar styloid process 25  Just proximal to ulnar styloid process 19  Across hand at thumb web space 20  At base of 2nd digit 6.8  (Blank rows = not tested)  L-DEX FLOWSHEETS - 10/07/22 1000       L-DEX LYMPHEDEMA SCREENING   Measurement Type Unilateral    L-DEX MEASUREMENT EXTREMITY Upper Extremity    POSITION  Standing    DOMINANT SIDE Right    At Risk Side Left    BASELINE SCORE (UNILATERAL) 1.3    L-DEX SCORE (UNILATERAL) 4.7    VALUE CHANGE (UNILAT) 3.4              QUICK DASH SURVEY:       TODAY'S TREATMENT   10/07/22  Therapeutic Exercises Pulleys  into flexion and abduction x2 mins each  Roll yellow ball up wall into flexion x10, then Lt abduction x10  each Standing with back against wall/core engaged, and head/shoulders against wall for erect posture for bil UE 3 way raises, with 2#, x10 each  Manual Therapy MLD: In Supine: Short neck, superficial and deep abdominals, Lt inguinal and Rt axillary nodes along with Rt intact upper quadrant sequence, Lt axillo-inguinal and anterior inter-axillary anastomosis, then focused on Lt inferior breast, then retraced all steps. Spent extra time at inferior breast in Rt S/L where there was increased fibrosis that continued to soften with treatment today.  MFR to cording extending from lumpectomy scar at axilla through Lt lateral breast PROM to L shoulder into flexion, abduction and D2 with full P/ROM   09/30/22  Therapeutic Exercises Pulleys into flexion and abduction x2 mins each  Roll yellow ball up wall with 1# added to Lt wrist into flexion x10, then Lt abduction x10 each Standing with back against wall/core engaged, and head/shoulders against wall for erect posture for bil UE 3 way raises, with 2#, x10 each returning therapist demo  Manual Therapy MLD: In Supine: Short neck, superficial and deep aadominals, Lt inguinal and Rt axillary nodes along with Rt intact upper quadrant sequence, Lt axillo-inguinal and anterior inter-axillary anastomosis, then focused on Lt inferior breast, then retraced all steps. Spent extra time at inferior breast in Rt S/L where there was increased fibrosis that continued to soften with treatment today.  MFR to cording extending from lumpectomy scar at axilla through Lt lateral breast PROM to L shoulder into flexion, abduction and D2 with full P/ROM   09/23/2022 Therapeutic Exercises Pulleys into flexion and abduction x2 mins each  Roll yellow ball up wall into flexion x10, then Lt abduction x10 each  Manual Therapy MLD: In Supine: Short neck, 5 diaphragmatic breaths, Lt  inguinal and Rt axillary nodes, Lt axillo-inguinal and anterior inter-axillary anastomosis, then focused on Lt inferior breast, then retraced all steps. Spent extra time in inferior breast where there was increased fibrosis that softened with treatment today. MFR to cording extending from lumpectomy scar at axilla through lateral breast; this was much softer today PROM to L shoulder into flexion, abduction and D2 with full P/ROM     L-DEX FLOWSHEETS - 10/07/22 1000       L-DEX LYMPHEDEMA SCREENING   Measurement Type Unilateral    L-DEX MEASUREMENT EXTREMITY Upper Extremity    POSITION  Standing    DOMINANT SIDE Right    At Risk Side Left    BASELINE SCORE (UNILATERAL) 1.3    L-DEX SCORE (UNILATERAL) 4.7    VALUE CHANGE (UNILAT) 3.4              PATIENT EDUCATION:  Education details: cording and importance of stretching to reduce cording, L breast lymphedema vs. Post op radiation edema, vs edema from cording Person educated: Patient Education method: Explanation Education comprehension: verbalized understanding   HOME EXERCISE PROGRAM: Stretch at end range and massage cording, supine scapular series, bil UE 3 way raises   ASSESSMENT:  CLINICAL IMPRESSION: Pt has done excellent this episode of therapy and has met all goals. She is ready for D/C at this time. She will cont every 3 month [facility] screens.   OBJECTIVE IMPAIRMENTS decreased ROM, increased edema, increased fascial restrictions, impaired UE functional use, postural dysfunction, and pain.   ACTIVITY LIMITATIONS carrying, lifting, and reach over head  PARTICIPATION LIMITATIONS:  none  PERSONAL FACTORS  none  are also affecting patient's functional outcome.   REHAB POTENTIAL: Good  CLINICAL DECISION MAKING: Stable/uncomplicated  EVALUATION COMPLEXITY: Low  GOALS: Goals reviewed with patient? Yes  SHORT TERM GOALS=LONG TERM GOALS  Target date: 11/04/2022    Pt will be independent in self MLD for long term  management of L breast edema.  Baseline:  Pt is independent with this - 10/07/22 Goal status: MET  2.  Pt will report a 75% improvement in pain in L breast, axilla and upper arm to allow improved comfort.  Baseline:  Goal status: MET 09/09/22 75% improvement; 90% - 10/07/22  3.  Pt will demonstrate 175 degrees of L shoulder abduction to allow her to reach out to the side.  Baseline: 125 Goal status: MET 09/09/22- 179  4.  Pt will be independent in a home exercise program for long term stretching and strengthening.  Baseline:  Goal status: MET    PLAN: PT FREQUENCY: 1x/week  PT DURATION: 4 weeks  PLANNED INTERVENTIONS: Therapeutic exercises, Therapeutic activity, Patient/Family education, Self Care, Joint mobilization, Manual lymph drainage, Compression bandaging, scar mobilization, Taping, Vasopneumatic device, and Manual therapy  PLAN FOR NEXT SESSION: D/C this visit; Will cont every 3 month [facility] screens  Collie Siad, PTA 10/07/22 11:02 AM   3 Way Raises:      Starting Position:  Leaning against wall, walk feet a few inches away from the wall and make tummy tight (tuck hips underneath you) Press back/shoulders/head against wall as much as possible. Keep thumbs up to ceiling, elbows straight and shoulders relaxed/down throughout.  1. Lift arms in front to shoulder height 2. Lift arms a little wider into a "V" to shoulder height 3. Lift arms out to sides in a "T" to shoulder height  Perform 10 times in each direction. Hold 1-2 lbs to start with and work up to 2-3 sets of 10/day. Perform 3-4 times/week. Increase weight as able, decreasing sets of 10 each time you increase weights, then slowly working your way back up to 2-3 sets each time.    Cancer Rehab [phone]  PHYSICAL THERAPY DISCHARGE SUMMARY  Visits from Start of Care: 13  Current functional level related to goals / functional outcomes: All goals met   Remaining deficits: None   Education /  Equipment: HEP, MLD   Patient agrees to discharge. Patient goals were met. Patient is being discharged due to meeting the stated rehab goals.  Allyson Sabal Trainer, [place] 10/07/22 2:00 PM

## 2022-10-07 NOTE — Patient Instructions (Signed)
3 Way Raises:      Starting Position:  Leaning against wall, walk feet a few inches away from the wall and make tummy tight (tuck hips underneath you) Press back/shoulders/head against wall as much as possible. Keep thumbs up to ceiling, elbows straight and shoulders relaxed/down throughout.  1. Lift arms in front to shoulder height 2. Lift arms a little wider into a "V" to shoulder height 3. Lift arms out to sides in a "T" to shoulder height  Perform 10 times in each direction. Hold 1-2 lbs to start with and work up to 2-3 sets of 10/day. Perform 3-4 times/week. Increase weight as able, decreasing sets of 10 each time you increase weights, then slowly working your way back up to 2-3 sets each time.    Cancer Rehab [phone]

## 2022-10-12 ENCOUNTER — Ambulatory Visit (HOSPITAL_COMMUNITY): Payer: 59 | Admitting: Psychiatry

## 2022-10-12 ENCOUNTER — Encounter (HOSPITAL_COMMUNITY): Payer: Self-pay

## 2022-10-20 ENCOUNTER — Inpatient Hospital Stay: Payer: 59 | Attending: Adult Health | Admitting: Adult Health

## 2022-10-20 ENCOUNTER — Encounter: Payer: Self-pay | Admitting: Adult Health

## 2022-10-20 VITALS — BP 114/67 | HR 75 | Temp 98.3°F | Resp 18 | Wt 216.6 lb

## 2022-10-20 DIAGNOSIS — Z170 Estrogen receptor positive status [ER+]: Secondary | ICD-10-CM | POA: Diagnosis not present

## 2022-10-20 DIAGNOSIS — Z79810 Long term (current) use of selective estrogen receptor modulators (SERMs): Secondary | ICD-10-CM | POA: Insufficient documentation

## 2022-10-20 DIAGNOSIS — C50212 Malignant neoplasm of upper-inner quadrant of left female breast: Secondary | ICD-10-CM | POA: Diagnosis present

## 2022-10-20 NOTE — Progress Notes (Signed)
SURVIVORSHIP VISIT:   BRIEF ONCOLOGIC HISTORY:  Oncology History  Malignant neoplasm of upper-inner quadrant of left breast in female, estrogen receptor positive ([place])  12/31/2021 Initial Diagnosis   Malignant neoplasm of upper-inner quadrant of left breast in female, estrogen receptor positive ([place])   01/08/2022 -  Chemotherapy   Completed 4 cycles of neoadjuvant TC, last cycle received on March 12, 2022     04/21/2022 Definitive Surgery   She had left breast lumpectomy with no residual carcinoma status post neoadjuvant therapy, treatment related changes, no carcinoma identified in lymph nodes.  Final pathologic staging PT0N0 she also underwent bilateral mastopexy, benign breast with no evidence of carcinoma   05/28/2022 - 07/06/2022 Radiation Therapy   Site Technique Total Dose (Gy) Dose per Fx (Gy) Completed Fx Beam Energies  Breast, Left: Breast_L 3D 50.4/50.4 1.8 28/28 10XFFF     06/2022 -  Anti-estrogen oral therapy   Tamoxifen     INTERVAL HISTORY:  Maria Diaz to review her survivorship care plan detailing her treatment course for breast cancer, as well as monitoring long-term side effects of that treatment, education regarding health maintenance, screening, and overall wellness and health promotion.     Overall, Maria Diaz reports feeling quite well. She is taking Tamoxifen daily.  Her biggest issue is night sweats.  She typically wakes up about once per night.  She experiences 7/10 fatigue and daytime sleepiness.  She goes to sleep around 11pm and wakes up around 4-5 am and will fall back asleep around 6 am and wake back up around 730 am.    She takes trazadone for sleep prescribed by Dr. Modesta Messing who works with [employer] at [facility] in [place]. She has h/o depression and sees Dr. Malvin Johns regularly.  She did not notice an increase in this with the tamoxifen.  She was experiencing increased menstrual flow and was started on Provera about 2 years ago.    REVIEW OF SYSTEMS:   Review of Systems  Constitutional:  Negative for appetite change, chills, fatigue, fever and unexpected weight change.  HENT:   Negative for hearing loss, lump/mass and trouble swallowing.   Eyes:  Negative for eye problems and icterus.  Respiratory:  Negative for chest tightness, cough and shortness of breath.   Cardiovascular:  Negative for chest pain, leg swelling and palpitations.  Gastrointestinal:  Negative for abdominal distention, abdominal pain, constipation, diarrhea, nausea and vomiting.  Endocrine: Negative for hot flashes.  Genitourinary:  Negative for difficulty urinating.   Musculoskeletal:  Negative for arthralgias.  Skin:  Negative for itching and rash.  Neurological:  Negative for dizziness, extremity weakness, headaches and numbness.  Hematological:  Negative for adenopathy. Does not bruise/bleed easily.  Psychiatric/Behavioral:  Negative for depression. The patient is not nervous/anxious.   Breast: Denies any new nodularity, masses, tenderness, nipple changes, or nipple discharge.      ONCOLOGY TREATMENT TEAM:  1. Surgeon:  Dr. Donne Hazel at [facility] 2. Medical Oncologist: Dr. Sondra Come  3. Radiation Oncologist: Dr. Chryl Heck    PAST MEDICAL/SURGICAL HISTORY:  Past Medical History:  Diagnosis Date   Anemia    Anxiety    Depression    Glaucoma    History of radiation therapy    Left breast 05/28/22-07/06/22-Dr. Gery Pray   Hives    chronic, on Xolair injections   Past Surgical History:  Procedure Laterality Date   BREAST BIOPSY Left 01/19/2022   BREAST CYST EXCISION Left 01/28/2021   Procedure: LEFT BREAST MASS EXCISION;  Surgeon:  Rolm Bookbinder, MD;  Location: [place];  Service: General;  Laterality: Left;  START TIME OF 3:00 PM FOR 60 MINUTES IN ROOM 8   BREAST LUMPECTOMY WITH RADIOACTIVE SEED AND SENTINEL LYMPH NODE BIOPSY Left 04/13/2022   Procedure: LEFT BREAST BRACKETED LUMPECTOMY WITH RADIOACTIVE SEED AND AXILLARY  SENTINEL LYMPH NODE BIOPSY;  Surgeon: Rolm Bookbinder, MD;  Location: [place];  Service: General;  Laterality: Left;   HYSTEROSCOPY W/ ENDOMETRIAL ABLATION  08/14/2020   [facility]   MASTOPEXY Bilateral 04/21/2022   Procedure: MASTOPEXY;  Surgeon: Irene Limbo, MD;  Location: [place];  Service: Plastics;  Laterality: Bilateral;     ALLERGIES:  Allergies  Allergen Reactions   Haemophilus Influenzae Vaccines Hives   Penicillins Hives     CURRENT MEDICATIONS:  Outpatient Encounter Medications as of 10/20/2022  Medication Sig   atorvastatin (LIPITOR) 10 MG tablet Take 10 mg by mouth daily.   fluticasone (FLONASE) 50 MCG/ACT nasal spray Place 2 sprays into the nose daily as needed.   hydrOXYzine (ATARAX) 25 MG tablet Take 1 tablet (25 mg total) by mouth daily as needed for anxiety. for anxiety   medroxyPROGESTERone (PROVERA) 10 MG tablet Take 10 mg by mouth daily.   omalizumab (XOLAIR) 150 MG injection Inject 300 mg into the skin every 6 (six) weeks. Dues sept 2   prazosin (MINIPRESS) 2 MG capsule Take 1 capsule (2 mg total) by mouth at bedtime.   sertraline (ZOLOFT) 100 MG tablet Take 2 tablets (200 mg total) by mouth daily.   tamoxifen (NOLVADEX) 20 MG tablet Take 1 tablet (20 mg total) by mouth daily.   traZODone (DESYREL) 50 MG tablet Take 0.5-1 tablets (25-50 mg total) by mouth at bedtime.   [DISCONTINUED] traMADol (ULTRAM) 50 MG tablet Take 1 tablet (50 mg total) by mouth every 6 (six) hours as needed for moderate pain.   EPINEPHrine 0.3 mg/0.3 mL IJ SOAJ injection Inject 0.3 mg into the muscle as needed. For severe allergy (Patient not taking: Reported on 10/20/2022)   [DISCONTINUED] oxyCODONE (ROXICODONE) 5 MG immediate release tablet Take 1 tablet (5 mg total) by mouth every 4 (four) hours as needed. (Patient not taking: Reported on 08/06/2022)   No facility-administered encounter medications on file as of 10/20/2022.     ONCOLOGIC FAMILY  HISTORY:  Family History  Problem Relation Age of Onset   Anxiety disorder Sister    Drug abuse Mother     SOCIAL HISTORY:  Social History   Socioeconomic History   Marital status: Divorced    Spouse name: Not on file   Number of children: Not on file   Years of education: Not on file   Highest education level: Not on file  Occupational History   Not on file  Tobacco Use   Smoking status: Never   Smokeless tobacco: Never  Substance and Sexual Activity   Alcohol use: Yes    Alcohol/week: 1.0 standard drink of alcohol    Types: 1 Glasses of wine per week    Comment: holidays   Drug use: Never   Sexual activity: Yes    Birth control/protection: None    Comment: ablation  Other Topics Concern   Not on file  Social History Narrative   Not on file   Social Determinants of Health   Financial Resource Strain: Not on file  Food Insecurity: Not on file  Transportation Needs: Not on file  Physical Activity: Not on file  Stress: Not on file  Social Connections: Not on file  Intimate Partner Violence: Not on file     OBSERVATIONS/OBJECTIVE:  BP 114/67 (BP Location: Right Arm, Patient Position: Sitting)   Pulse 75   Temp 98.3 F (36.8 C)   Resp 18   Wt 216 lb 9.6 oz (98.2 kg)   SpO2 98%   BMI 33.92 kg/m  GENERAL: Patient is a well appearing female in no acute distress HEENT:  Sclerae anicteric.  Oropharynx clear and moist. No ulcerations or evidence of oropharyngeal candidiasis. Neck is supple.  NODES:  No cervical, supraclavicular, or axillary lymphadenopathy palpated.  BREAST EXAM: Left breast status post lumpectomy and radiation no sign of local recurrence right breast benign LUNGS:  Clear to auscultation bilaterally.  No wheezes or rhonchi. HEART:  Regular rate and rhythm. No murmur appreciated. ABDOMEN:  Soft, nontender.  Positive, normoactive bowel sounds. No organomegaly palpated. MSK:  No focal spinal tenderness to palpation. Full range of motion bilaterally  in the upper extremities. EXTREMITIES:  No peripheral edema.   SKIN:  Clear with no obvious rashes or skin changes. No nail dyscrasia. NEURO:  Nonfocal. Well oriented.  Appropriate affect.   LABORATORY DATA:  None for this visit.  DIAGNOSTIC IMAGING:  None for this visit.      ASSESSMENT AND PLAN:  Ms.. Diaz is a pleasant 51 y.o. female with Stage IIB left breast invasive ductal carcinoma, ER+/PR-/HER2-, diagnosed in 12/2021, treated with neoadjuvant chemotherapy, lumpectomy, adjuvant radiation therapy, and anti-estrogen therapy with Tamoxifen beginning in 06/2022.  She presents to the Survivorship Clinic for our initial meeting and routine follow-up post-completion of treatment for breast cancer.    1. Stage IIB left breast cancer:  Maria Diaz is continuing to recover from definitive treatment for breast cancer. She will follow-up with her medical oncologist, Dr. Chryl Heck in 12/2022 with history and physical exam per surveillance protocol.  She will continue her anti-estrogen therapy with Tamoxifen. Thus far, she is tolerating the Tamoxifen well, with minimal side effects--with the exception of hot flashes which she is managing moderately well.. Her mammogram is due 02/2023; orders placed today. Today, a comprehensive survivorship care plan and treatment summary was reviewed with the patient today detailing her breast cancer diagnosis, treatment course, potential late/long-term effects of treatment, appropriate follow-up care with recommendations for the future, and patient education resources.  A copy of this summary, along with a letter will be sent to the patient's primary care provider via mail/fax/In Basket message after today's visit.  She is taking progesterone and I touch base with Dr. Chryl Heck and I am awaiting a response on her preference regarding Maria Diaz continuing this medication..  2. Bone health: She was given education on specific activities to promote bone health.  3. Cancer screening:   Due to Maria Diaz's history and her age, she should receive screening for skin cancers, colon cancer, and gynecologic cancers.  The information and recommendations are listed on the patient's comprehensive care plan/treatment summary and were reviewed in detail with the patient.    4. Health maintenance and wellness promotion: Maria Diaz was encouraged to consume 5-7 servings of fruits and vegetables per day. We reviewed the "Nutrition Rainbow" handout.  She was also encouraged to engage in moderate to vigorous exercise for 30 minutes per day most days of the week. We discussed the [employer] fitness program, which is designed for cancer survivors to help them become more physically fit after cancer treatments.  She was instructed to limit her alcohol consumption and continue to  abstain from tobacco use.     5. Support services/counseling: It is not uncommon for this period of the patient's cancer care trajectory to be one of many emotions and stressors. She was given information regarding our available services and encouraged to contact me with any questions or for help enrolling in any of our support group/programs.    Follow up instructions:    -Return to cancer center in February 2024 for follow-up with Dr. Chryl Heck -Mammogram due 12/2022 -She is welcome to return back to the Survivorship Clinic at any time; no additional follow-up needed at this time.  -Consider referral back to survivorship as a long-term survivor for continued surveillance  The patient was provided an opportunity to ask questions and all were answered. The patient agreed with the plan and demonstrated an understanding of the instructions.   Total encounter time:55 minutes*in face-to-face visit time, chart review, lab review, care coordination, order entry, and documentation of the encounter time.    Wilber Bihari, NP 10/20/22 10:45 AM Medical Oncology and Hematology [facility] [place], [place] [address] Tel. [phone]    Fax. [phone]  *Total Encounter Time as defined by the Centers for Medicare and Medicaid Services includes, in addition to the face-to-face time of a patient visit (documented in the note above) non-face-to-face time: obtaining and reviewing outside history, ordering and reviewing medications, tests or procedures, care coordination (communications with other health care professionals or caregivers) and documentation in the medical record.
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
## 2022-11-11 ENCOUNTER — Encounter: Payer: Self-pay | Admitting: Psychiatry

## 2022-11-11 ENCOUNTER — Telehealth (INDEPENDENT_AMBULATORY_CARE_PROVIDER_SITE_OTHER): Payer: 59 | Admitting: Psychiatry

## 2022-11-11 DIAGNOSIS — F3341 Major depressive disorder, recurrent, in partial remission: Secondary | ICD-10-CM

## 2022-11-11 DIAGNOSIS — G4700 Insomnia, unspecified: Secondary | ICD-10-CM | POA: Diagnosis not present

## 2022-11-11 DIAGNOSIS — F4310 Post-traumatic stress disorder, unspecified: Secondary | ICD-10-CM

## 2022-11-11 MED ORDER — PRAZOSIN HCL 2 MG PO CAPS: 2.0000 mg | ORAL_CAPSULE | Freq: Every day | ORAL | 1 refills | Status: DC

## 2022-11-11 MED ORDER — SERTRALINE HCL 100 MG PO TABS: 200.0000 mg | ORAL_TABLET | Freq: Every day | ORAL | 1 refills | Status: DC

## 2022-11-11 MED ORDER — TRAZODONE HCL 50 MG PO TABS: 25.0000 mg | ORAL_TABLET | Freq: Every day | ORAL | 0 refills | Status: DC

## 2022-11-11 NOTE — Patient Instructions (Signed)
Continue sertraline 200 mg daily  Continue hydroxyzine 25 mg daily as needed for anxiety  Continue prazosin 2 mg at night Continue Trazodone 50-100 mg at night as needed for insomnia Next appointment- 2/21 at 10 30

## 2022-11-13 ENCOUNTER — Encounter: Payer: Self-pay | Admitting: Hematology and Oncology

## 2022-11-13 ENCOUNTER — Ambulatory Visit (INDEPENDENT_AMBULATORY_CARE_PROVIDER_SITE_OTHER): Payer: 59 | Admitting: Psychiatry

## 2022-11-13 DIAGNOSIS — F3341 Major depressive disorder, recurrent, in partial remission: Secondary | ICD-10-CM

## 2022-11-13 DIAGNOSIS — F4310 Post-traumatic stress disorder, unspecified: Secondary | ICD-10-CM | POA: Diagnosis not present
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
## 2022-11-27 ENCOUNTER — Ambulatory Visit (INDEPENDENT_AMBULATORY_CARE_PROVIDER_SITE_OTHER): Payer: 59 | Admitting: Psychiatry

## 2022-11-27 DIAGNOSIS — F4310 Post-traumatic stress disorder, unspecified: Secondary | ICD-10-CM | POA: Diagnosis not present

## 2022-11-27 DIAGNOSIS — F3341 Major depressive disorder, recurrent, in partial remission: Secondary | ICD-10-CM | POA: Diagnosis not present
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
## 2022-12-01 ENCOUNTER — Encounter: Payer: Self-pay | Admitting: Hematology and Oncology

## 2022-12-01 ENCOUNTER — Telehealth: Payer: Self-pay | Admitting: Adult Health

## 2022-12-01 NOTE — Telephone Encounter (Signed)
R/s appt time from 1445 to 1545 per provider request, called but went straight to VM, will try to call again

## 2022-12-03 ENCOUNTER — Inpatient Hospital Stay: Payer: 59 | Attending: Hematology and Oncology | Admitting: Adult Health

## 2022-12-03 ENCOUNTER — Telehealth: Payer: Self-pay | Admitting: *Deleted

## 2022-12-03 VITALS — BP 112/68 | HR 76 | Temp 97.4°F | Resp 18 | Ht 67.0 in | Wt 216.8 lb

## 2022-12-03 DIAGNOSIS — Z170 Estrogen receptor positive status [ER+]: Secondary | ICD-10-CM | POA: Diagnosis not present

## 2022-12-03 DIAGNOSIS — N63 Unspecified lump in unspecified breast: Secondary | ICD-10-CM | POA: Diagnosis not present

## 2022-12-03 DIAGNOSIS — Z79810 Long term (current) use of selective estrogen receptor modulators (SERMs): Secondary | ICD-10-CM | POA: Diagnosis not present

## 2022-12-03 DIAGNOSIS — C50212 Malignant neoplasm of upper-inner quadrant of left female breast: Secondary | ICD-10-CM

## 2022-12-03 NOTE — Telephone Encounter (Signed)
Orders faxed to [facility] for Mammogram and [place] Fax confirmation received

## 2022-12-06 NOTE — Progress Notes (Signed)
[facility] Follow up:    System, Provider Not In No address on file   DIAGNOSIS:  Cancer Staging  Malignant neoplasm of upper-inner quadrant of left breast in female, estrogen receptor positive ([place]) Staging form: Breast, AJCC 8th Edition - Clinical: Stage IIB (cT2, cN0, cM0, G3, ER+, PR-, HER2-) - Unsigned Stage prefix: Initial diagnosis Histologic grading system: 3 grade system   SUMMARY OF ONCOLOGIC HISTORY: Oncology History  Malignant neoplasm of upper-inner quadrant of left breast in female, estrogen receptor positive ([place])  12/26/2021 Genetic Testing   Negative.  The Common Hereditary Gene Panel offered by [facility] includes sequencing and/or deletion duplication testing of the following 47 genes: APC, ATM, AXIN2, BARD1, BMPR1A, BRCA1, BRCA2, BRIP1, CDH1, CDK4, CDKN2A (p14ARF), CDKN2A (p16INK4a), CHEK2, CTNNA1, DICER1, EPCAM (Deletion/duplication testing only), GREM1 (promoter region deletion/duplication testing only), KIT, MEN1, MLH1, MSH2, MSH3, MSH6, MUTYH, NBN, NF1, NHTL1, PALB2, PDGFRA, PMS2, POLD1, POLE, PTEN, RAD50, RAD51C, RAD51D, SDHB, SDHC, SDHD, SMAD4, SMARCA4. STK11, TP53, TSC1, TSC2, and VHL.  The following genes were evaluated for sequence changes only: SDHA and HOXB13 c.251G>A variant only.    12/31/2021 Initial Diagnosis   Malignant neoplasm of upper-inner quadrant of left breast in female, estrogen receptor positive ([place])   01/08/2022 -  Chemotherapy   Completed 4 cycles of neoadjuvant TC, last cycle received on March 12, 2022     04/21/2022 Definitive Surgery   She had left breast lumpectomy with no residual carcinoma status post neoadjuvant therapy, treatment related changes, no carcinoma identified in lymph nodes.  Final pathologic staging PT0N0 she also underwent bilateral mastopexy, benign breast with no evidence of carcinoma   05/28/2022 - 07/06/2022 Radiation Therapy   Site Technique Total Dose (Gy) Dose per Fx (Gy) Completed Fx Beam Energies   Breast, Left: Breast_L 3D 50.4/50.4 1.8 28/28 10XFFF     06/2022 -  Anti-estrogen oral therapy   Tamoxifen     CURRENT THERAPY: Tamoxifen daily  INTERVAL HISTORY: Maria Diaz 52 y.o. female is here today for some swollen lymph nodes that she has noticed in her left chest wall and it is painful in her upper left chest wall.  It has been going on for a couple of months, but it has been progressively worsening.    She is on tamoxifen without difficulty.  She completed radiation 06/2022.   Patient Active Problem List   Diagnosis Date Noted   Anemia 12/31/2021   Malignant neoplasm of upper-inner quadrant of left breast in female, estrogen receptor positive ([place]) 12/31/2021   Open angle with borderline findings and low glaucoma risk in both eyes 01/11/2016   Chronic idiopathic urticaria 01/11/2016   Age-related nuclear cataract of both eyes 01/11/2016   Eczema 01/11/2016    is allergic to haemophilus influenzae vaccines and penicillins.  MEDICAL HISTORY: Past Medical History:  Diagnosis Date   Anemia    Anxiety    Depression    Glaucoma    History of radiation therapy    Left breast 05/28/22-07/06/22-Dr. Gery Pray   Hives    chronic, on Xolair injections    SURGICAL HISTORY: Past Surgical History:  Procedure Laterality Date   BREAST BIOPSY Left 01/19/2022   BREAST CYST EXCISION Left 01/28/2021   Procedure: LEFT BREAST MASS EXCISION;  Surgeon: Rolm Bookbinder, MD;  Location: [facility];  Service: General;  Laterality: Left;  START TIME OF 3:00 PM FOR 60 MINUTES IN ROOM 8   BREAST LUMPECTOMY WITH RADIOACTIVE SEED AND SENTINEL LYMPH NODE  BIOPSY Left 04/13/2022   Procedure: LEFT BREAST BRACKETED LUMPECTOMY WITH RADIOACTIVE SEED AND AXILLARY SENTINEL LYMPH NODE BIOPSY;  Surgeon: Rolm Bookbinder, MD;  Location: [place];  Service: General;  Laterality: Left;   HYSTEROSCOPY W/ ENDOMETRIAL ABLATION  08/14/2020   [facility]   MASTOPEXY  Bilateral 04/21/2022   Procedure: MASTOPEXY;  Surgeon: Irene Limbo, MD;  Location: [facility];  Service: Plastics;  Laterality: Bilateral;    SOCIAL HISTORY: Social History   Socioeconomic History   Marital status: Divorced    Spouse name: Not on file   Number of children: Not on file   Years of education: Not on file   Highest education level: Not on file  Occupational History   Not on file  Tobacco Use   Smoking status: Never   Smokeless tobacco: Never  Substance and Sexual Activity   Alcohol use: Yes    Alcohol/week: 1.0 standard drink of alcohol    Types: 1 Glasses of wine per week    Comment: holidays   Drug use: Never   Sexual activity: Yes    Birth control/protection: None    Comment: ablation  Other Topics Concern   Not on file  Social History Narrative   Not on file   Social Determinants of Health   Financial Resource Strain: Not on file  Food Insecurity: Not on file  Transportation Needs: Not on file  Physical Activity: Not on file  Stress: Not on file  Social Connections: Not on file  Intimate Partner Violence: Not on file    FAMILY HISTORY: Family History  Problem Relation Age of Onset   Anxiety disorder Sister    Drug abuse Mother     Review of Systems  Constitutional:  Negative for appetite change, chills, fatigue, fever and unexpected weight change.  HENT:   Negative for hearing loss, lump/mass and trouble swallowing.   Eyes:  Negative for eye problems and icterus.  Respiratory:  Negative for chest tightness, cough and shortness of breath.   Cardiovascular:  Negative for chest pain, leg swelling and palpitations.  Gastrointestinal:  Negative for abdominal distention, abdominal pain, constipation, diarrhea, nausea and vomiting.  Endocrine: Negative for hot flashes.  Genitourinary:  Negative for difficulty urinating.   Musculoskeletal:  Negative for arthralgias.  Skin:  Negative for itching and rash.  Neurological:   Negative for dizziness, extremity weakness, headaches and numbness.  Hematological:  Negative for adenopathy. Does not bruise/bleed easily.  Psychiatric/Behavioral:  Negative for depression. The patient is not nervous/anxious.       PHYSICAL EXAMINATION  ECOG PERFORMANCE STATUS: 1 - Symptomatic but completely ambulatory  Vitals:   12/03/22 1530  BP: 112/68  Pulse: 76  Resp: 18  Temp: (!) 97.4 F (36.3 C)  SpO2: 97%    Physical Exam Constitutional:      General: She is not in acute distress.    Appearance: Normal appearance. She is not toxic-appearing.  HENT:     Head: Normocephalic and atraumatic.  Eyes:     General: No scleral icterus. Cardiovascular:     Rate and Rhythm: Normal rate and regular rhythm.     Pulses: Normal pulses.     Heart sounds: Normal heart sounds.  Pulmonary:     Effort: Pulmonary effort is normal.     Breath sounds: Normal breath sounds.  Abdominal:     General: Abdomen is flat. Bowel sounds are normal. There is no distension.     Palpations: Abdomen is soft.  Tenderness: There is no abdominal tenderness.  Musculoskeletal:        General: No swelling.     Cervical back: Neck supple.  Lymphadenopathy:     Cervical: No cervical adenopathy.  Skin:    General: Skin is warm and dry.     Findings: No rash.  Neurological:     General: No focal deficit present.     Mental Status: She is alert.  Psychiatric:        Mood and Affect: Mood normal.        Behavior: Behavior normal.     LABORATORY DATA:  CBC    Component Value Date/Time   WBC 12.1 (H) 03/12/2022 0826   WBC 8.0 07/20/2020 2320   RBC 4.13 03/12/2022 0826   HGB 12.7 03/12/2022 0826   HCT 37.8 03/12/2022 0826   PLT 314 03/12/2022 0826   MCV 91.5 03/12/2022 0826   MCH 30.8 03/12/2022 0826   MCHC 33.6 03/12/2022 0826   RDW 14.4 03/12/2022 0826   LYMPHSABS 1.2 03/12/2022 0826   MONOABS 0.9 03/12/2022 0826   EOSABS 0.0 03/12/2022 0826   BASOSABS 0.0 03/12/2022 0826     CMP     Component Value Date/Time   NA 140 03/12/2022 0826   K 3.8 03/12/2022 0826   CL 111 03/12/2022 0826   CO2 24 03/12/2022 0826   GLUCOSE 107 (H) 03/12/2022 0826   BUN 15 03/12/2022 0826   CREATININE 0.71 03/12/2022 0826   CALCIUM 9.8 03/12/2022 0826   PROT 7.7 03/12/2022 0826   ALBUMIN 4.2 03/12/2022 0826   AST 11 (L) 03/12/2022 0826   ALT 9 03/12/2022 0826   ALKPHOS 71 03/12/2022 0826   BILITOT 0.3 03/12/2022 0826   GFRNONAA >60 03/12/2022 0826   GFRAA >60 07/20/2020 2320         ASSESSMENT and THERAPY PLAN:   Malignant neoplasm of upper-inner quadrant of left breast in female, estrogen receptor positive ([facility]) Maria Diaz is a 53 year-old woman with stage IIB ER positive breast cancer diagnosed in 12/2021 s/p neoadjuvant chemotherapy, lumpectomy, adjuvant radiation therapy, and antiestrogen therapy with Tamoxifen daily.    On exam Maria Diaz has abnormality noted in both of her breasts . I placed orders for bilateral breast diagnostic mammogram and ultrasounds.  She will continue on Tamoxifen.  Based on mammogram results we may refer her to PT. We will f/u with her based on those results.   All questions were answered. The patient knows to call the clinic with any problems, questions or concerns. We can certainly see the patient much sooner if necessary.  Total encounter time:20 minutes*in face-to-face visit time, chart review, lab review, care coordination, order entry, and documentation of the encounter time.  Wilber Bihari, NP 12/06/22 10:21 PM Medical Oncology and Hematology [facility] [address] [address] [place], [place] [address] Tel. [phone]    Fax. [phone]  *Total Encounter Time as defined by the Centers for Medicare and Medicaid Services includes, in addition to the face-to-face time of a patient visit (documented in the note above) non-face-to-face time: obtaining and reviewing outside history, ordering and reviewing medications, tests or  procedures, care coordination (communications with other health care professionals or caregivers) and documentation in the medical record.

## 2022-12-06 NOTE — Assessment & Plan Note (Signed)
Maria Diaz is a 52 year-old woman with stage IIB ER positive breast cancer diagnosed in 12/2021 s/p neoadjuvant chemotherapy, lumpectomy, adjuvant radiation therapy, and antiestrogen therapy with Tamoxifen daily.    On exam Maria Diaz has abnormality noted in both of her breasts . I placed orders for bilateral breast diagnostic mammogram and ultrasounds.  She will continue on Tamoxifen.  Based on mammogram results we may refer her to PT. We will f/u with her based on those results.

## 2022-12-11 ENCOUNTER — Other Ambulatory Visit: Payer: Self-pay | Admitting: Otolaryngology

## 2022-12-14 ENCOUNTER — Encounter: Payer: Self-pay | Admitting: Hematology and Oncology

## 2022-12-14 ENCOUNTER — Encounter (HOSPITAL_COMMUNITY): Payer: Self-pay

## 2022-12-16 ENCOUNTER — Telehealth: Payer: Self-pay | Admitting: Hematology and Oncology

## 2022-12-16 NOTE — Telephone Encounter (Signed)
Contacted patient to scheduled appointments. Patient is aware of appointments that are scheduled.   

## 2022-12-23 ENCOUNTER — Ambulatory Visit (INDEPENDENT_AMBULATORY_CARE_PROVIDER_SITE_OTHER): Payer: 59 | Admitting: Psychiatry

## 2022-12-23 DIAGNOSIS — F3341 Major depressive disorder, recurrent, in partial remission: Secondary | ICD-10-CM

## 2022-12-23 DIAGNOSIS — F4310 Post-traumatic stress disorder, unspecified: Secondary | ICD-10-CM
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
## 2023-01-06 ENCOUNTER — Ambulatory Visit (INDEPENDENT_AMBULATORY_CARE_PROVIDER_SITE_OTHER): Payer: 59 | Admitting: Psychiatry

## 2023-01-06 DIAGNOSIS — F4310 Post-traumatic stress disorder, unspecified: Secondary | ICD-10-CM | POA: Diagnosis not present

## 2023-01-06 DIAGNOSIS — F3341 Major depressive disorder, recurrent, in partial remission: Secondary | ICD-10-CM | POA: Diagnosis not present
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
## 2023-01-13 ENCOUNTER — Encounter: Payer: Self-pay | Admitting: Psychiatry

## 2023-01-13 ENCOUNTER — Telehealth (INDEPENDENT_AMBULATORY_CARE_PROVIDER_SITE_OTHER): Payer: 59 | Admitting: Psychiatry

## 2023-01-13 DIAGNOSIS — F331 Major depressive disorder, recurrent, moderate: Secondary | ICD-10-CM

## 2023-01-13 DIAGNOSIS — G4700 Insomnia, unspecified: Secondary | ICD-10-CM

## 2023-01-13 DIAGNOSIS — F4310 Post-traumatic stress disorder, unspecified: Secondary | ICD-10-CM

## 2023-01-13 MED ORDER — MIRTAZAPINE 15 MG PO TABS: ORAL_TABLET | ORAL | 1 refills | Status: DC

## 2023-01-13 NOTE — Patient Instructions (Signed)
Continue sertraline 200 mg daily  Start mirtazapnie 7.5 mg at night for one week, then 15 mg at night Continue hydroxyzine 25 mg daily as needed for anxiety  Continue prazosin 2 mg at night Continue Trazodone 50-100 mg at night as needed for insomnia Next appointment- 4/3 at 10 AM

## 2023-01-18 ENCOUNTER — Ambulatory Visit: Payer: 59

## 2023-01-19 ENCOUNTER — Ambulatory Visit: Payer: 59 | Attending: Hematology and Oncology

## 2023-01-19 ENCOUNTER — Inpatient Hospital Stay: Payer: 59 | Attending: Hematology and Oncology | Admitting: Hematology and Oncology

## 2023-01-19 VITALS — BP 111/71 | HR 87 | Temp 97.7°F | Resp 16 | Ht 67.0 in | Wt 213.6 lb

## 2023-01-19 VITALS — Wt 213.5 lb

## 2023-01-19 DIAGNOSIS — Z170 Estrogen receptor positive status [ER+]: Secondary | ICD-10-CM | POA: Diagnosis not present

## 2023-01-19 DIAGNOSIS — Z923 Personal history of irradiation: Secondary | ICD-10-CM | POA: Insufficient documentation

## 2023-01-19 DIAGNOSIS — Z79810 Long term (current) use of selective estrogen receptor modulators (SERMs): Secondary | ICD-10-CM | POA: Diagnosis not present

## 2023-01-19 DIAGNOSIS — Z9221 Personal history of antineoplastic chemotherapy: Secondary | ICD-10-CM | POA: Insufficient documentation

## 2023-01-19 DIAGNOSIS — C50212 Malignant neoplasm of upper-inner quadrant of left female breast: Secondary | ICD-10-CM

## 2023-01-19 DIAGNOSIS — Z483 Aftercare following surgery for neoplasm: Secondary | ICD-10-CM

## 2023-01-19 NOTE — Assessment & Plan Note (Addendum)
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  She is doing well, recovering well from surgery. She completed radiation July 13, 2022. She is now on adj Tamoxifen. She is tolerating tamoxifen really well. I recommended she discontinue medroxyprogesterone. No concerning changes on exam, she does have significant lymphedema and post rad changes. Sent another referral to PT Most recent mammogram Jan 2024 neg for malignancy.

## 2023-01-19 NOTE — Therapy (Signed)
OUTPATIENT PHYSICAL THERAPY SOZO SCREENING NOTE   Patient Name: Maria Diaz MRN: [id number] DOB:[birth year], 52 y.o., female Today's Date: 01/19/2023  PCP: Luciano Cutter, DO REFERRING PROVIDER: Nicholas Lose, MD   PT End of Session - 01/19/23 1008     Visit Number 13   # unchanged due to screen only   PT Start Time 1007    PT Stop Time 1010    PT Time Calculation (min) 3 min    Activity Tolerance Patient tolerated treatment well    Behavior During Therapy [facility] for tasks assessed/performed             Past Medical History:  Diagnosis Date   Anemia    Anxiety    Depression    Glaucoma    History of radiation therapy    Left breast 05/28/22-07/06/22-Dr. Gery Pray   Hives    chronic, on Xolair injections   Past Surgical History:  Procedure Laterality Date   BREAST BIOPSY Left 01/19/2022   BREAST CYST EXCISION Left 01/28/2021   Procedure: LEFT BREAST MASS EXCISION;  Surgeon: Rolm Bookbinder, MD;  Location: [place];  Service: General;  Laterality: Left;  START TIME OF 3:00 PM FOR 60 MINUTES IN ROOM 8   BREAST LUMPECTOMY WITH RADIOACTIVE SEED AND SENTINEL LYMPH NODE BIOPSY Left 04/13/2022   Procedure: LEFT BREAST BRACKETED LUMPECTOMY WITH RADIOACTIVE SEED AND AXILLARY SENTINEL LYMPH NODE BIOPSY;  Surgeon: Rolm Bookbinder, MD;  Location: [place];  Service: General;  Laterality: Left;   HYSTEROSCOPY W/ ENDOMETRIAL ABLATION  08/14/2020   [facility]   MASTOPEXY Bilateral 04/21/2022   Procedure: MASTOPEXY;  Surgeon: Irene Limbo, MD;  Location: [place];  Service: Plastics;  Laterality: Bilateral;   Patient Active Problem List   Diagnosis Date Noted   Anemia 12/31/2021   Malignant neoplasm of upper-inner quadrant of left breast in female, estrogen receptor positive ([place]) 12/31/2021   Open angle with borderline findings and low glaucoma risk in both eyes 01/11/2016   Chronic idiopathic urticaria 01/11/2016    Age-related nuclear cataract of both eyes 01/11/2016   Eczema 01/11/2016    REFERRING DIAG: left breast cancer at risk for lymphedema  THERAPY DIAG:  Aftercare following surgery for neoplasm  PERTINENT HISTORY: Patient was diagnosed on 12/09/21 with left grade 3. It measures 3 cm and is located in the upper inner quadrant. 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 04/13/22- underwent L breast lumpectomy and SLNB (0/3) 04/21/22- bilateral mastopexy   PRECAUTIONS: left UE Lymphedema risk, None  SUBJECTIVE: Pt returns for her 3 month L-Dex screen. "I just saw the doctor and they are going to have me restart physical therapy again for the breast lymphedema. So I'll set that up before I leave today."  PAIN:  Are you having pain? No  SOZO SCREENING: Patient was assessed today using the SOZO machine to determine the lymphedema index score. This was compared to her baseline score. It was determined that she is within the recommended range when compared to her baseline and no further action is needed at this time. She will continue SOZO screenings. These are done every 3 months for 2 years post operatively followed by every 6 months for 2 years, and then annually.   L-DEX FLOWSHEETS - 01/19/23 1000       L-DEX LYMPHEDEMA SCREENING   Measurement Type Unilateral    L-DEX MEASUREMENT EXTREMITY Upper Extremity    POSITION  Standing    DOMINANT  SIDE Right    At Risk Side Left    BASELINE SCORE (UNILATERAL) 1.3    L-DEX SCORE (UNILATERAL) 4.3    VALUE CHANGE (UNILAT) 3               Otelia Limes, PTA 01/19/2023, 10:13 AM

## 2023-01-19 NOTE — Progress Notes (Signed)
[facility] Follow up:    Maria Diaz, [place] [address]   DIAGNOSIS:  Cancer Staging  Malignant neoplasm of upper-inner quadrant of left breast in female, estrogen receptor positive ([place]) Staging form: Breast, AJCC 8th Edition - Clinical: Stage IIB (cT2, cN0, cM0, G3, ER+, PR-, HER2-) - Unsigned Stage prefix: Initial diagnosis Histologic grading system: 3 grade system   SUMMARY OF ONCOLOGIC HISTORY: Oncology History  Malignant neoplasm of upper-inner quadrant of left breast in female, estrogen receptor positive ([place])  12/26/2021 Genetic Testing   Negative.  The Common Hereditary Gene Panel offered by [facility] includes sequencing and/or deletion duplication testing of the following 47 genes: APC, ATM, AXIN2, BARD1, BMPR1A, BRCA1, BRCA2, BRIP1, CDH1, CDK4, CDKN2A (p14ARF), CDKN2A (p16INK4a), CHEK2, CTNNA1, DICER1, EPCAM (Deletion/duplication testing only), GREM1 (promoter region deletion/duplication testing only), KIT, MEN1, MLH1, MSH2, MSH3, MSH6, MUTYH, NBN, NF1, NHTL1, PALB2, PDGFRA, PMS2, POLD1, POLE, PTEN, RAD50, RAD51C, RAD51D, SDHB, SDHC, SDHD, SMAD4, SMARCA4. STK11, TP53, TSC1, TSC2, and VHL.  The following genes were evaluated for sequence changes only: SDHA and HOXB13 c.251G>A variant only.    12/31/2021 Initial Diagnosis   Malignant neoplasm of upper-inner quadrant of left breast in female, estrogen receptor positive ([place])   01/08/2022 -  Chemotherapy   Completed 4 cycles of neoadjuvant TC, last cycle received on March 12, 2022     04/21/2022 Definitive Surgery   She had left breast lumpectomy with no residual carcinoma status post neoadjuvant therapy, treatment related changes, no carcinoma identified in lymph nodes.  Final pathologic staging PT0N0 she also underwent bilateral mastopexy, benign breast with no evidence of carcinoma   05/28/2022 - 07/06/2022 Radiation Therapy   Site Technique Total Dose (Gy) Dose per Fx (Gy) Completed Fx Beam  Energies  Breast, Left: Breast_L 3D 50.4/50.4 1.8 28/28 10XFFF     06/2022 -  Anti-estrogen oral therapy   Tamoxifen     CURRENT THERAPY: Tamoxifen daily  INTERVAL HISTORY: Maria Diaz 52 y.o. female is here today for follow up.  She is on tamoxifen without difficulty.  She completed radiation 06/2022. She is doing really well with tamoxifen.  No major changes. She is however experiencing a lot of pain in the left breast and left axilla and is waiting for physical therapy to contact her for lymphedema management.  She is also on medroxyprogesterone which she was instructed to discontinue.  Rest of the pertinent 10 point ROS reviewed and negative  Patient Active Problem List   Diagnosis Date Noted   Anemia 12/31/2021   Malignant neoplasm of upper-inner quadrant of left breast in female, estrogen receptor positive ([place]) 12/31/2021   Open angle with borderline findings and low glaucoma risk in both eyes 01/11/2016   Chronic idiopathic urticaria 01/11/2016   Age-related nuclear cataract of both eyes 01/11/2016   Eczema 01/11/2016    is allergic to haemophilus influenzae vaccines and penicillins.  MEDICAL HISTORY: Past Medical History:  Diagnosis Date   Anemia    Anxiety    Depression    Glaucoma    History of radiation therapy    Left breast 05/28/22-07/06/22-Dr. Gery Pray   Hives    chronic, on Xolair injections    SURGICAL HISTORY: Past Surgical History:  Procedure Laterality Date   BREAST BIOPSY Left 01/19/2022   BREAST CYST EXCISION Left 01/28/2021   Procedure: LEFT BREAST MASS EXCISION;  Surgeon: Rolm Bookbinder, MD;  Location: [place];  Service: General;  Laterality: Left;  START TIME OF 3:00 PM FOR 60 MINUTES IN ROOM 8   BREAST LUMPECTOMY WITH RADIOACTIVE SEED AND SENTINEL LYMPH NODE BIOPSY Left 04/13/2022   Procedure: LEFT BREAST BRACKETED LUMPECTOMY WITH RADIOACTIVE SEED AND AXILLARY SENTINEL LYMPH NODE BIOPSY;  Surgeon: Rolm Bookbinder, MD;  Location: [place];  Service: General;  Laterality: Left;   HYSTEROSCOPY W/ ENDOMETRIAL ABLATION  08/14/2020   [facility]   MASTOPEXY Bilateral 04/21/2022   Procedure: MASTOPEXY;  Surgeon: Irene Limbo, MD;  Location: [place];  Service: Plastics;  Laterality: Bilateral;    SOCIAL HISTORY: Social History   Socioeconomic History   Marital status: Divorced    Spouse name: Not on file   Number of children: Not on file   Years of education: Not on file   Highest education level: Not on file  Occupational History   Not on file  Tobacco Use   Smoking status: Never   Smokeless tobacco: Never  Substance and Sexual Activity   Alcohol use: Yes    Alcohol/week: 1.0 standard drink of alcohol    Types: 1 Glasses of wine per week    Comment: holidays   Drug use: Never   Sexual activity: Yes    Birth control/protection: None    Comment: ablation  Other Topics Concern   Not on file  Social History Narrative   Not on file   Social Determinants of Health   Financial Resource Strain: Not on file  Food Insecurity: Not on file  Transportation Needs: Not on file  Physical Activity: Not on file  Stress: Not on file  Social Connections: Not on file  Intimate Partner Violence: Not on file    FAMILY HISTORY: Family History  Problem Relation Age of Onset   Anxiety disorder Sister    Drug abuse Mother     Review of Systems  Constitutional:  Negative for appetite change, chills, fatigue, fever and unexpected weight change.  HENT:   Negative for hearing loss, lump/mass and trouble swallowing.   Eyes:  Negative for eye problems and icterus.  Respiratory:  Negative for chest tightness, cough and shortness of breath.   Cardiovascular:  Negative for chest pain, leg swelling and palpitations.  Gastrointestinal:  Negative for abdominal distention, abdominal pain, constipation, diarrhea, nausea and vomiting.  Endocrine: Negative for hot flashes.   Genitourinary:  Negative for difficulty urinating.   Musculoskeletal:  Negative for arthralgias.  Skin:  Negative for itching and rash.  Neurological:  Negative for dizziness, extremity weakness, headaches and numbness.  Hematological:  Negative for adenopathy. Does not bruise/bleed easily.  Psychiatric/Behavioral:  Negative for depression. The patient is not nervous/anxious.       PHYSICAL EXAMINATION  ECOG PERFORMANCE STATUS: 1 - Symptomatic but completely ambulatory  Vitals:   01/19/23 0859  BP: 111/71  Pulse: 87  Resp: 16  Temp: 97.7 F (36.5 C)  SpO2: 97%    Physical Exam Constitutional:      General: She is not in acute distress.    Appearance: Normal appearance. She is not toxic-appearing.  HENT:     Head: Normocephalic and atraumatic.  Eyes:     General: No scleral icterus. Chest:       Comments: Post radiation changes and lymphedema of the breast and axilla. No palpable masses. No regional adenopathy Abdominal:     General: Abdomen is flat. Bowel sounds are normal. There is no distension.     Palpations: Abdomen is soft.     Tenderness: There is  no abdominal tenderness.  Musculoskeletal:        General: No swelling.     Cervical back: Neck supple.  Lymphadenopathy:     Cervical: No cervical adenopathy.  Skin:    General: Skin is warm and dry.     Findings: No rash.  Neurological:     General: No focal deficit present.     Mental Status: She is alert.  Psychiatric:        Mood and Affect: Mood normal.        Behavior: Behavior normal.     LABORATORY DATA:  CBC    Component Value Date/Time   WBC 12.1 (H) 03/12/2022 0826   WBC 8.0 07/20/2020 2320   RBC 4.13 03/12/2022 0826   HGB 12.7 03/12/2022 0826   HCT 37.8 03/12/2022 0826   PLT 314 03/12/2022 0826   MCV 91.5 03/12/2022 0826   MCH 30.8 03/12/2022 0826   MCHC 33.6 03/12/2022 0826   RDW 14.4 03/12/2022 0826   LYMPHSABS 1.2 03/12/2022 0826   MONOABS 0.9 03/12/2022 0826   EOSABS 0.0  03/12/2022 0826   BASOSABS 0.0 03/12/2022 0826    CMP     Component Value Date/Time   NA 140 03/12/2022 0826   K 3.8 03/12/2022 0826   CL 111 03/12/2022 0826   CO2 24 03/12/2022 0826   GLUCOSE 107 (H) 03/12/2022 0826   BUN 15 03/12/2022 0826   CREATININE 0.71 03/12/2022 0826   CALCIUM 9.8 03/12/2022 0826   PROT 7.7 03/12/2022 0826   ALBUMIN 4.2 03/12/2022 0826   AST 11 (L) 03/12/2022 0826   ALT 9 03/12/2022 0826   ALKPHOS 71 03/12/2022 0826   BILITOT 0.3 03/12/2022 0826   GFRNONAA >60 03/12/2022 0826   GFRAA >60 07/20/2020 2320         ASSESSMENT and THERAPY PLAN:   Malignant neoplasm of upper-inner quadrant of left breast in female, estrogen receptor positive ([place]) This is a very pleasant 52 year old female patient with 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  She is doing well, recovering well from surgery. She completed radiation July 13, 2022. She is now on adj Tamoxifen. She is tolerating tamoxifen really well. I recommended she discontinue medroxyprogesterone. No concerning changes on exam, she does have significant lymphedema and post rad changes. Sent another referral to PT Most recent mammogram Jan 2024 neg for malignancy.   All questions were answered. The patient knows to call the clinic with any problems, questions or concerns. We can certainly see the patient much sooner if necessary.  Total encounter time:20 minutes*in face-to-face visit time, chart review, lab review, care coordination, order entry, and documentation of the encounter time.   *Total Encounter Time as defined by the Centers for Medicare and Medicaid Services includes, in addition to the face-to-face time of a patient visit (documented in the note above)  non-face-to-face time: obtaining and reviewing outside history, ordering and reviewing medications, tests or procedures, care coordination (communications with other health care professionals or caregivers) and documentation in the medical record.

## 2023-01-20 ENCOUNTER — Ambulatory Visit: Payer: 59 | Admitting: Hematology and Oncology

## 2023-01-20 ENCOUNTER — Telehealth: Payer: Self-pay | Admitting: Hematology and Oncology

## 2023-01-20 NOTE — Telephone Encounter (Signed)
Spoke with patient confirming upcoming appointment  

## 2023-01-25 ENCOUNTER — Ambulatory Visit (INDEPENDENT_AMBULATORY_CARE_PROVIDER_SITE_OTHER): Payer: 59 | Admitting: Psychiatry

## 2023-01-25 DIAGNOSIS — F331 Major depressive disorder, recurrent, moderate: Secondary | ICD-10-CM

## 2023-01-25 DIAGNOSIS — F4310 Post-traumatic stress disorder, unspecified: Secondary | ICD-10-CM
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
## 2023-01-26 ENCOUNTER — Encounter: Payer: Self-pay | Admitting: Rehabilitation

## 2023-01-26 ENCOUNTER — Ambulatory Visit: Payer: 59 | Attending: Hematology and Oncology | Admitting: Rehabilitation

## 2023-01-26 DIAGNOSIS — C50212 Malignant neoplasm of upper-inner quadrant of left female breast: Secondary | ICD-10-CM | POA: Diagnosis not present

## 2023-01-26 DIAGNOSIS — R609 Edema, unspecified: Secondary | ICD-10-CM | POA: Insufficient documentation

## 2023-01-26 DIAGNOSIS — L599 Disorder of the skin and subcutaneous tissue related to radiation, unspecified: Secondary | ICD-10-CM

## 2023-01-26 DIAGNOSIS — Z170 Estrogen receptor positive status [ER+]: Secondary | ICD-10-CM

## 2023-01-26 DIAGNOSIS — M25612 Stiffness of left shoulder, not elsewhere classified: Secondary | ICD-10-CM

## 2023-01-26 DIAGNOSIS — R293 Abnormal posture: Secondary | ICD-10-CM | POA: Insufficient documentation

## 2023-01-26 DIAGNOSIS — Z483 Aftercare following surgery for neoplasm: Secondary | ICD-10-CM

## 2023-01-26 DIAGNOSIS — M79622 Pain in left upper arm: Secondary | ICD-10-CM | POA: Diagnosis not present

## 2023-01-26 NOTE — Therapy (Signed)
OUTPATIENT PHYSICAL THERAPY ONCOLOGY EVALUATION  Patient Name: Maria Diaz MRN: [id number] DOB:[birth year], 52 y.o., female Today's Date: 01/26/2023   PT End of Session - 01/26/23 1000     Visit Number 14    Number of Visits 22    Date for PT Re-Evaluation 02/23/23    Authorization Type no auth needed    PT Start Time 1005    PT Stop Time 1052    PT Time Calculation (min) 47 min    Activity Tolerance Patient tolerated treatment well    Behavior During Therapy [facility] for tasks assessed/performed              Past Medical History:  Diagnosis Date   Anemia    Anxiety    Depression    Glaucoma    History of radiation therapy    Left breast 05/28/22-07/06/22-Dr. Gery Pray   Hives    chronic, on Xolair injections   Past Surgical History:  Procedure Laterality Date   BREAST BIOPSY Left 01/19/2022   BREAST CYST EXCISION Left 01/28/2021   Procedure: LEFT BREAST MASS EXCISION;  Surgeon: Rolm Bookbinder, MD;  Location: [place];  Service: General;  Laterality: Left;  START TIME OF 3:00 PM FOR 60 MINUTES IN ROOM 8   BREAST LUMPECTOMY WITH RADIOACTIVE SEED AND SENTINEL LYMPH NODE BIOPSY Left 04/13/2022   Procedure: LEFT BREAST BRACKETED LUMPECTOMY WITH RADIOACTIVE SEED AND AXILLARY SENTINEL LYMPH NODE BIOPSY;  Surgeon: Rolm Bookbinder, MD;  Location: [place];  Service: General;  Laterality: Left;   HYSTEROSCOPY W/ ENDOMETRIAL ABLATION  08/14/2020   [facility]   MASTOPEXY Bilateral 04/21/2022   Procedure: MASTOPEXY;  Surgeon: Irene Limbo, MD;  Location: [place];  Service: Plastics;  Laterality: Bilateral;   Patient Active Problem List   Diagnosis Date Noted   Anemia 12/31/2021   Malignant neoplasm of upper-inner quadrant of left breast in female, estrogen receptor positive ([place]) 12/31/2021   Open angle with borderline findings and low glaucoma risk in both eyes 01/11/2016   Chronic idiopathic urticaria 01/11/2016    Age-related nuclear cataract of both eyes 01/11/2016   Eczema 01/11/2016    PCP: [name], MD  REFERRING PROVIDER: Dr. Chryl Heck  REFERRING DIAG: C50.212,Z17.0 (ICD-10-CM) - Malignant neoplasm of upper-inner quadrant of left breast in female, estrogen receptor positive ([place])  THERAPY DIAG:  Aftercare following surgery for neoplasm  Lymphedema, not elsewhere classified  Malignant neoplasm of upper-inner quadrant of left breast in female, estrogen receptor positive ([place])  ONSET DATE: 12/09/21  Rationale for Evaluation and Treatment Rehabilitation  SUBJECTIVE  SUBJECTIVE STATEMENT: EVAL: There has been tingling in the fingers again (digits 2-4).  It gets worse with typing and work activities maybe 2x per week then I can change position and shake it out.  The armpit is a constant gnawing pain and ache and feels like something is in there.  It has been worse again for maybe 2 months.    PERTINENT HISTORY:  Patient was diagnosed on 12/09/21 with left grade 3. It measures 3 cm and is located in the upper inner quadrant. 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 04/13/22- underwent L breast lumpectomy and SLNB (0/3) 04/21/22- bilateral mastopexy. Completed radiation in August.    PAIN: 6/10 in the axilla   PRECAUTIONS: Other: at risk of lymphedema  WEIGHT BEARING RESTRICTIONS No  FALLS:  Has patient fallen in last 6 months? 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  LEISURE: pt has been doing PT exercises 2x/wk and on the other days pt walks  HAND DOMINANCE : right   PRIOR LEVEL OF FUNCTION:  Independent  PATIENT GOALS to avoid lymphedema, decrease pain   OBJECTIVE  COGNITION:  Overall cognitive status: Within functional limits for tasks assessed   PALPATION:   OBSERVATIONS / OTHER ASSESSMENTS:   POSTURE: forward head, rounded shoulders  UPPER EXTREMITY AROM/PROM:  A/PROM RIGHT   eval  01/26/23  Shoulder extension 62   Shoulder flexion 155   Shoulder abduction 177   Shoulder internal rotation 70 Behind the back to T8  Shoulder external rotation 88     (Blank rows = not tested)  A/PROM LEFT   eval 01/26/23  Shoulder extension 57 40 - a little tingling  Shoulder flexion 160 with p! 150 - with pain in the axilla   Shoulder abduction 125 150  Shoulder internal rotation 60 Behind the back to L5 with pain  Shoulder external rotation 90 55 - shoulder pain    (Blank rows = not tested)  UE MMT: 4+/5 without pain  Roos test:no tingling on the Rt side over 30 seconds, tingling immediately on the left side.   + ULTT median both positions and ulnar nerve  LYMPHEDEMA ASSESSMENTS:  SURGERY TYPE/DATE: 04/13/22 L lumpectomy and SLNB NUMBER OF LYMPH NODES REMOVED: 0/3 CHEMOTHERAPY: Completed RADIATION:completed HORMONE TREATMENT: tamoxifen - currently taking INFECTIONS: none  TODAY'S TREATMENT  01/26/23 EVAL completed STM left UT, pectoralis, axillary borders, lat insertion with PROM  Updated HEP to include per below  PATIENT EDUCATION:  Education details: cording and importance of stretching to reduce cording, L breast lymphedema vs. Post op radiation edema, vs edema from cording Person educated: Patient Education method: Explanation Education comprehension: verbalized understanding  HOME EXERCISE PROGRAM: Access Code: [email or web address] URL: [email or web address] Date: 01/26/2023 Prepared by: Shan Levans  Exercises - Median Nerve Flossing  - 1 x daily - 7 x weekly - 1 sets - 10 reps - no hold - Ulnar Nerve Flossing  - 1 x daily - 7 x weekly - 1 sets - 10  reps - no hold - Doorway Pec Stretch at 90 Degrees Abduction  - 2-3 x daily - 7 x weekly - 1 sets - 3 reps - 20-30 seconds hold - Standing shoulder flexion wall slides  - 1-2 x daily - 7 x weekly - 1 sets - 5 reps - 20-30  seconds hold - Seated Cervical Sidebending Stretch  - 1-2 x daily - 7 x weekly - 1 sets - 3 reps - 20-30 seconds hold  ASSESSMENT: CLINICAL IMPRESSION: Patient is a 52 y.o. female who was seen today for physical therapy evaluation and treatment for L upper quadrant pain, tightness, and thoracic outlet like symptoms of tingling into the hand which have been present the past 2 months.  She has overall increased muscular tension with TrP in the UT and pectoralis .  She also continues with congestion in the Lt axilla which may be scar tissue vs edema.  We discussed DN which pt is interested in trying and we will add to the POC today.    OBJECTIVE IMPAIRMENTS decreased ROM, increased edema, increased fascial restrictions, impaired UE functional use, postural dysfunction, and pain.   ACTIVITY LIMITATIONS carrying, lifting, and reach over head  PARTICIPATION LIMITATIONS:  none  PERSONAL FACTORS  none  are also affecting patient's functional outcome.   REHAB POTENTIAL: Good  CLINICAL DECISION MAKING: Stable/uncomplicated  EVALUATION COMPLEXITY: Low  GOALS: Goals reviewed with patient? Yes  SHORT TERM GOALS=LONG TERM GOALS  Target date: 02/23/2023    Pt will be independent in HEP for continued mobility and strength  Baseline: Goal status: INITIAL  2.  Pt will report a 75% improvement in pain in the left axilla for improved comfort.  Baseline:  Goal status: INITIAL   PLAN: PT FREQUENCY: 2x/week  PT DURATION: 4 weeks  PLANNED INTERVENTIONS: Therapeutic exercises, Therapeutic activity, Patient/Family education, Self Care, Joint mobilization, Manual lymph drainage, Compression bandaging, scar mobilization, Taping, Vasopneumatic device, dry needling and Manual  therapy  PLAN FOR NEXT SESSION: Lt shoulder/upper quadrant STM/MFR, DN for UT, scapular/neck muscles as needed   Tiajah Oyster, Adrian Prince, PT 01/26/2023, 11:00 AM

## 2023-01-28 ENCOUNTER — Ambulatory Visit: Payer: 59

## 2023-01-28 DIAGNOSIS — Z483 Aftercare following surgery for neoplasm: Secondary | ICD-10-CM

## 2023-01-28 DIAGNOSIS — L599 Disorder of the skin and subcutaneous tissue related to radiation, unspecified: Secondary | ICD-10-CM

## 2023-01-28 DIAGNOSIS — C50212 Malignant neoplasm of upper-inner quadrant of left female breast: Secondary | ICD-10-CM | POA: Diagnosis not present

## 2023-01-28 DIAGNOSIS — M25612 Stiffness of left shoulder, not elsewhere classified: Secondary | ICD-10-CM

## 2023-01-28 DIAGNOSIS — Z170 Estrogen receptor positive status [ER+]: Secondary | ICD-10-CM

## 2023-01-28 NOTE — Therapy (Signed)
OUTPATIENT PHYSICAL THERAPY ONCOLOGY TREATMENT  Patient Name: Maria Diaz MRN: [id number] DOB:1971/03/06, 52 y.o., female Today's Date: 01/28/2023   PT End of Session - 01/28/23 0905     Visit Number 15    Number of Visits 22    Date for PT Re-Evaluation 02/23/23    PT Start Time 0903    PT Stop Time 0959    PT Time Calculation (min) 56 min    Activity Tolerance Patient tolerated treatment well    Behavior During Therapy [facility] for tasks assessed/performed              Past Medical History:  Diagnosis Date   Anemia    Anxiety    Depression    Glaucoma    History of radiation therapy    Left breast 05/28/22-07/06/22-Dr. Gery Pray   Hives    chronic, on Xolair injections   Past Surgical History:  Procedure Laterality Date   BREAST BIOPSY Left 01/19/2022   BREAST CYST EXCISION Left 01/28/2021   Procedure: LEFT BREAST MASS EXCISION;  Surgeon: Rolm Bookbinder, MD;  Location: [place];  Service: General;  Laterality: Left;  START TIME OF 3:00 PM FOR 60 MINUTES IN ROOM 8   BREAST LUMPECTOMY WITH RADIOACTIVE SEED AND SENTINEL LYMPH NODE BIOPSY Left 04/13/2022   Procedure: LEFT BREAST BRACKETED LUMPECTOMY WITH RADIOACTIVE SEED AND AXILLARY SENTINEL LYMPH NODE BIOPSY;  Surgeon: Rolm Bookbinder, MD;  Location: [place];  Service: General;  Laterality: Left;   HYSTEROSCOPY W/ ENDOMETRIAL ABLATION  08/14/2020   [facility]   MASTOPEXY Bilateral 04/21/2022   Procedure: MASTOPEXY;  Surgeon: Irene Limbo, MD;  Location: [place];  Service: Plastics;  Laterality: Bilateral;   Patient Active Problem List   Diagnosis Date Noted   Anemia 12/31/2021   Malignant neoplasm of upper-inner quadrant of left breast in female, estrogen receptor positive ([place]) 12/31/2021   Open angle with borderline findings and low glaucoma risk in both eyes 01/11/2016   Chronic idiopathic urticaria 01/11/2016   Age-related nuclear cataract of both eyes  01/11/2016   Eczema 01/11/2016    PCP: [name], MD  REFERRING PROVIDER: Dr. Chryl Heck  REFERRING DIAG: C50.212,Z17.0 (ICD-10-CM) - Malignant neoplasm of upper-inner quadrant of left breast in female, estrogen receptor positive ([place])  THERAPY DIAG:  Aftercare following surgery for neoplasm  Malignant neoplasm of upper-inner quadrant of left breast in female, estrogen receptor positive ([place])  Stiffness of left shoulder, not elsewhere classified  Disorder of the skin and subcutaneous tissue related to radiation, unspecified  ONSET DATE: 12/09/21  Rationale for Evaluation and Treatment Rehabilitation  SUBJECTIVE  SUBJECTIVE STATEMENT: My Lt shoulder is okay today but I think I've kind of just gotten used to the pain.    PERTINENT HISTORY:  Patient was diagnosed on 12/09/21 with left grade 3. It measures 3 cm and is located in the upper inner quadrant. 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 04/13/22- underwent L breast lumpectomy and SLNB (0/3) 04/21/22- bilateral mastopexy. Completed radiation in August.    PAIN: 6/10 in the axilla   PRECAUTIONS: Other: at risk of lymphedema  WEIGHT BEARING RESTRICTIONS No  FALLS:  Has patient fallen in last 6 months? 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  LEISURE: pt has been doing PT exercises 2x/wk and on the other days pt walks  HAND DOMINANCE : right   PRIOR LEVEL OF FUNCTION: Independent  PATIENT GOALS to avoid lymphedema, decrease pain   OBJECTIVE  COGNITION:  Overall cognitive status: Within functional limits for tasks  assessed   PALPATION:   OBSERVATIONS / OTHER ASSESSMENTS:   POSTURE: forward head, rounded shoulders  UPPER EXTREMITY AROM/PROM:  A/PROM RIGHT   eval  01/26/23  Shoulder extension 62   Shoulder flexion 155   Shoulder abduction 177   Shoulder internal rotation 70 Behind the back to T8  Shoulder external rotation 88     (Blank rows = not tested)  A/PROM LEFT   eval 01/26/23  Shoulder extension 57 40 - a little tingling  Shoulder flexion 160 with p! 150 - with pain in the axilla   Shoulder abduction 125 150  Shoulder internal rotation 60 Behind the back to L5 with pain  Shoulder external rotation 90 55 - shoulder pain    (Blank rows = not tested)  UE MMT: 4+/5 without pain  Roos test:no tingling on the Rt side over 30 seconds, tingling immediately on the left side.   + ULTT median both positions and ulnar nerve  LYMPHEDEMA ASSESSMENTS:  SURGERY TYPE/DATE: 04/13/22 L lumpectomy and SLNB NUMBER OF LYMPH NODES REMOVED: 0/3 CHEMOTHERAPY: Completed RADIATION:completed HORMONE TREATMENT: tamoxifen - currently taking INFECTIONS: none  TODAY'S TREATMENT  01/28/23: Pulleys into flexion and abd x2 mins each with VC's to decrease Lt scapular compensation as pt is struggling with this due to muscle guarding Manual Therapy P/ROM of Lt shoulder into flexion, abd and D2 with scapular depression by therapist throughout and VC's for to relax due to muscle guarding STM with cocoa butter to Lt UT, pectoralis, axilla and lat insertion where pt palpably tight in supine and Rt S/L Scap Mobs in Rt S/L to Lt scapula into retraction and protraction to help improve scap mobility that is limiting to her end P/ROM  01/26/23 EVAL completed STM left UT, pectoralis, axillary borders, lat insertion with PROM  Updated HEP to include per below  PATIENT EDUCATION:  Education details: cording and importance of stretching to reduce cording, L breast lymphedema vs. Post op radiation edema, vs edema from  cording Person educated: Patient Education method: Explanation Education comprehension: verbalized understanding  HOME EXERCISE PROGRAM: Access Code: [email or web address] URL: [email or web address] Date: 01/26/2023 Prepared by: Shan Levans  Exercises - Median Nerve Flossing  - 1 x daily - 7 x weekly - 1 sets - 10 reps - no hold - Ulnar Nerve Flossing  - 1 x daily - 7 x weekly - 1 sets -  10 reps - no hold - Doorway Pec Stretch at 90 Degrees Abduction  - 2-3 x daily - 7 x weekly - 1 sets - 3 reps - 20-30 seconds hold - Standing shoulder flexion wall slides  - 1-2 x daily - 7 x weekly - 1 sets - 5 reps - 20-30 seconds hold - Seated Cervical Sidebending Stretch  - 1-2 x daily - 7 x weekly - 1 sets - 3 reps - 20-30 seconds hold  ASSESSMENT: CLINICAL IMPRESSION: Pt returns for first full session since evaluation. Started with pulleys for Aa/ROM. Pt reports feeling good stretches with these but does struggle with decreasing scapular compensation due to muscular tightness including limited scap mobility and muscle guarding. Then continued with manual therapy working to decrease Lt upper quadrant tightness and improve her end P/ROM. Pt is scheduled for DN trial next week.   OBJECTIVE IMPAIRMENTS decreased ROM, increased edema, increased fascial restrictions, impaired UE functional use, postural dysfunction, and pain.   ACTIVITY LIMITATIONS carrying, lifting, and reach over head  PARTICIPATION LIMITATIONS:  none  PERSONAL FACTORS  none  are also affecting patient's functional outcome.   REHAB POTENTIAL: Good  CLINICAL DECISION MAKING: Stable/uncomplicated  EVALUATION COMPLEXITY: Low  GOALS: Goals reviewed with patient? Yes  SHORT TERM GOALS=LONG TERM GOALS  Target date: 02/23/2023    Pt will be independent in HEP for continued mobility and strength  Baseline: Goal status: INITIAL  2.  Pt will report a 75% improvement in pain in the left axilla for improved comfort.  Baseline:   Goal status: INITIAL   PLAN: PT FREQUENCY: 2x/week  PT DURATION: 4 weeks  PLANNED INTERVENTIONS: Therapeutic exercises, Therapeutic activity, Patient/Family education, Self Care, Joint mobilization, Manual lymph drainage, Compression bandaging, scar mobilization, Taping, Vasopneumatic device, dry needling and Manual therapy  PLAN FOR NEXT SESSION: Cont Lt shoulder/upper quadrant STM/MFR, DN for UT, scapular/neck muscles as needed   Otelia Limes, PTA 01/28/2023, 10:10 AM

## 2023-02-02 ENCOUNTER — Telehealth: Payer: Self-pay | Admitting: Psychiatry

## 2023-02-02 NOTE — Telephone Encounter (Signed)
Defer to Dr. Hisada 

## 2023-02-02 NOTE — Telephone Encounter (Signed)
Patient emailed form to compete for her employer to turn in with other worksheets they are requesting by February 05, 2023. Patient informed Dr. Modesta Messing will return on Monday, February 08, 2023 and will complete at that time.

## 2023-02-03 ENCOUNTER — Ambulatory Visit: Payer: 59

## 2023-02-03 DIAGNOSIS — L599 Disorder of the skin and subcutaneous tissue related to radiation, unspecified: Secondary | ICD-10-CM

## 2023-02-03 DIAGNOSIS — M25612 Stiffness of left shoulder, not elsewhere classified: Secondary | ICD-10-CM

## 2023-02-03 DIAGNOSIS — C50212 Malignant neoplasm of upper-inner quadrant of left female breast: Secondary | ICD-10-CM | POA: Diagnosis not present

## 2023-02-03 DIAGNOSIS — Z483 Aftercare following surgery for neoplasm: Secondary | ICD-10-CM

## 2023-02-03 NOTE — Therapy (Signed)
OUTPATIENT PHYSICAL THERAPY ONCOLOGY TREATMENT  Patient Name: Maria Diaz MRN: [id number] DOB:1971/03/02, 52 y.o., female Today's Date: 02/03/2023   PT End of Session - 02/03/23 1005     Visit Number 16    Number of Visits 22    Date for PT Re-Evaluation 02/23/23    PT Start Time 1004    PT Stop Time 1102    PT Time Calculation (min) 58 min    Activity Tolerance Patient tolerated treatment well    Behavior During Therapy [facility] for tasks assessed/performed              Past Medical History:  Diagnosis Date   Anemia    Anxiety    Depression    Glaucoma    History of radiation therapy    Left breast 05/28/22-07/06/22-Dr. Gery Pray   Hives    chronic, on Xolair injections   Past Surgical History:  Procedure Laterality Date   BREAST BIOPSY Left 01/19/2022   BREAST CYST EXCISION Left 01/28/2021   Procedure: LEFT BREAST MASS EXCISION;  Surgeon: Rolm Bookbinder, MD;  Location: [place];  Service: General;  Laterality: Left;  START TIME OF 3:00 PM FOR 60 MINUTES IN ROOM 8   BREAST LUMPECTOMY WITH RADIOACTIVE SEED AND SENTINEL LYMPH NODE BIOPSY Left 04/13/2022   Procedure: LEFT BREAST BRACKETED LUMPECTOMY WITH RADIOACTIVE SEED AND AXILLARY SENTINEL LYMPH NODE BIOPSY;  Surgeon: Rolm Bookbinder, MD;  Location: [place];  Service: General;  Laterality: Left;   HYSTEROSCOPY W/ ENDOMETRIAL ABLATION  08/14/2020   [facility]   MASTOPEXY Bilateral 04/21/2022   Procedure: MASTOPEXY;  Surgeon: Irene Limbo, MD;  Location: [place];  Service: Plastics;  Laterality: Bilateral;   Patient Active Problem List   Diagnosis Date Noted   Anemia 12/31/2021   Malignant neoplasm of upper-inner quadrant of left breast in female, estrogen receptor positive ([place]) 12/31/2021   Open angle with borderline findings and low glaucoma risk in both eyes 01/11/2016   Chronic idiopathic urticaria 01/11/2016   Age-related nuclear cataract of both eyes  01/11/2016   Eczema 01/11/2016    PCP: [name], MD  REFERRING PROVIDER: Dr. Chryl Heck  REFERRING DIAG: C50.212,Z17.0 (ICD-10-CM) - Malignant neoplasm of upper-inner quadrant of left breast in female, estrogen receptor positive ([place])  THERAPY DIAG:  Aftercare following surgery for neoplasm  Malignant neoplasm of upper-inner quadrant of left breast in female, estrogen receptor positive ([place])  Stiffness of left shoulder, not elsewhere classified  Disorder of the skin and subcutaneous tissue related to radiation, unspecified  ONSET DATE: 12/09/21  Rationale for Evaluation and Treatment Rehabilitation  SUBJECTIVE  SUBJECTIVE STATEMENT: I am feeling good today. I already went for a walk this morning and my shoulder isn't hurting right now.     PERTINENT HISTORY:  Patient was diagnosed on 12/09/21 with left grade 3. It measures 3 cm and is located in the upper inner quadrant. 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 04/13/22- underwent L breast lumpectomy and SLNB (0/3) 04/21/22- bilateral mastopexy. Completed radiation in August.    PAIN: 6/10 in the axilla   PRECAUTIONS: Other: at risk of lymphedema  WEIGHT BEARING RESTRICTIONS No  FALLS:  Has patient fallen in last 6 months? 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  LEISURE: pt has been doing PT exercises 2x/wk and on the other days pt walks  HAND DOMINANCE : right   PRIOR LEVEL OF FUNCTION: Independent  PATIENT GOALS to avoid lymphedema, decrease pain   OBJECTIVE  COGNITION:  Overall cognitive status: Within functional limits for  tasks assessed   PALPATION:   OBSERVATIONS / OTHER ASSESSMENTS:   POSTURE: forward head, rounded shoulders  UPPER EXTREMITY AROM/PROM:  A/PROM RIGHT   eval  01/26/23  Shoulder extension 62   Shoulder flexion 155   Shoulder abduction 177   Shoulder internal rotation 70 Behind the back to T8  Shoulder external rotation 88     (Blank rows = not tested)  A/PROM LEFT   eval 01/26/23  Shoulder extension 57 40 - a little tingling  Shoulder flexion 160 with p! 150 - with pain in the axilla   Shoulder abduction 125 150  Shoulder internal rotation 60 Behind the back to L5 with pain  Shoulder external rotation 90 55 - shoulder pain    (Blank rows = not tested)  UE MMT: 4+/5 without pain  Roos test:no tingling on the Rt side over 30 seconds, tingling immediately on the left side.   + ULTT median both positions and ulnar nerve  LYMPHEDEMA ASSESSMENTS:  SURGERY TYPE/DATE: 04/13/22 L lumpectomy and SLNB NUMBER OF LYMPH NODES REMOVED: 0/3 CHEMOTHERAPY: Completed RADIATION:completed HORMONE TREATMENT: tamoxifen - currently taking INFECTIONS: none  TODAY'S TREATMENT  02/03/23: Therapeutic Exercises  Pulleys into flexion and abd x2 mins each with VC's to decrease Lt scapular compensation though pt was some improved with this from last session Supine over foam roll for following: Bi UE abd and then bil UE scaption into a "V" x10 each returning therapist demo; then supine on mat for bil UE abd or "snow angel" x5, 5 sec holds Then Lt S/L for Rt UE open book stretch, then same on contralateral side 5x each side Manual Therapy P/ROM of Lt shoulder into flexion, abd and D2 with scapular depression by therapist throughout and VC's for to relax due to muscle guarding STM with cocoa butter to Lt UT, pectoralis, axilla and lat insertion where pt palpably tight in supine and Rt S/L; also to inferior Lt breast iwht cocoa butter in supine where pt reports still feeling tightness with end range shoulder  stretches.  Scap Mobs in Rt S/L to Lt scapula into retraction and protraction to help improve scap mobility that conts to limit her end P/ROM  01/28/23: Pulleys into flexion and abd x2 mins each with VC's to decrease Lt scapular compensation as pt is struggling with this due to muscle guarding Manual Therapy P/ROM  of Lt shoulder into flexion, abd and D2 with scapular depression by therapist throughout and VC's for to relax due to muscle guarding STM with cocoa butter to Lt UT, pectoralis, axilla and lat insertion where pt palpably tight in supine and Rt S/L Scap Mobs in Rt S/L to Lt scapula into retraction and protraction to help improve scap mobility that is limiting to her end P/ROM  01/26/23 EVAL completed STM left UT, pectoralis, axillary borders, lat insertion with PROM  Updated HEP to include per below  PATIENT EDUCATION:  Education details: cording and importance of stretching to reduce cording, L breast lymphedema vs. Post op radiation edema, vs edema from cording Person educated: Patient Education method: Explanation Education comprehension: verbalized understanding  HOME EXERCISE PROGRAM: Access Code: [email or web address] URL: [email or web address] Date: 01/26/2023 Prepared by: Shan Levans  Exercises - Median Nerve Flossing  - 1 x daily - 7 x weekly - 1 sets - 10 reps - no hold - Ulnar Nerve Flossing  - 1 x daily - 7 x weekly - 1 sets - 10 reps - no hold - Doorway Pec Stretch at 90 Degrees Abduction  - 2-3 x daily - 7 x weekly - 1 sets - 3 reps - 20-30 seconds hold - Standing shoulder flexion wall slides  - 1-2 x daily - 7 x weekly - 1 sets - 5 reps - 20-30 seconds hold - Seated Cervical Sidebending Stretch  - 1-2 x daily - 7 x weekly - 1 sets - 3 reps - 20-30 seconds hold  ASSESSMENT: CLINICAL IMPRESSION: Continued with AA/ROM stretches with pulleys to focus on decreasing scapular compensation and added some A/ROM stretches that were challenging for pt due to tightness.  Then continued with manual therapy as pt continues with Lt upper quadrant tightness that does improve with MT. She has first trial of DN tomorrow.   OBJECTIVE IMPAIRMENTS decreased ROM, increased edema, increased fascial restrictions, impaired UE functional use, postural dysfunction, and pain.   ACTIVITY LIMITATIONS carrying, lifting, and reach over head  PARTICIPATION LIMITATIONS:  none  PERSONAL FACTORS  none  are also affecting patient's functional outcome.   REHAB POTENTIAL: Good  CLINICAL DECISION MAKING: Stable/uncomplicated  EVALUATION COMPLEXITY: Low  GOALS: Goals reviewed with patient? Yes  SHORT TERM GOALS=LONG TERM GOALS  Target date: 02/23/2023    Pt will be independent in HEP for continued mobility and strength  Baseline: Goal status: INITIAL  2.  Pt will report a 75% improvement in pain in the left axilla for improved comfort.  Baseline:  Goal status: INITIAL   PLAN: PT FREQUENCY: 2x/week  PT DURATION: 4 weeks  PLANNED INTERVENTIONS: Therapeutic exercises, Therapeutic activity, Patient/Family education, Self Care, Joint mobilization, Manual lymph drainage, Compression bandaging, scar mobilization, Taping, Vasopneumatic device, dry needling and Manual therapy  PLAN FOR NEXT SESSION: Cont Lt shoulder/upper quadrant STM/MFR, DN for UT, scapular/neck muscles as needed   Otelia Limes, PTA 02/03/2023, 11:06 AM

## 2023-02-04 ENCOUNTER — Ambulatory Visit: Payer: 59 | Admitting: Physical Therapy

## 2023-02-04 DIAGNOSIS — C50212 Malignant neoplasm of upper-inner quadrant of left female breast: Secondary | ICD-10-CM | POA: Diagnosis not present

## 2023-02-04 DIAGNOSIS — M25612 Stiffness of left shoulder, not elsewhere classified: Secondary | ICD-10-CM

## 2023-02-04 NOTE — Therapy (Signed)
OUTPATIENT PHYSICAL THERAPY ONCOLOGY TREATMENT  Patient Name: Maria Diaz MRN: [id number] DOB:Apr 27, 1971, 52 y.o., female Today's Date: 02/04/2023   PT End of Session - 02/04/23 0946     Visit Number 17    Number of Visits 22    Date for PT Re-Evaluation 02/23/23    Authorization Type no auth needed    PT Start Time 0932    PT Stop Time 1015    PT Time Calculation (min) 43 min    Activity Tolerance Patient tolerated treatment well              Past Medical History:  Diagnosis Date   Anemia    Anxiety    Depression    Glaucoma    History of radiation therapy    Left breast 05/28/22-07/06/22-Dr. Gery Pray   Hives    chronic, on Xolair injections   Past Surgical History:  Procedure Laterality Date   BREAST BIOPSY Left 01/19/2022   BREAST CYST EXCISION Left 01/28/2021   Procedure: LEFT BREAST MASS EXCISION;  Surgeon: Rolm Bookbinder, MD;  Location: [place];  Service: General;  Laterality: Left;  START TIME OF 3:00 PM FOR 60 MINUTES IN ROOM 8   BREAST LUMPECTOMY WITH RADIOACTIVE SEED AND SENTINEL LYMPH NODE BIOPSY Left 04/13/2022   Procedure: LEFT BREAST BRACKETED LUMPECTOMY WITH RADIOACTIVE SEED AND AXILLARY SENTINEL LYMPH NODE BIOPSY;  Surgeon: Rolm Bookbinder, MD;  Location: [place];  Service: General;  Laterality: Left;   HYSTEROSCOPY W/ ENDOMETRIAL ABLATION  08/14/2020   [facility]   MASTOPEXY Bilateral 04/21/2022   Procedure: MASTOPEXY;  Surgeon: Irene Limbo, MD;  Location: [place];  Service: Plastics;  Laterality: Bilateral;   Patient Active Problem List   Diagnosis Date Noted   Anemia 12/31/2021   Malignant neoplasm of upper-inner quadrant of left breast in female, estrogen receptor positive ([place]) 12/31/2021   Open angle with borderline findings and low glaucoma risk in both eyes 01/11/2016   Chronic idiopathic urticaria 01/11/2016   Age-related nuclear cataract of both eyes 01/11/2016   Eczema  01/11/2016    PCP: [name], MD  REFERRING PROVIDER: Dr. Chryl Heck  REFERRING DIAG: C50.212,Z17.0 (ICD-10-CM) - Malignant neoplasm of upper-inner quadrant of left breast in female, estrogen receptor positive ([place])  THERAPY DIAG:  Stiffness of left shoulder, not elsewhere classified  Malignant neoplasm of upper-inner quadrant of left breast in female, estrogen receptor positive ([place])  ONSET DATE: 12/09/21  Rationale for Evaluation and Treatment Rehabilitation  SUBJECTIVE  SUBJECTIVE STATEMENT: Doing OK. Primary complaint is left upper trap region pain. PERTINENT HISTORY:  Patient was diagnosed on 12/09/21 with left grade 3. It measures 3 cm and is located in the upper inner quadrant. 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 04/13/22- underwent L breast lumpectomy and SLNB (0/3) 04/21/22- bilateral mastopexy. Completed radiation in August.    PAIN: 6/10 in the axilla   PRECAUTIONS: Other: at risk of lymphedema  WEIGHT BEARING RESTRICTIONS No  FALLS:  Has patient fallen in last 6 months? 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  LEISURE: pt has been doing PT exercises 2x/wk and on the other days pt walks  HAND DOMINANCE : right   PRIOR LEVEL OF FUNCTION: Independent  PATIENT GOALS to avoid lymphedema, decrease pain   OBJECTIVE  COGNITION:  Overall cognitive status: Within functional limits for tasks assessed   PALPATION:   OBSERVATIONS / OTHER ASSESSMENTS:   POSTURE: forward head, rounded shoulders  UPPER EXTREMITY AROM/PROM:  A/PROM RIGHT   eval  01/26/23  Shoulder extension  62   Shoulder flexion 155   Shoulder abduction 177   Shoulder internal rotation 70 Behind the back to T8  Shoulder external rotation 88     (Blank rows = not tested)  A/PROM LEFT   eval 01/26/23  Shoulder extension 57 40 - a little tingling  Shoulder flexion 160 with p! 150 - with pain in the axilla   Shoulder abduction 125 150  Shoulder internal rotation 60 Behind the back to L5 with pain  Shoulder external rotation 90 55 - shoulder pain    (Blank rows = not tested)  UE MMT: 4+/5 without pain  Roos test:no tingling on the Rt side over 30 seconds, tingling immediately on the left side.   + ULTT median both positions and ulnar nerve  LYMPHEDEMA ASSESSMENTS:  SURGERY TYPE/DATE: 04/13/22 L lumpectomy and SLNB NUMBER OF LYMPH NODES REMOVED: 0/3 CHEMOTHERAPY: Completed RADIATION:completed HORMONE TREATMENT: tamoxifen - currently taking INFECTIONS: none  TODAY'S TREATMENT  02/04/23: Discussion of DN and use for treating myofascial pain Manual therapy: soft tissue mobilization to left cervical musculature Trigger Point Dry-Needling  Treatment instructions: Expect mild to moderate muscle soreness. S/S of pneumothorax if dry needled over a lung field, and to seek immediate medical attention should they occur. Patient verbalized understanding of these instructions and education.  Patient Consent Given: Yes Education handout provided: Yes Muscles treated: left cervical multifidi, left upper traps and left levator scap muscles Electrical stimulation performed: No Parameters: N/A Treatment response/outcome: improved soft tissue mobility and decreased tender point size and number  Therapeutic Exercises: Review of upper trap stretch and current HEP Instruction of levator scap stretch 3x 30 sec hold Wall open books 10x Left UE pectoral stretch on wall with head rotation right dynamically 10x     02/03/23: Therapeutic Exercises  Pulleys into flexion and abd x2 mins each with VC's to  decrease Lt scapular compensation though pt was some improved with this from last session Supine over foam roll for following: Bi UE abd and then bil UE scaption into a "V" x10 each returning therapist demo; then supine on mat for bil UE abd or "snow angel" x5, 5 sec holds Then Lt S/L for Rt UE open book stretch, then same on contralateral side 5x  each side Manual Therapy P/ROM of Lt shoulder into flexion, abd and D2 with scapular depression by therapist throughout and VC's for to relax due to muscle guarding STM with cocoa butter to Lt UT, pectoralis, axilla and lat insertion where pt palpably tight in supine and Rt S/L; also to inferior Lt breast iwht cocoa butter in supine where pt reports still feeling tightness with end range shoulder stretches.  Scap Mobs in Rt S/L to Lt scapula into retraction and protraction to help improve scap mobility that conts to limit her end P/ROM  01/28/23: Pulleys into flexion and abd x2 mins each with VC's to decrease Lt scapular compensation as pt is struggling with this due to muscle guarding Manual Therapy P/ROM of Lt shoulder into flexion, abd and D2 with scapular depression by therapist throughout and VC's for to relax due to muscle guarding STM with cocoa butter to Lt UT, pectoralis, axilla and lat insertion where pt palpably tight in supine and Rt S/L Scap Mobs in Rt S/L to Lt scapula into retraction and protraction to help improve scap mobility that is limiting to her end P/ROM  01/26/23 EVAL completed STM left UT, pectoralis, axillary borders, lat insertion with PROM  Updated HEP to include per below  PATIENT EDUCATION:  Education details: cording and importance of stretching to reduce cording, L breast lymphedema vs. Post op radiation edema, vs edema from cording;  DN education written instructions provided Person educated: Patient Education method: Explanation Education comprehension: verbalized understanding  HOME EXERCISE PROGRAM:  Access Code:  [email or web address] URL: [email or web address] Date: 02/04/2023 Prepared by: Ruben Im  Exercises - Median Nerve Flossing  - 1 x daily - 7 x weekly - 1 sets - 10 reps - no hold - Ulnar Nerve Flossing  - 1 x daily - 7 x weekly - 1 sets - 10 reps - no hold - Doorway Pec Stretch at 90 Degrees Abduction  - 2-3 x daily - 7 x weekly - 1 sets - 3 reps - 20-30 seconds hold - Standing shoulder flexion wall slides  - 1-2 x daily - 7 x weekly - 1 sets - 5 reps - 20-30 seconds hold - Seated Cervical Sidebending Stretch  - 1-2 x daily - 7 x weekly - 1 sets - 3 reps - 20-30 seconds hold - Seated Levator Scapulae Stretch  - 1-2 x daily - 7 x weekly - 1 sets - 3 reps - Standing Pec Stretch at Wall  - 1 x daily - 7 x weekly - 1 sets - 10 reps - Standing Thoracic Open Book at [place]  - 1 x daily - 7 x weekly - 1 sets - 10 reps  ASSESSMENT: CLINICAL IMPRESSION: The patient is receptive to trying DN to address multiple tender points in upper traps and levator scap muscles.  Good initial response with improved muscle length and soft tissue mobility noted.  The patient was encouraged in regular performance of HEP post DN lengthening  to enhance long term benefit. Therapist monitoring response to all interventions and modifying treatment accordingly.     OBJECTIVE IMPAIRMENTS decreased ROM, increased edema, increased fascial restrictions, impaired UE functional use, postural dysfunction, and pain.   ACTIVITY LIMITATIONS carrying, lifting, and reach over head  PARTICIPATION LIMITATIONS:  none  PERSONAL FACTORS  none  are also affecting patient's functional outcome.   REHAB POTENTIAL: Good  CLINICAL DECISION MAKING: Stable/uncomplicated  EVALUATION COMPLEXITY: Low  GOALS: Goals reviewed with patient? Yes  SHORT TERM GOALS=LONG TERM GOALS  Target  date: 02/23/2023    Pt will be independent in HEP for continued mobility and strength  Baseline: Goal status: INITIAL  2.  Pt will report a 75%  improvement in pain in the left axilla for improved comfort.  Baseline:  Goal status: INITIAL   PLAN: PT FREQUENCY: 2x/week  PT DURATION: 4 weeks  PLANNED INTERVENTIONS: Therapeutic exercises, Therapeutic activity, Patient/Family education, Self Care, Joint mobilization, Manual lymph drainage, Compression bandaging, scar mobilization, Taping, Vasopneumatic device, dry needling and Manual therapy  PLAN FOR NEXT SESSION: Cont Lt shoulder/upper quadrant STM/MFR, assess response to DN #1 for UT, neck muscles   Ruben Im, PT 02/04/23 10:11 AM Phone: [phone] Fax: [phone]

## 2023-02-04 NOTE — Patient Instructions (Signed)

## 2023-02-08 ENCOUNTER — Other Ambulatory Visit: Payer: Self-pay | Admitting: Psychiatry

## 2023-02-09 ENCOUNTER — Ambulatory Visit: Payer: 59 | Admitting: Physical Therapy

## 2023-02-09 ENCOUNTER — Telehealth: Payer: Self-pay

## 2023-02-09 DIAGNOSIS — Z170 Estrogen receptor positive status [ER+]: Secondary | ICD-10-CM

## 2023-02-09 DIAGNOSIS — C50212 Malignant neoplasm of upper-inner quadrant of left female breast: Secondary | ICD-10-CM | POA: Diagnosis not present

## 2023-02-09 DIAGNOSIS — M25612 Stiffness of left shoulder, not elsewhere classified: Secondary | ICD-10-CM

## 2023-02-09 NOTE — Therapy (Signed)
OUTPATIENT PHYSICAL THERAPY ONCOLOGY TREATMENT  Patient Name: Maria Diaz MRN: [id number] DOB:1971/07/06, 52 y.o., female Today's Date: 02/09/2023   PT End of Session - 02/09/23 1017     Visit Number 18    Number of Visits 22    Date for PT Re-Evaluation 02/23/23    Authorization Type no auth needed    PT Start Time 1017    PT Stop Time 1058    PT Time Calculation (min) 41 min    Activity Tolerance Patient tolerated treatment well              Past Medical History:  Diagnosis Date   Anemia    Anxiety    Depression    Glaucoma    History of radiation therapy    Left breast 05/28/22-07/06/22-Dr. Gery Pray   Hives    chronic, on Xolair injections   Past Surgical History:  Procedure Laterality Date   BREAST BIOPSY Left 01/19/2022   BREAST CYST EXCISION Left 01/28/2021   Procedure: LEFT BREAST MASS EXCISION;  Surgeon: Rolm Bookbinder, MD;  Location: [place];  Service: General;  Laterality: Left;  START TIME OF 3:00 PM FOR 60 MINUTES IN ROOM 8   BREAST LUMPECTOMY WITH RADIOACTIVE SEED AND SENTINEL LYMPH NODE BIOPSY Left 04/13/2022   Procedure: LEFT BREAST BRACKETED LUMPECTOMY WITH RADIOACTIVE SEED AND AXILLARY SENTINEL LYMPH NODE BIOPSY;  Surgeon: Rolm Bookbinder, MD;  Location: [place];  Service: General;  Laterality: Left;   HYSTEROSCOPY W/ ENDOMETRIAL ABLATION  08/14/2020   [facility]   MASTOPEXY Bilateral 04/21/2022   Procedure: MASTOPEXY;  Surgeon: Irene Limbo, MD;  Location: [place];  Service: Plastics;  Laterality: Bilateral;   Patient Active Problem List   Diagnosis Date Noted   Anemia 12/31/2021   Malignant neoplasm of upper-inner quadrant of left breast in female, estrogen receptor positive ([place]) 12/31/2021   Open angle with borderline findings and low glaucoma risk in both eyes 01/11/2016   Chronic idiopathic urticaria 01/11/2016   Age-related nuclear cataract of both eyes 01/11/2016   Eczema  01/11/2016    PCP: [name], MD  REFERRING PROVIDER: Dr. Chryl Heck  REFERRING DIAG: C50.212,Z17.0 (ICD-10-CM) - Malignant neoplasm of upper-inner quadrant of left breast in female, estrogen receptor positive ([place])  THERAPY DIAG:  Stiffness of left shoulder, not elsewhere classified  Malignant neoplasm of upper-inner quadrant of left breast in female, estrogen receptor positive ([place])  ONSET DATE: 12/09/21  Rationale for Evaluation and Treatment Rehabilitation  SUBJECTIVE  SUBJECTIVE STATEMENT: Minimal soreness following DN.  I felt less tingling afterwards, I could raise my hand in church Sunday without tingling.   But I feel it with reaching overhead today.    PERTINENT HISTORY:  Patient was diagnosed on 12/09/21 with left grade 3. It measures 3 cm and is located in the upper inner quadrant. 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 04/13/22- underwent L breast lumpectomy and SLNB (0/3) 04/21/22- bilateral mastopexy. Completed radiation in August.    PAIN: 5/10 in the left neck, Upper trap  PRECAUTIONS: Other: at risk of lymphedema  WEIGHT BEARING RESTRICTIONS No  FALLS:  Has patient fallen in last 6 months? 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  LEISURE: pt has been doing PT exercises 2x/wk and on the other days pt walks  HAND DOMINANCE : right   PRIOR LEVEL OF FUNCTION: Independent  PATIENT GOALS to avoid lymphedema, decrease pain   OBJECTIVE  COGNITION:  Overall cognitive status: Within functional limits for tasks assessed   PALPATION:   OBSERVATIONS / OTHER ASSESSMENTS:    POSTURE: forward head, rounded shoulders  UPPER EXTREMITY AROM/PROM:  A/PROM RIGHT   eval  01/26/23  Shoulder extension 62   Shoulder flexion 155   Shoulder abduction 177   Shoulder internal rotation 70 Behind the back to T8  Shoulder external rotation 88     (Blank rows = not tested)  A/PROM LEFT   eval 01/26/23  Shoulder extension 57 40 - a little tingling  Shoulder flexion 160 with p! 150 - with pain in the axilla   Shoulder abduction 125 150  Shoulder internal rotation 60 Behind the back to L5 with pain  Shoulder external rotation 90 55 - shoulder pain    (Blank rows = not tested)  UE MMT: 4+/5 without pain  Roos test:no tingling on the Rt side over 30 seconds, tingling immediately on the left side.   + ULTT median both positions and ulnar nerve  LYMPHEDEMA ASSESSMENTS:  SURGERY TYPE/DATE: 04/13/22 L lumpectomy and SLNB NUMBER OF LYMPH NODES REMOVED: 0/3 CHEMOTHERAPY: Completed RADIATION:completed HORMONE TREATMENT: tamoxifen - currently taking INFECTIONS: none   TODAY'S TREATMENT  3/19: Manual therapy: soft tissue mobilization to left cervical musculature Trigger Point Dry-Needling  Treatment instructions: Expect mild to moderate muscle soreness. S/S of pneumothorax if dry needled over a lung field, and to seek immediate medical attention should they occur. Patient verbalized understanding of these instructions and education.  Patient Consent Given: Yes Education handout provided: Yes Muscles treated: left cervical multifidi, left upper traps and left levator scap muscles Electrical stimulation performed: No Parameters: N/A Treatment response/outcome: improved soft tissue mobility and decreased tender point size and number  Therapeutic Exercises:  levator scap stretch 3x 30 sec hold Back to Wall bil UE elevation 10x Median nerve floss left 8x Ulnar nerve floss left 8x Radial nerve floss left 8x     02/04/23: Discussion of DN and use for treating  myofascial pain Manual therapy: soft tissue mobilization to left cervical musculature Trigger Point Dry-Needling  Treatment instructions: Expect mild to moderate muscle soreness. S/S of pneumothorax if dry needled over a lung field, and to seek immediate medical attention should they occur. Patient verbalized understanding of these instructions and education.  Patient Consent Given: Yes Education handout provided: Yes  Muscles treated: left cervical multifidi, left upper traps and left levator scap muscles Electrical stimulation performed: No Parameters: N/A Treatment response/outcome: improved soft tissue mobility and decreased tender point size and number  Therapeutic Exercises: Review of upper trap stretch and current HEP Instruction of levator scap stretch 3x 30 sec hold Wall open books 10x Left UE pectoral stretch on wall with head rotation right dynamically 10x     02/03/23: Therapeutic Exercises  Pulleys into flexion and abd x2 mins each with VC's to decrease Lt scapular compensation though pt was some improved with this from last session Supine over foam roll for following: Bi UE abd and then bil UE scaption into a "V" x10 each returning therapist demo; then supine on mat for bil UE abd or "snow angel" x5, 5 sec holds Then Lt S/L for Rt UE open book stretch, then same on contralateral side 5x each side Manual Therapy P/ROM of Lt shoulder into flexion, abd and D2 with scapular depression by therapist throughout and VC's for to relax due to muscle guarding STM with cocoa butter to Lt UT, pectoralis, axilla and lat insertion where pt palpably tight in supine and Rt S/L; also to inferior Lt breast iwht cocoa butter in supine where pt reports still feeling tightness with end range shoulder stretches.  Scap Mobs in Rt S/L to Lt scapula into retraction and protraction to help improve scap mobility that conts to limit her end P/ROM  01/28/23: Pulleys into flexion and abd x2 mins each with  VC's to decrease Lt scapular compensation as pt is struggling with this due to muscle guarding Manual Therapy P/ROM of Lt shoulder into flexion, abd and D2 with scapular depression by therapist throughout and VC's for to relax due to muscle guarding STM with cocoa butter to Lt UT, pectoralis, axilla and lat insertion where pt palpably tight in supine and Rt S/L Scap Mobs in Rt S/L to Lt scapula into retraction and protraction to help improve scap mobility that is limiting to her end P/ROM  01/26/23 EVAL completed STM left UT, pectoralis, axillary borders, lat insertion with PROM  Updated HEP to include per below  PATIENT EDUCATION:  Education details: cording and importance of stretching to reduce cording, L breast lymphedema vs. Post op radiation edema, vs edema from cording;  DN education written instructions provided Person educated: Patient Education method: Explanation Education comprehension: verbalized understanding  HOME EXERCISE PROGRAM: Access Code: [email or web address] URL: [email or web address] Date: 02/09/2023 Prepared by: Ruben Im  Exercises - Median Nerve Flossing  - 1 x daily - 7 x weekly - 1 sets - 10 reps - no hold - Ulnar Nerve Flossing  - 1 x daily - 7 x weekly - 1 sets - 10 reps - no hold - Doorway Pec Stretch at 90 Degrees Abduction  - 2-3 x daily - 7 x weekly - 1 sets - 3 reps - 20-30 seconds hold - Standing shoulder flexion wall slides  - 1-2 x daily - 7 x weekly - 1 sets - 5 reps - 20-30 seconds hold - Seated Cervical Sidebending Stretch  - 1-2 x daily - 7 x weekly - 1 sets - 3 reps - 20-30 seconds hold - Seated Levator Scapulae Stretch  - 1-2 x daily - 7 x weekly - 1 sets - 3 reps - Standing Pec Stretch at Wall  - 1 x daily - 7 x weekly - 1 sets - 10 reps - Standing Thoracic Open Book at [place]  - 1 x daily -  7 x weekly - 1 sets - 10 reps - Radial Nerve Flossing  - 1 x daily - 7 x weekly - 1 sets - 10 reps  Access Code: [email or web address] URL:  [email or web address] Date: 02/04/2023 Prepared by: Ruben Im  Exercises - Median Nerve Flossing  - 1 x daily - 7 x weekly - 1 sets - 10 reps - no hold - Ulnar Nerve Flossing  - 1 x daily - 7 x weekly - 1 sets - 10 reps - no hold - Doorway Pec Stretch at 90 Degrees Abduction  - 2-3 x daily - 7 x weekly - 1 sets - 3 reps - 20-30 seconds hold - Standing shoulder flexion wall slides  - 1-2 x daily - 7 x weekly - 1 sets - 5 reps - 20-30 seconds hold - Seated Cervical Sidebending Stretch  - 1-2 x daily - 7 x weekly - 1 sets - 3 reps - 20-30 seconds hold - Seated Levator Scapulae Stretch  - 1-2 x daily - 7 x weekly - 1 sets - 3 reps - Standing Pec Stretch at Wall  - 1 x daily - 7 x weekly - 1 sets - 10 reps - Standing Thoracic Open Book at [place]  - 1 x daily - 7 x weekly - 1 sets - 10 reps  ASSESSMENT: CLINICAL IMPRESSION: Good response to DN with reduction in symptoms for a few days after session.  She is open to further DN and manual therapy to address myofascial restrictions which may contribute to pain, numbness/tingling.  Neural mobility ex's do produce mild tingling but symptoms resolve quickly after stopping the exercise.  Therapist monitoring response to all interventions and modifying treatment accordingly.     OBJECTIVE IMPAIRMENTS decreased ROM, increased edema, increased fascial restrictions, impaired UE functional use, postural dysfunction, and pain.   ACTIVITY LIMITATIONS carrying, lifting, and reach over head  PARTICIPATION LIMITATIONS:  none  PERSONAL FACTORS  none  are also affecting patient's functional outcome.   REHAB POTENTIAL: Good  CLINICAL DECISION MAKING: Stable/uncomplicated  EVALUATION COMPLEXITY: Low  GOALS: Goals reviewed with patient? Yes  SHORT TERM GOALS=LONG TERM GOALS  Target date: 02/23/2023    Pt will be independent in HEP for continued mobility and strength  Baseline: Goal status: INITIAL  2.  Pt will report a 75% improvement  in pain in the left axilla for improved comfort.  Baseline:  Goal status: INITIAL   PLAN: PT FREQUENCY: 2x/week  PT DURATION: 4 weeks  PLANNED INTERVENTIONS: Therapeutic exercises, Therapeutic activity, Patient/Family education, Self Care, Joint mobilization, Manual lymph drainage, Compression bandaging, scar mobilization, Taping, Vasopneumatic device, dry needling and Manual therapy  PLAN FOR NEXT SESSION: Cont Lt shoulder/upper quadrant STM/MFR, assess response to DN #2 for UT, neck muscles  Ruben Im, PT 02/09/23 8:22 PM Phone: [phone] Fax: [phone]

## 2023-02-09 NOTE — Telephone Encounter (Signed)
form was copied and put in scan basket

## 2023-02-09 NOTE — Telephone Encounter (Signed)
The paperwork has been completed and was handed over to the nurse for faxing.

## 2023-02-09 NOTE — Telephone Encounter (Signed)
faxed and confirmed (urgent) form from [facility] disability

## 2023-02-09 NOTE — Telephone Encounter (Signed)
emailed patient a copy of the [facility] forms that were completed.

## 2023-02-10 NOTE — Telephone Encounter (Signed)
paperwork was faxed and confirmed yesterday and it was also put in the scan basket

## 2023-02-11 ENCOUNTER — Ambulatory Visit: Payer: 59

## 2023-02-11 DIAGNOSIS — M25612 Stiffness of left shoulder, not elsewhere classified: Secondary | ICD-10-CM

## 2023-02-11 DIAGNOSIS — Z170 Estrogen receptor positive status [ER+]: Secondary | ICD-10-CM

## 2023-02-11 DIAGNOSIS — L599 Disorder of the skin and subcutaneous tissue related to radiation, unspecified: Secondary | ICD-10-CM

## 2023-02-11 DIAGNOSIS — Z483 Aftercare following surgery for neoplasm: Secondary | ICD-10-CM

## 2023-02-11 DIAGNOSIS — C50212 Malignant neoplasm of upper-inner quadrant of left female breast: Secondary | ICD-10-CM | POA: Diagnosis not present

## 2023-02-11 NOTE — Therapy (Signed)
OUTPATIENT PHYSICAL THERAPY ONCOLOGY TREATMENT  Patient Name: Maria Diaz MRN: [id number] DOB:29-Mar-1971, 52 y.o., female Today's Date: 02/11/2023   PT End of Session - 02/11/23 1108     Visit Number 19    Number of Visits 22    Date for PT Re-Evaluation 02/23/23    PT Start Time 1106    PT Stop Time 1202    PT Time Calculation (min) 56 min    Activity Tolerance Patient tolerated treatment well    Behavior During Therapy [facility] for tasks assessed/performed              Past Medical History:  Diagnosis Date   Anemia    Anxiety    Depression    Glaucoma    History of radiation therapy    Left breast 05/28/22-07/06/22-Dr. Gery Pray   Hives    chronic, on Xolair injections   Past Surgical History:  Procedure Laterality Date   BREAST BIOPSY Left 01/19/2022   BREAST CYST EXCISION Left 01/28/2021   Procedure: LEFT BREAST MASS EXCISION;  Surgeon: Rolm Bookbinder, MD;  Location: [place];  Service: General;  Laterality: Left;  START TIME OF 3:00 PM FOR 60 MINUTES IN ROOM 8   BREAST LUMPECTOMY WITH RADIOACTIVE SEED AND SENTINEL LYMPH NODE BIOPSY Left 04/13/2022   Procedure: LEFT BREAST BRACKETED LUMPECTOMY WITH RADIOACTIVE SEED AND AXILLARY SENTINEL LYMPH NODE BIOPSY;  Surgeon: Rolm Bookbinder, MD;  Location: [place];  Service: General;  Laterality: Left;   HYSTEROSCOPY W/ ENDOMETRIAL ABLATION  08/14/2020   [facility]   MASTOPEXY Bilateral 04/21/2022   Procedure: MASTOPEXY;  Surgeon: Irene Limbo, MD;  Location: [place];  Service: Plastics;  Laterality: Bilateral;   Patient Active Problem List   Diagnosis Date Noted   Anemia 12/31/2021   Malignant neoplasm of upper-inner quadrant of left breast in female, estrogen receptor positive ([place]) 12/31/2021   Open angle with borderline findings and low glaucoma risk in both eyes 01/11/2016   Chronic idiopathic urticaria 01/11/2016   Age-related nuclear cataract of both eyes  01/11/2016   Eczema 01/11/2016    PCP: [name], MD  REFERRING PROVIDER: Dr. Chryl Heck  REFERRING DIAG: C50.212,Z17.0 (ICD-10-CM) - Malignant neoplasm of upper-inner quadrant of left breast in female, estrogen receptor positive ([place])  THERAPY DIAG:  Stiffness of left shoulder, not elsewhere classified  Malignant neoplasm of upper-inner quadrant of left breast in female, estrogen receptor positive ([place])  Aftercare following surgery for neoplasm  Disorder of the skin and subcutaneous tissue related to radiation, unspecified  ONSET DATE: 12/09/21  Rationale for Evaluation and Treatment Rehabilitation  SUBJECTIVE  SUBJECTIVE STATEMENT: The DN helped me more than I thought it would. I do have a crick in my neck this morning but I think I slept wrong. The tingling in my Lt hand is much improved now so that's good.    PERTINENT HISTORY:  Patient was diagnosed on 12/09/21 with left grade 3. It measures 3 cm and is located in the upper inner quadrant. 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 04/13/22- underwent L breast lumpectomy and SLNB (0/3) 04/21/22- bilateral mastopexy. Completed radiation in August.    PAIN: 3/10 in the right neck, feels like a crick in my neck. I think I just slept wrong  PRECAUTIONS: Other: at risk of lymphedema  WEIGHT BEARING RESTRICTIONS No  FALLS:  Has patient fallen in last 6 months? 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  LEISURE: pt has been doing PT exercises 2x/wk and on the other days pt walks  HAND DOMINANCE : right   PRIOR LEVEL OF FUNCTION:  Independent  PATIENT GOALS to avoid lymphedema, decrease pain   OBJECTIVE  COGNITION:  Overall cognitive status: Within functional limits for tasks assessed   PALPATION:   OBSERVATIONS / OTHER ASSESSMENTS:   POSTURE: forward head, rounded shoulders  UPPER EXTREMITY AROM/PROM:  A/PROM RIGHT   eval  01/26/23  Shoulder extension 62   Shoulder flexion 155   Shoulder abduction 177   Shoulder internal rotation 70 Behind the back to T8  Shoulder external rotation 88     (Blank rows = not tested)  A/PROM LEFT   eval 01/26/23  Shoulder extension 57 40 - a little tingling  Shoulder flexion 160 with p! 150 - with pain in the axilla   Shoulder abduction 125 150  Shoulder internal rotation 60 Behind the back to L5 with pain  Shoulder external rotation 90 55 - shoulder pain    (Blank rows = not tested)  UE MMT: 4+/5 without pain  Roos test:no tingling on the Rt side over 30 seconds, tingling immediately on the left side.   + ULTT median both positions and ulnar nerve  LYMPHEDEMA ASSESSMENTS:  SURGERY TYPE/DATE: 04/13/22 L lumpectomy and SLNB NUMBER OF LYMPH NODES REMOVED: 0/3 CHEMOTHERAPY: Completed RADIATION:completed HORMONE TREATMENT: tamoxifen - currently taking INFECTIONS: none   TODAY'S TREATMENT  02/11/23: Therapeutic Exercises  Pulleys into flexion and abd x2 mins each with VC's to decrease Lt scapular compensation though pt was some improved with this from last session Roll yellow ball up wall into flexion x 10 and Lt abd x 10 each Supine over foam roll for following: Bil UE abd and then bil UE scaption into a "V" x10 each returning therapist demo; then supine on mat for bil UE abd or "snow angel" x5, 5 sec holds Then Rt S/L for Lt UE open book stretch with Lt knee on foam roll Manual Therapy P/ROM of Lt shoulder into flexion, abd and D2 with scapular depression by therapist throughout, pt much more relaxed now and with improved end P/ROM STM to Lt axilla and lat  insertion where pt palpably tight in supine but also much improved Scap Mobs in Rt S/L to Lt scapula into retraction and protraction to help improve scap mobility, this was much improved today  3/19: Manual therapy: soft tissue mobilization to left cervical musculature Trigger  Point Dry-Needling  Treatment instructions: Expect mild to moderate muscle soreness. S/S of pneumothorax if dry needled over a lung field, and to seek immediate medical attention should they occur. Patient verbalized understanding of these instructions and education.  Patient Consent Given: Yes Education handout provided: Yes Muscles treated: left cervical multifidi, left upper traps and left levator scap muscles Electrical stimulation performed: No Parameters: N/A Treatment response/outcome: improved soft tissue mobility and decreased tender point size and number  Therapeutic Exercises:  levator scap stretch 3x 30 sec hold Back to Wall bil UE elevation 10x Median nerve floss left 8x Ulnar nerve floss left 8x Radial nerve floss left 8x     02/04/23: Discussion of DN and use for treating myofascial pain Manual therapy: soft tissue mobilization to left cervical musculature Trigger Point Dry-Needling  Treatment instructions: Expect mild to moderate muscle soreness. S/S of pneumothorax if dry needled over a lung field, and to seek immediate medical attention should they occur. Patient verbalized understanding of these instructions and education.  Patient Consent Given: Yes Education handout provided: Yes Muscles treated: left cervical multifidi, left upper traps and left levator scap muscles Electrical stimulation performed: No Parameters: N/A Treatment response/outcome: improved soft tissue mobility and decreased tender point size and number  Therapeutic Exercises: Review of upper trap stretch and current HEP Instruction of levator scap stretch 3x 30 sec hold Wall open books 10x Left UE pectoral stretch on  wall with head rotation right dynamically 10x     PATIENT EDUCATION:  Education details: cording and importance of stretching to reduce cording, L breast lymphedema vs. Post op radiation edema, vs edema from cording;  DN education written instructions provided Person educated: Patient Education method: Explanation Education comprehension: verbalized understanding  HOME EXERCISE PROGRAM: Access Code: [email or web address] URL: [email or web address] Date: 02/09/2023 Prepared by: Ruben Im  Exercises - Median Nerve Flossing  - 1 x daily - 7 x weekly - 1 sets - 10 reps - no hold - Ulnar Nerve Flossing  - 1 x daily - 7 x weekly - 1 sets - 10 reps - no hold - Doorway Pec Stretch at 90 Degrees Abduction  - 2-3 x daily - 7 x weekly - 1 sets - 3 reps - 20-30 seconds hold - Standing shoulder flexion wall slides  - 1-2 x daily - 7 x weekly - 1 sets - 5 reps - 20-30 seconds hold - Seated Cervical Sidebending Stretch  - 1-2 x daily - 7 x weekly - 1 sets - 3 reps - 20-30 seconds hold - Seated Levator Scapulae Stretch  - 1-2 x daily - 7 x weekly - 1 sets - 3 reps - Standing Pec Stretch at Wall  - 1 x daily - 7 x weekly - 1 sets - 10 reps - Standing Thoracic Open Book at [place]  - 1 x daily - 7 x weekly - 1 sets - 10 reps - Radial Nerve Flossing  - 1 x daily - 7 x weekly - 1 sets - 10 reps  Access Code: [email or web address] URL: [email or web address] Date: 02/04/2023 Prepared by: Ruben Im  Exercises - Median Nerve Flossing  - 1 x daily - 7 x weekly - 1 sets - 10 reps - no hold - Ulnar Nerve Flossing  - 1 x daily - 7 x weekly - 1 sets - 10 reps - no hold - Doorway Pec Stretch at 90 Degrees Abduction  - 2-3 x daily - 7 x weekly - 1 sets - 3 reps - 20-30 seconds  hold - Standing shoulder flexion wall slides  - 1-2 x daily - 7 x weekly - 1 sets - 5 reps - 20-30 seconds hold - Seated Cervical Sidebending Stretch  - 1-2 x daily - 7 x weekly - 1 sets - 3 reps - 20-30 seconds hold - Seated  Levator Scapulae Stretch  - 1-2 x daily - 7 x weekly - 1 sets - 3 reps - Standing Pec Stretch at Wall  - 1 x daily - 7 x weekly - 1 sets - 10 reps - Standing Thoracic Open Book at [place]  - 1 x daily - 7 x weekly - 1 sets - 10 reps  ASSESSMENT: CLINICAL IMPRESSION: Pt returns after 2 sessions of DN reporting good response. Her Lt UE tingling is much improved and her A/ROM has improved as well with stretches today. She also reports the Rt sided crick in her neck was improved after A/ROM stretches. Also continued with manual therapy working to decrease her Lt upper quadrant tightness and improve Lt UE mobility.  Good gains noted overall with improved Lt shoulder and scapula mobility as well as decrease tissue tightness at Lt axilla.     OBJECTIVE IMPAIRMENTS decreased ROM, increased edema, increased fascial restrictions, impaired UE functional use, postural dysfunction, and pain.   ACTIVITY LIMITATIONS carrying, lifting, and reach over head  PARTICIPATION LIMITATIONS:  none  PERSONAL FACTORS  none  are also affecting patient's functional outcome.   REHAB POTENTIAL: Good  CLINICAL DECISION MAKING: Stable/uncomplicated  EVALUATION COMPLEXITY: Low  GOALS: Goals reviewed with patient? Yes  SHORT TERM GOALS=LONG TERM GOALS  Target date: 02/23/2023    Pt will be independent in HEP for continued mobility and strength  Baseline: Goal status: INITIAL  2.  Pt will report a 75% improvement in pain in the left axilla for improved comfort.  Baseline:  Goal status: INITIAL   PLAN: PT FREQUENCY: 2x/week  PT DURATION: 4 weeks  PLANNED INTERVENTIONS: Therapeutic exercises, Therapeutic activity, Patient/Family education, Self Care, Joint mobilization, Manual lymph drainage, Compression bandaging, scar mobilization, Taping, Vasopneumatic device, dry needling and Manual therapy  PLAN FOR NEXT SESSION: Cont Lt shoulder/upper quadrant STM/MFR, assess response to DN #2 for UT, neck  muscles  Collie Siad, PTA 02/11/23 12:14 PM

## 2023-02-15 ENCOUNTER — Ambulatory Visit: Payer: 59

## 2023-02-15 DIAGNOSIS — M25612 Stiffness of left shoulder, not elsewhere classified: Secondary | ICD-10-CM

## 2023-02-15 DIAGNOSIS — Z483 Aftercare following surgery for neoplasm: Secondary | ICD-10-CM

## 2023-02-15 DIAGNOSIS — L599 Disorder of the skin and subcutaneous tissue related to radiation, unspecified: Secondary | ICD-10-CM

## 2023-02-15 DIAGNOSIS — C50212 Malignant neoplasm of upper-inner quadrant of left female breast: Secondary | ICD-10-CM | POA: Diagnosis not present

## 2023-02-15 DIAGNOSIS — Z170 Estrogen receptor positive status [ER+]: Secondary | ICD-10-CM

## 2023-02-15 NOTE — Therapy (Signed)
OUTPATIENT PHYSICAL THERAPY ONCOLOGY TREATMENT  Patient Name: Maria Diaz MRN: [id number] DOB:January 11, 1971, 52 y.o., female Today's Date: 02/15/2023   PT End of Session - 02/15/23 1101     Visit Number 20    Number of Visits 22    Date for PT Re-Evaluation 02/23/23    PT Start Time 1100    PT Stop Time 1157    PT Time Calculation (min) 57 min    Activity Tolerance Patient tolerated treatment well    Behavior During Therapy [facility] for tasks assessed/performed              Past Medical History:  Diagnosis Date   Anemia    Anxiety    Depression    Glaucoma    History of radiation therapy    Left breast 05/28/22-07/06/22-Dr. Gery Pray   Hives    chronic, on Xolair injections   Past Surgical History:  Procedure Laterality Date   BREAST BIOPSY Left 01/19/2022   BREAST CYST EXCISION Left 01/28/2021   Procedure: LEFT BREAST MASS EXCISION;  Surgeon: Rolm Bookbinder, MD;  Location: [place];  Service: General;  Laterality: Left;  START TIME OF 3:00 PM FOR 60 MINUTES IN ROOM 8   BREAST LUMPECTOMY WITH RADIOACTIVE SEED AND SENTINEL LYMPH NODE BIOPSY Left 04/13/2022   Procedure: LEFT BREAST BRACKETED LUMPECTOMY WITH RADIOACTIVE SEED AND AXILLARY SENTINEL LYMPH NODE BIOPSY;  Surgeon: Rolm Bookbinder, MD;  Location: [place];  Service: General;  Laterality: Left;   HYSTEROSCOPY W/ ENDOMETRIAL ABLATION  08/14/2020   [facility]   MASTOPEXY Bilateral 04/21/2022   Procedure: MASTOPEXY;  Surgeon: Irene Limbo, MD;  Location: [place];  Service: Plastics;  Laterality: Bilateral;   Patient Active Problem List   Diagnosis Date Noted   Anemia 12/31/2021   Malignant neoplasm of upper-inner quadrant of left breast in female, estrogen receptor positive ([place]) 12/31/2021   Open angle with borderline findings and low glaucoma risk in both eyes 01/11/2016   Chronic idiopathic urticaria 01/11/2016   Age-related nuclear cataract of both eyes  01/11/2016   Eczema 01/11/2016    PCP: [name], MD  REFERRING PROVIDER: Dr. Chryl Heck  REFERRING DIAG: C50.212,Z17.0 (ICD-10-CM) - Malignant neoplasm of upper-inner quadrant of left breast in female, estrogen receptor positive ([place])  THERAPY DIAG:  Stiffness of left shoulder, not elsewhere classified  Malignant neoplasm of upper-inner quadrant of left breast in female, estrogen receptor positive ([place])  Aftercare following surgery for neoplasm  Disorder of the skin and subcutaneous tissue related to radiation, unspecified  ONSET DATE: 12/09/21  Rationale for Evaluation and Treatment Rehabilitation  SUBJECTIVE  SUBJECTIVE STATEMENT: I'm feeling pretty good today. The DN has really helped improve my Lt UE tightness.    PERTINENT HISTORY:  Patient was diagnosed on 12/09/21 with left grade 3. It measures 3 cm and is located in the upper inner quadrant. 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 04/13/22- underwent L breast lumpectomy and SLNB (0/3) 04/21/22- bilateral mastopexy. Completed radiation in August.    PAIN: 3/10 in the right neck, feels like a crick in my neck. I think I just slept wrong  PRECAUTIONS: Other: at risk of lymphedema  WEIGHT BEARING RESTRICTIONS No  FALLS:  Has patient fallen in last 6 months? 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  LEISURE: pt has been doing PT exercises 2x/wk and on the other days pt walks  HAND DOMINANCE : right   PRIOR LEVEL OF FUNCTION: Independent  PATIENT GOALS to avoid lymphedema, decrease pain   OBJECTIVE  COGNITION:  Overall  cognitive status: Within functional limits for tasks assessed   PALPATION:   OBSERVATIONS / OTHER ASSESSMENTS:   POSTURE: forward head, rounded shoulders  UPPER EXTREMITY AROM/PROM:  A/PROM RIGHT   eval  01/26/23  Shoulder extension 62   Shoulder flexion 155   Shoulder abduction 177   Shoulder internal rotation 70 Behind the back to T8  Shoulder external rotation 88     (Blank rows = not tested)  A/PROM LEFT   eval 01/26/23  Shoulder extension 57 40 - a little tingling  Shoulder flexion 160 with p! 150 - with pain in the axilla   Shoulder abduction 125 150  Shoulder internal rotation 60 Behind the back to L5 with pain  Shoulder external rotation 90 55 - shoulder pain    (Blank rows = not tested)  UE MMT: 4+/5 without pain  Roos test:no tingling on the Rt side over 30 seconds, tingling immediately on the left side.   + ULTT median both positions and ulnar nerve  LYMPHEDEMA ASSESSMENTS:  SURGERY TYPE/DATE: 04/13/22 L lumpectomy and SLNB NUMBER OF LYMPH NODES REMOVED: 0/3 CHEMOTHERAPY: Completed RADIATION:completed HORMONE TREATMENT: tamoxifen - currently taking INFECTIONS: none   TODAY'S TREATMENT  02/15/23: Therapeutic Exercises  Pulleys into flexion and abd x2 mins each with VC's to decrease Lt scapular compensation though pt was some improved with this from last session Roll yellow ball up wall into flexion x 10 and Lt abd x 10 each Supine over foam roll for following: Bil UE abd and then bil UE scaption into a "V" x10 each returning therapist demo; then still on foam roll for bil UE abd or "snow angel" x5, 5 sec holds Then Rt S/L for Lt UE open book stretch with Lt knee on foam roll 7x, 5 sec holds Manual Therapy P/ROM of Lt shoulder into flexion, abd and D2 with scapular depression by therapist throughout, pt much more relaxed now and with improved end P/ROM STM to Lt axilla and lat insertion where pt palpably tight in supine but also much improved Scap Mobs in Rt  S/L to Lt scapula into retraction and protraction to help improve scap mobility, this was much improved today  02/11/23: Therapeutic Exercises  Pulleys into flexion and abd x2 mins each with VC's to decrease Lt scapular compensation though pt was some improved with this from last session Roll  yellow ball up wall into flexion x 10 and Lt abd x 10 each Supine over foam roll for following: Bil UE abd and then bil UE scaption into a "V" x10 each returning therapist demo; then supine on mat for bil UE abd or "snow angel" x5, 5 sec holds Then Rt S/L for Lt UE open book stretch with Lt knee on foam roll Manual Therapy P/ROM of Lt shoulder into flexion, abd and D2 with scapular depression by therapist throughout, pt much more relaxed now and with improved end P/ROM STM to Lt axilla and lat insertion where pt palpably tight in supine but also much improved Scap Mobs in Rt S/L to Lt scapula into retraction and protraction to help improve scap mobility, this was much improved today  3/19: Manual therapy: soft tissue mobilization to left cervical musculature Trigger Point Dry-Needling  Treatment instructions: Expect mild to moderate muscle soreness. S/S of pneumothorax if dry needled over a lung field, and to seek immediate medical attention should they occur. Patient verbalized understanding of these instructions and education.  Patient Consent Given: Yes Education handout provided: Yes Muscles treated: left cervical multifidi, left upper traps and left levator scap muscles Electrical stimulation performed: No Parameters: N/A Treatment response/outcome: improved soft tissue mobility and decreased tender point size and number  Therapeutic Exercises:  levator scap stretch 3x 30 sec hold Back to Wall bil UE elevation 10x Median nerve floss left 8x Ulnar nerve floss left 8x Radial nerve floss left 8x     02/04/23: Discussion of DN and use for treating myofascial pain Manual therapy: soft tissue  mobilization to left cervical musculature Trigger Point Dry-Needling  Treatment instructions: Expect mild to moderate muscle soreness. S/S of pneumothorax if dry needled over a lung field, and to seek immediate medical attention should they occur. Patient verbalized understanding of these instructions and education.  Patient Consent Given: Yes Education handout provided: Yes Muscles treated: left cervical multifidi, left upper traps and left levator scap muscles Electrical stimulation performed: No Parameters: N/A Treatment response/outcome: improved soft tissue mobility and decreased tender point size and number  Therapeutic Exercises: Review of upper trap stretch and current HEP Instruction of levator scap stretch 3x 30 sec hold Wall open books 10x Left UE pectoral stretch on wall with head rotation right dynamically 10x     PATIENT EDUCATION:  Education details: cording and importance of stretching to reduce cording, L breast lymphedema vs. Post op radiation edema, vs edema from cording;  DN education written instructions provided Person educated: Patient Education method: Explanation Education comprehension: verbalized understanding  HOME EXERCISE PROGRAM: Access Code: [email or web address] URL: [email or web address] Date: 02/09/2023 Prepared by: Ruben Im  Exercises - Median Nerve Flossing  - 1 x daily - 7 x weekly - 1 sets - 10 reps - no hold - Ulnar Nerve Flossing  - 1 x daily - 7 x weekly - 1 sets - 10 reps - no hold - Doorway Pec Stretch at 90 Degrees Abduction  - 2-3 x daily - 7 x weekly - 1 sets - 3 reps - 20-30 seconds hold - Standing shoulder flexion wall slides  - 1-2 x daily - 7 x weekly - 1 sets - 5 reps - 20-30 seconds hold - Seated Cervical Sidebending Stretch  - 1-2 x daily - 7 x weekly - 1 sets - 3 reps - 20-30 seconds hold - Seated Levator Scapulae Stretch  - 1-2 x daily - 7 x weekly - 1 sets - 3 reps - Standing  Pec Stretch at [employer]  - 1 x daily - 7 x  weekly - 1 sets - 10 reps - Standing Thoracic Open Book at [place]  - 1 x daily - 7 x weekly - 1 sets - 10 reps - Radial Nerve Flossing  - 1 x daily - 7 x weekly - 1 sets - 10 reps  Access Code: [email or web address] URL: [email or web address] Date: 02/04/2023 Prepared by: Ruben Im  Exercises - Median Nerve Flossing  - 1 x daily - 7 x weekly - 1 sets - 10 reps - no hold - Ulnar Nerve Flossing  - 1 x daily - 7 x weekly - 1 sets - 10 reps - no hold - Doorway Pec Stretch at 90 Degrees Abduction  - 2-3 x daily - 7 x weekly - 1 sets - 3 reps - 20-30 seconds hold - Standing shoulder flexion wall slides  - 1-2 x daily - 7 x weekly - 1 sets - 5 reps - 20-30 seconds hold - Seated Cervical Sidebending Stretch  - 1-2 x daily - 7 x weekly - 1 sets - 3 reps - 20-30 seconds hold - Seated Levator Scapulae Stretch  - 1-2 x daily - 7 x weekly - 1 sets - 3 reps - Standing Pec Stretch at Wall  - 1 x daily - 7 x weekly - 1 sets - 10 reps - Standing Thoracic Open Book at [place]  - 1 x daily - 7 x weekly - 1 sets - 10 reps  ASSESSMENT: CLINICAL IMPRESSION: Continued with Lt UE flexibility and manual therapy working to decrease UE tightness.    OBJECTIVE IMPAIRMENTS decreased ROM, increased edema, increased fascial restrictions, impaired UE functional use, postural dysfunction, and pain.   ACTIVITY LIMITATIONS carrying, lifting, and reach over head  PARTICIPATION LIMITATIONS:  none  PERSONAL FACTORS  none  are also affecting patient's functional outcome.   REHAB POTENTIAL: Good  CLINICAL DECISION MAKING: Stable/uncomplicated  EVALUATION COMPLEXITY: Low  GOALS: Goals reviewed with patient? Yes  SHORT TERM GOALS=LONG TERM GOALS  Target date: 02/23/2023    Pt will be independent in HEP for continued mobility and strength  Baseline: Goal status: INITIAL  2.  Pt will report a 75% improvement in pain in the left axilla for improved comfort.  Baseline:  Goal status: INITIAL   PLAN: PT  FREQUENCY: 2x/week  PT DURATION: 4 weeks  PLANNED INTERVENTIONS: Therapeutic exercises, Therapeutic activity, Patient/Family education, Self Care, Joint mobilization, Manual lymph drainage, Compression bandaging, scar mobilization, Taping, Vasopneumatic device, dry needling and Manual therapy  PLAN FOR NEXT SESSION: Cont Lt shoulder/upper quadrant STM/MFR, assess response to DN #2 for UT, neck muscles  Collie Siad, PTA 02/15/23 1:09 PM

## 2023-02-17 ENCOUNTER — Ambulatory Visit: Payer: 59 | Admitting: Physical Therapy

## 2023-02-17 DIAGNOSIS — C50212 Malignant neoplasm of upper-inner quadrant of left female breast: Secondary | ICD-10-CM | POA: Diagnosis not present

## 2023-02-17 DIAGNOSIS — M25612 Stiffness of left shoulder, not elsewhere classified: Secondary | ICD-10-CM

## 2023-02-17 DIAGNOSIS — Z170 Estrogen receptor positive status [ER+]: Secondary | ICD-10-CM

## 2023-02-17 NOTE — Therapy (Signed)
OUTPATIENT PHYSICAL THERAPY ONCOLOGY TREATMENT  Patient Name: Maria Diaz MRN: [id number] DOB:August 21, 1971, 52 y.o., female Today's Date: 02/17/2023   PT End of Session - 02/17/23 0936     Visit Number 21    Date for PT Re-Evaluation 02/23/23    Authorization Type no auth needed    PT Start Time 0935    PT Stop Time 1008    PT Time Calculation (min) 33 min    Activity Tolerance Patient tolerated treatment well              Past Medical History:  Diagnosis Date   Anemia    Anxiety    Depression    Glaucoma    History of radiation therapy    Left breast 05/28/22-07/06/22-Dr. Gery Pray   Hives    chronic, on Xolair injections   Past Surgical History:  Procedure Laterality Date   BREAST BIOPSY Left 01/19/2022   BREAST CYST EXCISION Left 01/28/2021   Procedure: LEFT BREAST MASS EXCISION;  Surgeon: Rolm Bookbinder, MD;  Location: [place];  Service: General;  Laterality: Left;  START TIME OF 3:00 PM FOR 60 MINUTES IN ROOM 8   BREAST LUMPECTOMY WITH RADIOACTIVE SEED AND SENTINEL LYMPH NODE BIOPSY Left 04/13/2022   Procedure: LEFT BREAST BRACKETED LUMPECTOMY WITH RADIOACTIVE SEED AND AXILLARY SENTINEL LYMPH NODE BIOPSY;  Surgeon: Rolm Bookbinder, MD;  Location: [place];  Service: General;  Laterality: Left;   HYSTEROSCOPY W/ ENDOMETRIAL ABLATION  08/14/2020   [facility]   MASTOPEXY Bilateral 04/21/2022   Procedure: MASTOPEXY;  Surgeon: Irene Limbo, MD;  Location: [place];  Service: Plastics;  Laterality: Bilateral;   Patient Active Problem List   Diagnosis Date Noted   Anemia 12/31/2021   Malignant neoplasm of upper-inner quadrant of left breast in female, estrogen receptor positive ([place]) 12/31/2021   Open angle with borderline findings and low glaucoma risk in both eyes 01/11/2016   Chronic idiopathic urticaria 01/11/2016   Age-related nuclear cataract of both eyes 01/11/2016   Eczema 01/11/2016    PCP:  [name], MD  REFERRING PROVIDER: Dr. Chryl Heck  REFERRING DIAG: C50.212,Z17.0 (ICD-10-CM) - Malignant neoplasm of upper-inner quadrant of left breast in female, estrogen receptor positive ([place])  THERAPY DIAG:  Stiffness of left shoulder, not elsewhere classified  Malignant neoplasm of upper-inner quadrant of left breast in female, estrogen receptor positive ([place])  ONSET DATE: 12/09/21  Rationale for Evaluation and Treatment Rehabilitation  SUBJECTIVE  SUBJECTIVE STATEMENT: [place] left upper trap at rest and with movement; not especially sore after DN;  decreasing UE tingling intensity in left UE   PERTINENT HISTORY:  Patient was diagnosed on 12/09/21 with left grade 3. It measures 3 cm and is located in the upper inner quadrant. 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 04/13/22- underwent L breast lumpectomy and SLNB (0/3) 04/21/22- bilateral mastopexy. Completed radiation in August.    PAIN: 4/10 left upper trap region  PRECAUTIONS: Other: at risk of lymphedema  WEIGHT BEARING RESTRICTIONS No  FALLS:  Has patient fallen in last 6 months? 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  LEISURE: pt has been doing PT exercises 2x/wk and on the other days pt walks  HAND DOMINANCE : right   PRIOR LEVEL OF FUNCTION: Independent  PATIENT GOALS to avoid lymphedema, decrease pain   OBJECTIVE  COGNITION:  Overall cognitive status: Within functional limits for tasks assessed   PALPATION:   OBSERVATIONS / OTHER ASSESSMENTS:   POSTURE: forward head, rounded shoulders  UPPER EXTREMITY  AROM/PROM:  A/PROM RIGHT   eval  01/26/23  Shoulder extension 62   Shoulder flexion 155   Shoulder abduction 177   Shoulder internal rotation 70 Behind the back to T8  Shoulder external rotation 88     (Blank rows = not tested)  A/PROM LEFT   eval 01/26/23  Shoulder extension 57 40 - a little tingling  Shoulder flexion 160 with p! 150 - with pain in the axilla   Shoulder abduction 125 150  Shoulder internal rotation 60 Behind the back to L5 with pain  Shoulder external rotation 90 55 - shoulder pain    (Blank rows = not tested)  UE MMT: 4+/5 without pain  Roos test:no tingling on the Rt side over 30 seconds, tingling immediately on the left side.   + ULTT median both positions and ulnar nerve  LYMPHEDEMA ASSESSMENTS:  SURGERY TYPE/DATE: 04/13/22 L lumpectomy and SLNB NUMBER OF LYMPH NODES REMOVED: 0/3 CHEMOTHERAPY: Completed RADIATION:completed HORMONE TREATMENT: tamoxifen - currently taking INFECTIONS: none   TODAY'S TREATMENT  3/27: Manual therapy: soft tissue mobilization to left cervical musculature Trigger Point Dry-Needling  Treatment instructions: Expect mild to moderate muscle soreness. S/S of pneumothorax if dry needled over a lung field, and to seek immediate medical attention should they occur. Patient verbalized understanding of these instructions and education.  Patient Consent Given: Yes Education handout provided: Yes Muscles treated: left cervical multifidi, left upper traps anterior and posterior approach and left levator scap muscles Electrical stimulation performed: No Parameters: N/A Treatment response/outcome: improved soft tissue mobility and decreased tender point size and number  Therapeutic Exercises: 2 sets: 1# lateral shoulder raises 10x 1# front shoulder raises 10x 7# shrugs 10x Discussed continuation of stretches, nerve ex's and postural ex's at home to reduce post Dn soreness        02/15/23: Therapeutic Exercises  Pulleys into  flexion and abd x2 mins each with VC's to decrease Lt scapular compensation though pt was some improved with this from last session Roll yellow ball up wall into flexion x 10 and Lt abd x 10 each Supine over foam roll for following: Bil UE abd and then bil UE scaption into a "V" x10 each returning therapist demo; then still on foam  roll for bil UE abd or "snow angel" x5, 5 sec holds Then Rt S/L for Lt UE open book stretch with Lt knee on foam roll 7x, 5 sec holds Manual Therapy P/ROM of Lt shoulder into flexion, abd and D2 with scapular depression by therapist throughout, pt much more relaxed now and with improved end P/ROM STM to Lt axilla and lat insertion where pt palpably tight in supine but also much improved Scap Mobs in Rt S/L to Lt scapula into retraction and protraction to help improve scap mobility, this was much improved today  02/11/23: Therapeutic Exercises  Pulleys into flexion and abd x2 mins each with VC's to decrease Lt scapular compensation though pt was some improved with this from last session Roll yellow ball up wall into flexion x 10 and Lt abd x 10 each Supine over foam roll for following: Bil UE abd and then bil UE scaption into a "V" x10 each returning therapist demo; then supine on mat for bil UE abd or "snow angel" x5, 5 sec holds Then Rt S/L for Lt UE open book stretch with Lt knee on foam roll Manual Therapy P/ROM of Lt shoulder into flexion, abd and D2 with scapular depression by therapist throughout, pt much more relaxed now and with improved end P/ROM STM to Lt axilla and lat insertion where pt palpably tight in supine but also much improved Scap Mobs in Rt S/L to Lt scapula into retraction and protraction to help improve scap mobility, this was much improved today PATIENT EDUCATION:  Education details: cording and importance of stretching to reduce cording, L breast lymphedema vs. Post op radiation edema, vs edema from cording;  DN education written instructions  provided Person educated: Patient Education method: Explanation Education comprehension: verbalized understanding  HOME EXERCISE PROGRAM: Access Code: [email or web address] URL: [email or web address] Date: 02/09/2023 Prepared by: Ruben Im  Exercises - Median Nerve Flossing  - 1 x daily - 7 x weekly - 1 sets - 10 reps - no hold - Ulnar Nerve Flossing  - 1 x daily - 7 x weekly - 1 sets - 10 reps - no hold - Doorway Pec Stretch at 90 Degrees Abduction  - 2-3 x daily - 7 x weekly - 1 sets - 3 reps - 20-30 seconds hold - Standing shoulder flexion wall slides  - 1-2 x daily - 7 x weekly - 1 sets - 5 reps - 20-30 seconds hold - Seated Cervical Sidebending Stretch  - 1-2 x daily - 7 x weekly - 1 sets - 3 reps - 20-30 seconds hold - Seated Levator Scapulae Stretch  - 1-2 x daily - 7 x weekly - 1 sets - 3 reps - Standing Pec Stretch at Wall  - 1 x daily - 7 x weekly - 1 sets - 10 reps - Standing Thoracic Open Book at [place]  - 1 x daily - 7 x weekly - 1 sets - 10 reps - Radial Nerve Flossing  - 1 x daily - 7 x weekly - 1 sets - 10 reps  Access Code: [email or web address] URL: [email or web address] Date: 02/04/2023 Prepared by: Ruben Im  Exercises - Median Nerve Flossing  - 1 x daily - 7 x weekly - 1 sets - 10 reps - no hold - Ulnar Nerve Flossing  - 1 x daily - 7 x weekly - 1 sets - 10 reps - no hold - Doorway Pec Stretch at 90 Degrees Abduction  - 2-3 x daily - 7 x weekly - 1 sets -  3 reps - 20-30 seconds hold - Standing shoulder flexion wall slides  - 1-2 x daily - 7 x weekly - 1 sets - 5 reps - 20-30 seconds hold - Seated Cervical Sidebending Stretch  - 1-2 x daily - 7 x weekly - 1 sets - 3 reps - 20-30 seconds hold - Seated Levator Scapulae Stretch  - 1-2 x daily - 7 x weekly - 1 sets - 3 reps - Standing Pec Stretch at Wall  - 1 x daily - 7 x weekly - 1 sets - 10 reps - Standing Thoracic Open Book at [place]  - 1 x daily - 7 x weekly - 1 sets - 10 reps  ASSESSMENT: CLINICAL  IMPRESSION: Decreased tender point size and number since start of care.  She has responded well to DN and manual therapy with reduction in tingling and neck discomfort.  The patient was encouraged in regular performance of HEP post DN including soft tissue lengthening and strengthening exercises to enhance long term benefit.   OBJECTIVE IMPAIRMENTS decreased ROM, increased edema, increased fascial restrictions, impaired UE functional use, postural dysfunction, and pain.   ACTIVITY LIMITATIONS carrying, lifting, and reach over head  PARTICIPATION LIMITATIONS:  none  PERSONAL FACTORS  none  are also affecting patient's functional outcome.   REHAB POTENTIAL: Good  CLINICAL DECISION MAKING: Stable/uncomplicated  EVALUATION COMPLEXITY: Low  GOALS: Goals reviewed with patient? Yes  SHORT TERM GOALS=LONG TERM GOALS  Target date: 02/23/2023    Pt will be independent in HEP for continued mobility and strength  Baseline: Goal status: met 3/27  2.  Pt will report a 75% improvement in pain in the left axilla for improved comfort.  Baseline:  Goal status: INITIAL   PLAN: PT FREQUENCY: 2x/week  PT DURATION: 4 weeks  PLANNED INTERVENTIONS: Therapeutic exercises, Therapeutic activity, Patient/Family education, Self Care, Joint mobilization, Manual lymph drainage, Compression bandaging, scar mobilization, Taping, Vasopneumatic device, dry needling and Manual therapy  PLAN FOR NEXT SESSION: Cont Lt shoulder/upper quadrant STM/MFR, assess response to DN #3 for UT, neck muscles; check % improvement to determine readiness for discharge or if ERO is needed   Ruben Im, PT 02/17/23 10:14 AM Phone: [phone] Fax: [phone]
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
## 2023-02-22 ENCOUNTER — Ambulatory Visit (INDEPENDENT_AMBULATORY_CARE_PROVIDER_SITE_OTHER): Payer: 59 | Admitting: Psychiatry

## 2023-02-22 DIAGNOSIS — F331 Major depressive disorder, recurrent, moderate: Secondary | ICD-10-CM | POA: Diagnosis not present

## 2023-02-22 DIAGNOSIS — F4310 Post-traumatic stress disorder, unspecified: Secondary | ICD-10-CM | POA: Diagnosis not present
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
## 2023-02-23 ENCOUNTER — Ambulatory Visit: Payer: 59 | Attending: Hematology and Oncology

## 2023-02-23 DIAGNOSIS — Z483 Aftercare following surgery for neoplasm: Secondary | ICD-10-CM

## 2023-02-23 DIAGNOSIS — C50212 Malignant neoplasm of upper-inner quadrant of left female breast: Secondary | ICD-10-CM | POA: Diagnosis present

## 2023-02-23 DIAGNOSIS — Z170 Estrogen receptor positive status [ER+]: Secondary | ICD-10-CM | POA: Diagnosis present

## 2023-02-23 DIAGNOSIS — M25612 Stiffness of left shoulder, not elsewhere classified: Secondary | ICD-10-CM | POA: Diagnosis present

## 2023-02-23 DIAGNOSIS — L599 Disorder of the skin and subcutaneous tissue related to radiation, unspecified: Secondary | ICD-10-CM | POA: Diagnosis present

## 2023-02-23 NOTE — Therapy (Signed)
OUTPATIENT PHYSICAL THERAPY ONCOLOGY TREATMENT  Patient Name: Maria Diaz MRN: [id number] DOB:03-Mar-1971, 52 y.o., female Today's Date: 02/23/2023   PT End of Session - 02/23/23 1010     Visit Number 22    Number of Visits 22    Date for PT Re-Evaluation 02/23/23    Authorization Type no auth needed    PT Start Time 1008    PT Stop Time 1102    PT Time Calculation (min) 54 min    Activity Tolerance Patient tolerated treatment well    Behavior During Therapy [facility] for tasks assessed/performed              Past Medical History:  Diagnosis Date   Anemia    Anxiety    Depression    Glaucoma    History of radiation therapy    Left breast 05/28/22-07/06/22-Dr. Gery Pray   Hives    chronic, on Xolair injections   Past Surgical History:  Procedure Laterality Date   BREAST BIOPSY Left 01/19/2022   BREAST CYST EXCISION Left 01/28/2021   Procedure: LEFT BREAST MASS EXCISION;  Surgeon: Rolm Bookbinder, MD;  Location: [place];  Service: General;  Laterality: Left;  START TIME OF 3:00 PM FOR 60 MINUTES IN ROOM 8   BREAST LUMPECTOMY WITH RADIOACTIVE SEED AND SENTINEL LYMPH NODE BIOPSY Left 04/13/2022   Procedure: LEFT BREAST BRACKETED LUMPECTOMY WITH RADIOACTIVE SEED AND AXILLARY SENTINEL LYMPH NODE BIOPSY;  Surgeon: Rolm Bookbinder, MD;  Location: [place];  Service: General;  Laterality: Left;   HYSTEROSCOPY W/ ENDOMETRIAL ABLATION  08/14/2020   [facility]   MASTOPEXY Bilateral 04/21/2022   Procedure: MASTOPEXY;  Surgeon: Irene Limbo, MD;  Location: [place];  Service: Plastics;  Laterality: Bilateral;   Patient Active Problem List   Diagnosis Date Noted   Anemia 12/31/2021   Malignant neoplasm of upper-inner quadrant of left breast in female, estrogen receptor positive 12/31/2021   Open angle with borderline findings and low glaucoma risk in both eyes 01/11/2016   Chronic idiopathic urticaria 01/11/2016   Age-related  nuclear cataract of both eyes 01/11/2016   Eczema 01/11/2016    PCP: [name], MD  REFERRING PROVIDER: Dr. Chryl Heck  REFERRING DIAG: C50.212,Z17.0 (ICD-10-CM) - Malignant neoplasm of upper-inner quadrant of left breast in female, estrogen receptor positive ([place])  THERAPY DIAG:  Stiffness of left shoulder, not elsewhere classified  Malignant neoplasm of upper-inner quadrant of left breast in female, estrogen receptor positive  Aftercare following surgery for neoplasm  Disorder of the skin and subcutaneous tissue related to radiation, unspecified  ONSET DATE: 12/09/21  Rationale for Evaluation and Treatment Rehabilitation  SUBJECTIVE  SUBJECTIVE STATEMENT: I feel pretty good after last DN session. I don't get sore after like I had initially. Overall I'm feeling much better.    PERTINENT HISTORY:  Patient was diagnosed on 12/09/21 with left grade 3. It measures 3 cm and is located in the upper inner quadrant. 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 04/13/22- underwent L breast lumpectomy and SLNB (0/3) 04/21/22- bilateral mastopexy. Completed radiation in August.    PAIN: No pain today  PRECAUTIONS: Other: at risk of lymphedema  WEIGHT BEARING RESTRICTIONS No  FALLS:  Has patient fallen in last 6 months? 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  LEISURE: pt has been doing PT exercises 2x/wk and on the other days pt walks  HAND DOMINANCE : right   PRIOR LEVEL OF FUNCTION: Independent  PATIENT GOALS to avoid lymphedema, decrease pain   OBJECTIVE  COGNITION:  Overall cognitive  status: Within functional limits for tasks assessed   PALPATION:   OBSERVATIONS / OTHER ASSESSMENTS:   POSTURE: forward head, rounded shoulders  UPPER EXTREMITY AROM/PROM:  A/PROM RIGHT   eval  01/26/23  Shoulder extension 62   Shoulder flexion 155   Shoulder abduction 177   Shoulder internal rotation 70 Behind the back to T8  Shoulder external rotation 88     (Blank rows = not tested)  A/PROM LEFT   eval 01/26/23  Shoulder extension 57 40 - a little tingling  Shoulder flexion 160 with p! 150 - with pain in the axilla   Shoulder abduction 125 150  Shoulder internal rotation 60 Behind the back to L5 with pain  Shoulder external rotation 90 55 - shoulder pain    (Blank rows = not tested)  UE MMT: 4+/5 without pain  [place] test:no tingling on the Rt side over 30 seconds, tingling immediately on the left side.   + ULTT median both positions and ulnar nerve  LYMPHEDEMA ASSESSMENTS:  SURGERY TYPE/DATE: 04/13/22 L lumpectomy and SLNB NUMBER OF LYMPH NODES REMOVED: 0/3 CHEMOTHERAPY: Completed RADIATION:completed HORMONE TREATMENT: tamoxifen - currently taking INFECTIONS: none   TODAY'S TREATMENT  02/23/23: Therapeutic Exercises  Pulleys into flexion and abd x2 mins each with VC's, pt with no scapular compensations today, much improved Roll yellow ball up wall into flexion x 10 and Lt abd x 10 each Standing with back, shoulders, and head against wall with core engaged for bil UE 3 way raises (flex, scaption and abd to shoulder height) with 1# 2 x 10 returning therapist demo for each. Supine over foam roll for following: Bil UE abd and then bil UE scaption into a "V" x10 each returning therapist demo; then bil UE abd or "snow angel" x5, 5 sec holds Manual Therapy P/ROM of Lt shoulder into flexion, abd and D2 with scapular depression by therapist throughout, pt conts with improved end P/ROM and improved ability to relax STM to Lt axilla and lat insertion where pt palpably tight in  supine but also much improved  3/27: Manual therapy: soft tissue mobilization to left cervical musculature Trigger Point Dry-Needling  Treatment instructions: Expect mild to moderate muscle soreness. S/S of pneumothorax if dry needled over a lung field, and to seek immediate medical attention should they occur. Patient verbalized understanding of these instructions and education.  Patient Consent Given: Yes Education handout  provided: Yes Muscles treated: left cervical multifidi, left upper traps anterior and posterior approach and left levator scap muscles Electrical stimulation performed: No Parameters: N/A Treatment response/outcome: improved soft tissue mobility and decreased tender point size and number  Therapeutic Exercises: 2 sets: 1# lateral shoulder raises 10x 1# front shoulder raises 10x 7# shrugs 10x Discussed continuation of stretches, nerve ex's and postural ex's at home to reduce post Dn soreness   02/15/23: Therapeutic Exercises  Pulleys into flexion and abd x2 mins each with VC's to decrease Lt scapular compensation though pt was some improved with this from last session Roll yellow ball up wall into flexion x 10 and Lt abd x 10 each Supine over foam roll for following: Bil UE abd and then bil UE scaption into a "V" x10 each returning therapist demo; then still on foam roll for bil UE abd or "snow angel" x5, 5 sec holds Then Rt S/L for Lt UE open book stretch with Lt knee on foam roll 7x, 5 sec holds Manual Therapy P/ROM of Lt shoulder into flexion, abd and D2 with scapular depression by therapist throughout, pt much more relaxed now and with improved end P/ROM STM to Lt axilla and lat insertion where pt palpably tight in supine but also much improved Scap Mobs in Rt S/L to Lt scapula into retraction and protraction to help improve scap mobility, this was much improved today    PATIENT EDUCATION:  Education details: cording and importance of stretching to reduce  cording, L breast lymphedema vs. Post op radiation edema, vs edema from cording;  DN education written instructions provided Person educated: Patient Education method: Explanation Education comprehension: verbalized understanding  HOME EXERCISE PROGRAM: Access Code: [email or web address] URL: [email or web address] Date: 02/09/2023 Prepared by: Ruben Im  Exercises - Median Nerve Flossing  - 1 x daily - 7 x weekly - 1 sets - 10 reps - no hold - Ulnar Nerve Flossing  - 1 x daily - 7 x weekly - 1 sets - 10 reps - no hold - Doorway Pec Stretch at 90 Degrees Abduction  - 2-3 x daily - 7 x weekly - 1 sets - 3 reps - 20-30 seconds hold - Standing shoulder flexion wall slides  - 1-2 x daily - 7 x weekly - 1 sets - 5 reps - 20-30 seconds hold - Seated Cervical Sidebending Stretch  - 1-2 x daily - 7 x weekly - 1 sets - 3 reps - 20-30 seconds hold - Seated Levator Scapulae Stretch  - 1-2 x daily - 7 x weekly - 1 sets - 3 reps - Standing Pec Stretch at Wall  - 1 x daily - 7 x weekly - 1 sets - 10 reps - Standing Thoracic Open Book at [place]  - 1 x daily - 7 x weekly - 1 sets - 10 reps - Radial Nerve Flossing  - 1 x daily - 7 x weekly - 1 sets - 10 reps  Access Code: [email or web address] URL: [email or web address] Date: 02/04/2023 Prepared by: Ruben Im  Exercises - Median Nerve Flossing  - 1 x daily - 7 x weekly - 1 sets - 10 reps - no hold - Ulnar Nerve Flossing  - 1 x daily - 7 x weekly - 1 sets - 10 reps - no hold - Doorway Pec Stretch at 90 Degrees Abduction  - 2-3 x daily - 7 x weekly - 1 sets - 3 reps - 20-30 seconds hold - Standing shoulder flexion wall slides  - 1-2 x  daily - 7 x weekly - 1 sets - 5 reps - 20-30 seconds hold - Seated Cervical Sidebending Stretch  - 1-2 x daily - 7 x weekly - 1 sets - 3 reps - 20-30 seconds hold - Seated Levator Scapulae Stretch  - 1-2 x daily - 7 x weekly - 1 sets - 3 reps - Standing Pec Stretch at Wall  - 1 x daily - 7 x weekly - 1 sets - 10  reps - Standing Thoracic Open Book at [place]  - 1 x daily - 7 x weekly - 1 sets - 10 reps  ASSESSMENT: CLINICAL IMPRESSION: Continued with bil UE strength and manual therapy working towards goals of improved ROM and decrease strength. She is considering cancelling her last DN appt and perhaps come for final assess in about 2 weeks. Pt will be ready for D/C per POC next session.   OBJECTIVE IMPAIRMENTS decreased ROM, increased edema, increased fascial restrictions, impaired UE functional use, postural dysfunction, and pain.   ACTIVITY LIMITATIONS carrying, lifting, and reach over head  PARTICIPATION LIMITATIONS:  none  PERSONAL FACTORS  none  are also affecting patient's functional outcome.   REHAB POTENTIAL: Good  CLINICAL DECISION MAKING: Stable/uncomplicated  EVALUATION COMPLEXITY: Low  GOALS: Goals reviewed with patient? Yes  SHORT TERM GOALS=LONG TERM GOALS  Target date: 02/23/2023    Pt will be independent in HEP for continued mobility and strength  Baseline: Goal status: met 3/27  2.  Pt will report a 75% improvement in pain in the left axilla for improved comfort.  Baseline:  Goal status: INITIAL   PLAN: PT FREQUENCY: 2x/week  PT DURATION: 4 weeks  PLANNED INTERVENTIONS: Therapeutic exercises, Therapeutic activity, Patient/Family education, Self Care, Joint mobilization, Manual lymph drainage, Compression bandaging, scar mobilization, Taping, Vasopneumatic device, dry needling and Manual therapy  PLAN FOR NEXT SESSION: Cont Lt shoulder/upper quadrant STM/MFR, assess response to DN #3 for UT, neck muscles; check % improvement to determine readiness for discharge or if ERO is needed  Collie Siad, PTA 02/23/23 11:12 AM

## 2023-02-24 ENCOUNTER — Encounter: Payer: Self-pay | Admitting: Psychiatry

## 2023-02-24 ENCOUNTER — Telehealth (INDEPENDENT_AMBULATORY_CARE_PROVIDER_SITE_OTHER): Payer: 59 | Admitting: Psychiatry

## 2023-02-24 DIAGNOSIS — F4310 Post-traumatic stress disorder, unspecified: Secondary | ICD-10-CM

## 2023-02-24 DIAGNOSIS — F331 Major depressive disorder, recurrent, moderate: Secondary | ICD-10-CM

## 2023-02-24 DIAGNOSIS — G4700 Insomnia, unspecified: Secondary | ICD-10-CM | POA: Diagnosis not present

## 2023-02-24 MED ORDER — MIRTAZAPINE 15 MG PO TABS: 15.0000 mg | ORAL_TABLET | Freq: Every day | ORAL | 0 refills | Status: DC

## 2023-02-24 NOTE — Patient Instructions (Signed)
Continue sertraline 200 mg daily  Continue mirtazapine 15 mg at night Continue hydroxyzine 25 mg daily as needed for anxiety  Continue prazosin 2 mg at night Continue Trazodone 50-100 mg at night as needed for insomnia Next appointment- 5/1 at 56 30

## 2023-02-25 ENCOUNTER — Ambulatory Visit: Payer: 59 | Admitting: Physical Therapy

## 2023-02-25 ENCOUNTER — Telehealth: Payer: Self-pay | Admitting: Psychiatry

## 2023-02-25 NOTE — Telephone Encounter (Signed)
I have made an addendum to the form she requested, which the nurse will fax. Additionally, please contact the patient to clarify that although she states that "they do not need any of my personal medical information," my progress/office notes do include those diagnoses, and I cannot delete them. Please ensure this with the patient.

## 2023-02-26 ENCOUNTER — Telehealth: Payer: Self-pay

## 2023-02-26 NOTE — Telephone Encounter (Signed)
faxed and confirmed [facility] Paperwork for patient. also put in the scann basket to be entered.

## 2023-02-26 NOTE — Telephone Encounter (Signed)
Paperwork faxed to to Disability on 02/25/23 ([phone]) without chart notes per patient she stated that we did not need to send anything with her medical information.

## 2023-03-02 ENCOUNTER — Other Ambulatory Visit: Payer: Self-pay | Admitting: Psychiatry

## 2023-03-04 ENCOUNTER — Other Ambulatory Visit: Payer: Self-pay | Admitting: Family Medicine

## 2023-03-08 ENCOUNTER — Ambulatory Visit (INDEPENDENT_AMBULATORY_CARE_PROVIDER_SITE_OTHER): Payer: 59 | Admitting: Psychiatry

## 2023-03-08 ENCOUNTER — Other Ambulatory Visit: Payer: Self-pay | Admitting: Psychiatry

## 2023-03-08 DIAGNOSIS — F331 Major depressive disorder, recurrent, moderate: Secondary | ICD-10-CM | POA: Diagnosis not present

## 2023-03-08 DIAGNOSIS — F4310 Post-traumatic stress disorder, unspecified: Secondary | ICD-10-CM | POA: Diagnosis not present

## 2023-03-08 MED ORDER — TRAZODONE HCL 100 MG PO TABS: 50.0000 mg | ORAL_TABLET | Freq: Every day | ORAL | 0 refills | Status: DC
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
## 2023-03-14 ENCOUNTER — Other Ambulatory Visit: Payer: Self-pay | Admitting: Psychiatry
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
## 2023-03-22 ENCOUNTER — Ambulatory Visit (INDEPENDENT_AMBULATORY_CARE_PROVIDER_SITE_OTHER): Payer: 59 | Admitting: Psychiatry

## 2023-03-22 DIAGNOSIS — F4310 Post-traumatic stress disorder, unspecified: Secondary | ICD-10-CM | POA: Diagnosis not present

## 2023-03-22 DIAGNOSIS — F331 Major depressive disorder, recurrent, moderate: Secondary | ICD-10-CM | POA: Diagnosis not present
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
## 2023-03-24 ENCOUNTER — Telehealth (INDEPENDENT_AMBULATORY_CARE_PROVIDER_SITE_OTHER): Payer: 59 | Admitting: Psychiatry

## 2023-03-24 ENCOUNTER — Encounter: Payer: Self-pay | Admitting: Psychiatry

## 2023-03-24 DIAGNOSIS — G4700 Insomnia, unspecified: Secondary | ICD-10-CM

## 2023-03-24 DIAGNOSIS — F4310 Post-traumatic stress disorder, unspecified: Secondary | ICD-10-CM

## 2023-03-24 DIAGNOSIS — F331 Major depressive disorder, recurrent, moderate: Secondary | ICD-10-CM | POA: Diagnosis not present

## 2023-03-24 MED ORDER — PRAZOSIN HCL 2 MG PO CAPS: 2.0000 mg | ORAL_CAPSULE | Freq: Every day | ORAL | 1 refills | Status: DC

## 2023-03-24 MED ORDER — MIRTAZAPINE 30 MG PO TABS: 30.0000 mg | ORAL_TABLET | Freq: Every day | ORAL | 1 refills | Status: DC

## 2023-04-13 ENCOUNTER — Other Ambulatory Visit: Payer: Self-pay | Admitting: Hematology and Oncology

## 2023-04-16 ENCOUNTER — Ambulatory Visit (INDEPENDENT_AMBULATORY_CARE_PROVIDER_SITE_OTHER): Payer: 59 | Admitting: Psychiatry

## 2023-04-16 DIAGNOSIS — F4310 Post-traumatic stress disorder, unspecified: Secondary | ICD-10-CM

## 2023-04-16 DIAGNOSIS — F331 Major depressive disorder, recurrent, moderate: Secondary | ICD-10-CM

## 2023-04-16 DIAGNOSIS — F339 Major depressive disorder, recurrent, unspecified: Secondary | ICD-10-CM | POA: Diagnosis not present
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
## 2023-04-20 ENCOUNTER — Other Ambulatory Visit: Payer: Self-pay | Admitting: Psychiatry

## 2023-04-30 ENCOUNTER — Ambulatory Visit (INDEPENDENT_AMBULATORY_CARE_PROVIDER_SITE_OTHER): Payer: 59 | Admitting: Psychiatry

## 2023-04-30 DIAGNOSIS — F4310 Post-traumatic stress disorder, unspecified: Secondary | ICD-10-CM

## 2023-04-30 DIAGNOSIS — F339 Major depressive disorder, recurrent, unspecified: Secondary | ICD-10-CM

## 2023-04-30 DIAGNOSIS — F331 Major depressive disorder, recurrent, moderate: Secondary | ICD-10-CM
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
## 2023-05-12 ENCOUNTER — Encounter: Payer: Self-pay | Admitting: Psychiatry

## 2023-05-12 ENCOUNTER — Telehealth (INDEPENDENT_AMBULATORY_CARE_PROVIDER_SITE_OTHER): Payer: 59 | Admitting: Psychiatry

## 2023-05-12 DIAGNOSIS — F4310 Post-traumatic stress disorder, unspecified: Secondary | ICD-10-CM

## 2023-05-12 DIAGNOSIS — F331 Major depressive disorder, recurrent, moderate: Secondary | ICD-10-CM | POA: Diagnosis not present

## 2023-05-12 DIAGNOSIS — G4700 Insomnia, unspecified: Secondary | ICD-10-CM | POA: Diagnosis not present

## 2023-05-12 MED ORDER — HYDROXYZINE HCL 25 MG PO TABS: 25.0000 mg | ORAL_TABLET | Freq: Every day | ORAL | 0 refills | Status: DC | PRN

## 2023-05-12 MED ORDER — PRAZOSIN HCL 1 MG PO CAPS: 1.0000 mg | ORAL_CAPSULE | Freq: Every day | ORAL | 1 refills | Status: DC

## 2023-05-12 MED ORDER — SERTRALINE HCL 100 MG PO TABS: 200.0000 mg | ORAL_TABLET | Freq: Every day | ORAL | 1 refills | Status: DC

## 2023-05-12 MED ORDER — MIRTAZAPINE 30 MG PO TABS: 30.0000 mg | ORAL_TABLET | Freq: Every day | ORAL | 0 refills | Status: DC

## 2023-05-12 MED ORDER — TRAZODONE HCL 100 MG PO TABS: 50.0000 mg | ORAL_TABLET | Freq: Every day | ORAL | 0 refills | Status: DC

## 2023-05-12 NOTE — Patient Instructions (Signed)
Continue sertraline 200 mg daily  Continue mirtazapine 30 mg at night Continue hydroxyzine 25 mg daily as needed for anxiety  Increase prazosin 3 mg at night Continue Trazodone 50-100 mg at night as needed for insomnia Next appointment- 7/24 at 8 am

## 2023-05-14 ENCOUNTER — Ambulatory Visit (INDEPENDENT_AMBULATORY_CARE_PROVIDER_SITE_OTHER): Payer: 59 | Admitting: Psychiatry

## 2023-05-14 DIAGNOSIS — F331 Major depressive disorder, recurrent, moderate: Secondary | ICD-10-CM | POA: Diagnosis not present

## 2023-05-14 DIAGNOSIS — F4310 Post-traumatic stress disorder, unspecified: Secondary | ICD-10-CM | POA: Diagnosis not present
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
## 2023-05-25 ENCOUNTER — Other Ambulatory Visit: Payer: Self-pay | Admitting: Psychiatry

## 2023-05-25 ENCOUNTER — Encounter: Payer: Self-pay | Admitting: Hematology and Oncology

## 2023-06-02 ENCOUNTER — Other Ambulatory Visit: Payer: Self-pay

## 2023-06-02 ENCOUNTER — Inpatient Hospital Stay: Payer: 59 | Attending: Adult Health | Admitting: Adult Health

## 2023-06-02 ENCOUNTER — Encounter: Payer: Self-pay | Admitting: Adult Health

## 2023-06-02 VITALS — BP 107/75 | HR 85 | Temp 97.5°F | Resp 18 | Wt 219.2 lb

## 2023-06-02 DIAGNOSIS — N644 Mastodynia: Secondary | ICD-10-CM | POA: Diagnosis not present

## 2023-06-02 DIAGNOSIS — C50212 Malignant neoplasm of upper-inner quadrant of left female breast: Secondary | ICD-10-CM | POA: Insufficient documentation

## 2023-06-02 DIAGNOSIS — Z170 Estrogen receptor positive status [ER+]: Secondary | ICD-10-CM | POA: Diagnosis not present

## 2023-06-02 DIAGNOSIS — Z79810 Long term (current) use of selective estrogen receptor modulators (SERMs): Secondary | ICD-10-CM | POA: Insufficient documentation

## 2023-06-02 NOTE — Progress Notes (Signed)
[facility] Cancer Follow up:    Maria Lunger, DO [address] [place] [address]   DIAGNOSIS: Cancer Staging  Malignant neoplasm of upper-inner quadrant of left breast in female, estrogen receptor positive (HCC) Staging form: Breast, AJCC 8th Edition - Clinical: Stage IIB (cT2, cN0, cM0, G3, ER+, PR-, HER2-) - Unsigned Stage prefix: Initial diagnosis Histologic grading system: 3 grade system   SUMMARY OF ONCOLOGIC HISTORY: Oncology History  Malignant neoplasm of upper-inner quadrant of left breast in female, estrogen receptor positive (HCC)  12/26/2021 Genetic Testing   Negative.  The Common Hereditary Gene Panel offered by [facility] includes sequencing and/or deletion duplication testing of the following 47 genes: APC, ATM, AXIN2, BARD1, BMPR1A, BRCA1, BRCA2, BRIP1, CDH1, CDK4, CDKN2A (p14ARF), CDKN2A (p16INK4a), CHEK2, CTNNA1, DICER1, EPCAM (Deletion/duplication testing only), GREM1 (promoter region deletion/duplication testing only), KIT, MEN1, MLH1, MSH2, MSH3, MSH6, MUTYH, NBN, NF1, NHTL1, PALB2, PDGFRA, PMS2, POLD1, POLE, PTEN, RAD50, RAD51C, RAD51D, SDHB, SDHC, SDHD, SMAD4, SMARCA4. STK11, TP53, TSC1, TSC2, and VHL.  The following genes were evaluated for sequence changes only: SDHA and HOXB13 c.251G>A variant only.    12/31/2021 Initial Diagnosis   Malignant neoplasm of upper-inner quadrant of left breast in female, estrogen receptor positive (HCC)   01/08/2022 -  Chemotherapy   Completed 4 cycles of neoadjuvant TC, last cycle received on March 12, 2022     04/21/2022 Definitive Surgery   She had left breast lumpectomy with no residual carcinoma status post neoadjuvant therapy, treatment related changes, no carcinoma identified in lymph nodes.  Final pathologic staging PT0N0 she also underwent bilateral mastopexy, benign breast with no evidence of carcinoma   05/28/2022 - 07/06/2022 Radiation Therapy   Site Technique Total Dose (Gy) Dose per Fx (Gy) Completed Fx Beam  Energies  Breast, Left: Breast_L 3D 50.4/50.4 1.8 28/28 10XFFF     06/2022 -  Anti-estrogen oral therapy   Tamoxifen     CURRENT THERAPY:  INTERVAL HISTORY: Maria Diaz 52 y.o. female returns for f/u of left breast    Patient Active Problem List   Diagnosis Date Noted  . Anemia 12/31/2021  . Malignant neoplasm of upper-inner quadrant of left breast in female, estrogen receptor positive (HCC) 12/31/2021  . Open angle with borderline findings and low glaucoma risk in both eyes 01/11/2016  . Chronic idiopathic urticaria 01/11/2016  . Age-related nuclear cataract of both eyes 01/11/2016  . Eczema 01/11/2016    is allergic to haemophilus influenzae vaccines and penicillins.  MEDICAL HISTORY: Past Medical History:  Diagnosis Date  . Anemia   . Anxiety   . Depression   . Glaucoma   . History of radiation therapy    Left breast 05/28/22-07/06/22-Dr. Antony Blackbird  . Hives    chronic, on Xolair injections    SURGICAL HISTORY: Past Surgical History:  Procedure Laterality Date  . BREAST BIOPSY Left 01/19/2022  . BREAST CYST EXCISION Left 01/28/2021   Procedure: LEFT BREAST MASS EXCISION;  Surgeon: Emelia Loron, MD;  Location: [facility];  Service: General;  Laterality: Left;  START TIME OF 3:00 PM FOR 60 MINUTES IN ROOM 8  . BREAST LUMPECTOMY WITH RADIOACTIVE SEED AND SENTINEL LYMPH NODE BIOPSY Left 04/13/2022   Procedure: LEFT BREAST BRACKETED LUMPECTOMY WITH RADIOACTIVE SEED AND AXILLARY SENTINEL LYMPH NODE BIOPSY;  Surgeon: Emelia Loron, MD;  Location: [facility];  Service: General;  Laterality: Left;  . HYSTEROSCOPY W/ ENDOMETRIAL ABLATION  08/14/2020   [facility]  . MASTOPEXY Bilateral 04/21/2022  Procedure: MASTOPEXY;  Surgeon: Glenna Fellows, MD;  Location: [facility];  Service: Plastics;  Laterality: Bilateral;    SOCIAL HISTORY: Social History   Socioeconomic History  . Marital status: Divorced    Spouse  name: Not on file  . Number of children: Not on file  . Years of education: Not on file  . Highest education level: Not on file  Occupational History  . Not on file  Tobacco Use  . Smoking status: Never  . Smokeless tobacco: Never  Substance and Sexual Activity  . Alcohol use: Yes    Alcohol/week: 1.0 standard drink of alcohol    Types: 1 Glasses of wine per week    Comment: holidays  . Drug use: Never  . Sexual activity: Yes    Birth control/protection: None    Comment: ablation  Other Topics Concern  . Not on file  Social History Narrative  . Not on file   Social Determinants of Health   Financial Resource Strain: Not on file  Food Insecurity: Not on file  Transportation Needs: Not on file  Physical Activity: Not on file  Stress: Not on file  Social Connections: Not on file  Intimate Partner Violence: Not on file    FAMILY HISTORY: Family History  Problem Relation Age of Onset  . Anxiety disorder Sister   . Drug abuse Mother     Review of Systems - Oncology    PHYSICAL EXAMINATION   Onc Performance Status - 06/02/23 1132       KPS SCALE   KPS % SCORE Able to carry on normal activity, minor s/s of disease             Vitals:   06/02/23 1125  BP: 107/75  Pulse: 85  Resp: 18  Temp: (!) 97.5 F (36.4 C)  SpO2: 95%    Physical Exam  LABORATORY DATA:  CBC    Component Value Date/Time   WBC 12.1 (H) 03/12/2022 0826   WBC 8.0 07/20/2020 2320   RBC 4.13 03/12/2022 0826   HGB 12.7 03/12/2022 0826   HCT 37.8 03/12/2022 0826   PLT 314 03/12/2022 0826   MCV 91.5 03/12/2022 0826   MCH 30.8 03/12/2022 0826   MCHC 33.6 03/12/2022 0826   RDW 14.4 03/12/2022 0826   LYMPHSABS 1.2 03/12/2022 0826   MONOABS 0.9 03/12/2022 0826   EOSABS 0.0 03/12/2022 0826   BASOSABS 0.0 03/12/2022 0826    CMP     Component Value Date/Time   NA 140 03/12/2022 0826   K 3.8 03/12/2022 0826   CL 111 03/12/2022 0826   CO2 24 03/12/2022 0826   GLUCOSE 107 (H)  03/12/2022 0826   BUN 15 03/12/2022 0826   CREATININE 0.71 03/12/2022 0826   CALCIUM 9.8 03/12/2022 0826   PROT 7.7 03/12/2022 0826   ALBUMIN 4.2 03/12/2022 0826   AST 11 (L) 03/12/2022 0826   ALT 9 03/12/2022 0826   ALKPHOS 71 03/12/2022 0826   BILITOT 0.3 03/12/2022 0826   GFRNONAA >60 03/12/2022 0826   GFRAA >60 07/20/2020 2320       PENDING LABS:   RADIOGRAPHIC STUDIES:  No results found.   PATHOLOGY:     ASSESSMENT and THERAPY PLAN:   No problem-specific Assessment & Plan notes found for this encounter.   No orders of the defined types were placed in this encounter.   All questions were answered. The patient knows to call the clinic with any problems, questions or concerns. We  can certainly see the patient much sooner if necessary. This note was electronically signed. Noreene Filbert, NP 06/02/2023

## 2023-06-02 NOTE — Patient Instructions (Signed)
Breast Tenderness Breast tenderness is a common problem for women of all ages, but may also occur in men. Breast tenderness has many possible causes, including hormone changes, infections, taking certain medicines, and caffeine intake. In women, the pain usually comes and goes with the menstrual cycle, but it can also be constant. Breast tenderness may range from mild discomfort to severe pain. You may have tests, such as a mammogram or an ultrasound, to check for any unusual findings. Having breast tenderness usually does not mean that you have breast cancer. Follow these instructions at home: Managing pain and discomfort  If directed, put ice on the painful area. To do this: Put ice in a plastic bag. Place a towel between your skin and the bag. Leave the ice on for 20 minutes, 2-3 times a day. If your skin turns bright red, remove the ice right away to prevent skin damage. The risk of skin damage is higher if you cannot feel pain, heat, or cold. Wear a supportive bra or chest support: During exercise. While sleeping, if your breasts are very tender. Medicines Take over-the-counter and prescription medicines only as told by your health care provider. If the cause of your pain is an infection, you may be prescribed an antibiotic medicine. If you were prescribed antibiotics, take them as told by your health care provider. Do not stop using the antibiotic even if you start to feel better. Eating and drinking Decrease the amount of caffeine in your diet. Instead, drink more water and choose caffeine-free drinks. Your health care provider may recommend that you lessen the amount of fat in your diet. You can do this by: Limiting fried foods. Cooking foods using methods such as baking, boiling, grilling, and broiling. General instructions  Keep a log of the days and times when your breasts are most tender. Ask your health care provider how to do breast exams at home. This will help you notice if  you have an unusual growth or lump. Keep all follow-up visits. Contact a health care provider if: Any part of your breast is hard, red, and hot to the touch. This may be a sign of infection. You are a woman and have a new or painful lump in your breast that remains after your menstrual period ends. You are not breastfeeding and you have fluid, especially blood or pus, coming out of your nipples. You have a fever. Your pain does not improve or it gets worse. Your pain is interfering with your daily activities. Summary Breast tenderness may range from mild discomfort to severe pain. Breast tenderness has many possible causes, including hormone changes, infections, taking certain medicines, and caffeine intake. It can be treated with ice, wearing a supportive bra or chest support, and medicines. Make changes to your diet as told by your health care provider. This information is not intended to replace advice given to you by your health care provider. Make sure you discuss any questions you have with your health care provider. Document Revised: 01/21/2022 Document Reviewed: 01/21/2022 Elsevier Patient Education  2024 Elsevier Inc.  

## 2023-06-03 NOTE — Assessment & Plan Note (Signed)
Maria Diaz is a 52 year-old woman with stage IIB ER positive breast cancer diagnosed in 12/2021 s/p neoadjuvant chemotherapy, lumpectomy, adjuvant radiation therapy, and antiestrogen therapy with Tamoxifen daily.    Due to the area of focal tenderness I placed orders for mammogram and ultrasound to be completed at [facility].  My nurse will fax these orders over and will call to get this appointment arranged.  I gave her a handout on breast tenderness and we discussed dietary measures that may reduce some breast tenderness.  I will also see her back in 2 weeks to discuss her mammogram and ultrasound results as she may need referral to physical therapy at that time.

## 2023-06-07 ENCOUNTER — Other Ambulatory Visit: Payer: Self-pay | Admitting: Psychiatry

## 2023-06-09 ENCOUNTER — Ambulatory Visit (HOSPITAL_COMMUNITY): Payer: 59 | Admitting: Psychiatry

## 2023-06-09 DIAGNOSIS — F331 Major depressive disorder, recurrent, moderate: Secondary | ICD-10-CM

## 2023-06-09 DIAGNOSIS — F4310 Post-traumatic stress disorder, unspecified: Secondary | ICD-10-CM | POA: Diagnosis not present

## 2023-06-09 DIAGNOSIS — F329 Major depressive disorder, single episode, unspecified: Secondary | ICD-10-CM
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
## 2023-06-11 ENCOUNTER — Other Ambulatory Visit: Payer: Self-pay | Admitting: *Deleted

## 2023-06-11 DIAGNOSIS — Z170 Estrogen receptor positive status [ER+]: Secondary | ICD-10-CM

## 2023-06-11 DIAGNOSIS — R928 Other abnormal and inconclusive findings on diagnostic imaging of breast: Secondary | ICD-10-CM

## 2023-06-16 ENCOUNTER — Inpatient Hospital Stay: Payer: 59 | Admitting: Adult Health

## 2023-06-16 ENCOUNTER — Telehealth: Payer: Self-pay

## 2023-06-16 NOTE — Telephone Encounter (Signed)
Called pt this morning to advise Maria Diaz is running behind and will call shortly. Pt requests to r/s as she is going today to [facility] for br bx. She is r/s for 06/22/23 so we can review the bx results with pt. She verbalized thanks and understanding.

## 2023-06-22 ENCOUNTER — Inpatient Hospital Stay: Payer: 59 | Admitting: Adult Health

## 2023-06-22 ENCOUNTER — Encounter: Payer: Self-pay | Admitting: Adult Health

## 2023-06-22 DIAGNOSIS — C50212 Malignant neoplasm of upper-inner quadrant of left female breast: Secondary | ICD-10-CM

## 2023-06-22 DIAGNOSIS — Z170 Estrogen receptor positive status [ER+]: Secondary | ICD-10-CM

## 2023-06-22 NOTE — Progress Notes (Signed)
[facility] Cancer Follow up:    Maria Lunger, DO [address] [place] [address]  I connected with Maria Diaz on 06/22/23 at  8:45 AM EDT by telephone and verified that I am speaking with the correct person using two identifiers.  I discussed the limitations, risks, security and privacy concerns of performing an evaluation and management service by telephone and the availability of in person appointments.  I also discussed with the patient that there may be a patient responsible charge related to this service. The patient expressed understanding and agreed to proceed.   DIAGNOSIS:  Cancer Staging  Malignant neoplasm of upper-inner quadrant of left breast in female, estrogen receptor positive (HCC) Staging form: Breast, AJCC 8th Edition - Clinical: Stage IIB (cT2, cN0, cM0, G3, ER+, PR-, HER2-) - Unsigned Stage prefix: Initial diagnosis Histologic grading system: 3 grade system   SUMMARY OF ONCOLOGIC HISTORY: Oncology History  Malignant neoplasm of upper-inner quadrant of left breast in female, estrogen receptor positive (HCC)  12/26/2021 Genetic Testing   Negative.  The Common Hereditary Gene Panel offered by [facility] includes sequencing and/or deletion duplication testing of the following 47 genes: APC, ATM, AXIN2, BARD1, BMPR1A, BRCA1, BRCA2, BRIP1, CDH1, CDK4, CDKN2A (p14ARF), CDKN2A (p16INK4a), CHEK2, CTNNA1, DICER1, EPCAM (Deletion/duplication testing only), GREM1 (promoter region deletion/duplication testing only), KIT, MEN1, MLH1, MSH2, MSH3, MSH6, MUTYH, NBN, NF1, NHTL1, PALB2, PDGFRA, PMS2, POLD1, POLE, PTEN, RAD50, RAD51C, RAD51D, SDHB, SDHC, SDHD, SMAD4, SMARCA4. STK11, TP53, TSC1, TSC2, and VHL.  The following genes were evaluated for sequence changes only: SDHA and HOXB13 c.251G>A variant only.    12/31/2021 Initial Diagnosis   Malignant neoplasm of upper-inner quadrant of left breast in female, estrogen receptor positive (HCC)   01/08/2022 -  Chemotherapy    Completed 4 cycles of neoadjuvant TC, last cycle received on March 12, 2022     04/21/2022 Definitive Surgery   She had left breast lumpectomy with no residual carcinoma status post neoadjuvant therapy, treatment related changes, no carcinoma identified in lymph nodes.  Final pathologic staging PT0N0 she also underwent bilateral mastopexy, benign breast with no evidence of carcinoma   05/28/2022 - 07/06/2022 Radiation Therapy   Site Technique Total Dose (Gy) Dose per Fx (Gy) Completed Fx Beam Energies  Breast, Left: Breast_L 3D 50.4/50.4 1.8 28/28 10XFFF     06/2022 -  Anti-estrogen oral therapy   Tamoxifen     CURRENT THERAPY: Tamoxfen  INTERVAL HISTORY: Maria Diaz 52 y.o. female returns for follow-up after being seen earlier this month for swollen lymph nodes and pain in her left chest wall.  She has been continuing on tamoxifen without difficulty.    Maria Diaz underwent a left breast mammogram and ultrasound June 10, 2023 that demonstrated a 1.7 cm oval mass with indistinct margin and a 0.6 cm round mass for which ultrasound-guided aspiration with the potential to convert into a biopsy was recommended.  She underwent repeat ultrasound on June 16, 2023, the 0.6 cm mass was no longer present but the 1.7 cm cystic mass was aspirated without any difficulty and post ultrasound was no longer visible.  She was recommended to repeat screening mammogram which will be due in January 2025.  Maria Diaz notes that since undergoing the ultrasound guided cyst aspiration her breast has been feeling a lot better.  She denies any issues today.   Patient Active Problem List   Diagnosis Date Noted   Anemia 12/31/2021   Malignant neoplasm of upper-inner quadrant of left breast  in female, estrogen receptor positive (HCC) 12/31/2021   Open angle with borderline findings and low glaucoma risk in both eyes 01/11/2016   Chronic idiopathic urticaria 01/11/2016   Age-related nuclear cataract of both eyes  01/11/2016   Eczema 01/11/2016    is allergic to haemophilus influenzae vaccines and penicillins.  MEDICAL HISTORY: Past Medical History:  Diagnosis Date   Anemia    Anxiety    Depression    Glaucoma    History of radiation therapy    Left breast 05/28/22-07/06/22-Dr. Antony Blackbird   Hives    chronic, on Xolair injections    SURGICAL HISTORY: Past Surgical History:  Procedure Laterality Date   BREAST BIOPSY Left 01/19/2022   BREAST CYST EXCISION Left 01/28/2021   Procedure: LEFT BREAST MASS EXCISION;  Surgeon: Emelia Loron, MD;  Location: [facility];  Service: General;  Laterality: Left;  START TIME OF 3:00 PM FOR 60 MINUTES IN ROOM 8   BREAST LUMPECTOMY WITH RADIOACTIVE SEED AND SENTINEL LYMPH NODE BIOPSY Left 04/13/2022   Procedure: LEFT BREAST BRACKETED LUMPECTOMY WITH RADIOACTIVE SEED AND AXILLARY SENTINEL LYMPH NODE BIOPSY;  Surgeon: Emelia Loron, MD;  Location: [facility];  Service: General;  Laterality: Left;   HYSTEROSCOPY W/ ENDOMETRIAL ABLATION  08/14/2020   [facility]   MASTOPEXY Bilateral 04/21/2022   Procedure: MASTOPEXY;  Surgeon: Glenna Fellows, MD;  [facility];  Service: Plastics;  Laterality: Bilateral;    SOCIAL HISTORY: Social History   Socioeconomic History   Marital status: Divorced    Spouse name: Not on file   Number of children: Not on file   Years of education: Not on file   Highest education level: Not on file  Occupational History   Not on file  Tobacco Use   Smoking status: Never   Smokeless tobacco: Never  Substance and Sexual Activity   Alcohol use: Yes    Alcohol/week: 1.0 standard drink of alcohol    Types: 1 Glasses of wine per week    Comment: holidays   Drug use: Never   Sexual activity: Yes    Birth control/protection: None    Comment: ablation  Other Topics Concern   Not on file  Social History Narrative   Not on file   Social Determinants of Health    Financial Resource Strain: Low Risk  (07/30/2022)   Received from [facility], [facility]   Overall Financial Resource Strain (CARDIA)    Difficulty of Paying Living Expenses: Not hard at all  Food Insecurity: No Food Insecurity (07/30/2022)   Received from [facility], [facility]   Hunger Vital Sign    Worried About Running Out of Food in the Last Year: Never true    Ran Out of Food in the Last Year: Never true  Transportation Needs: No Transportation Needs (07/30/2022)   Received from [facility], [facility]   [facility] - Transportation    Lack of Transportation (Medical): No    Lack of Transportation (Non-Medical): No  Physical Activity: Insufficiently Active (07/30/2022)   Received from [facility], [facility]   Exercise Vital Sign    Days of Exercise per Week: 4 days    Minutes of Exercise per Session: 30 min  Stress: No Stress Concern Present (07/30/2022)   Received from [facility], [facility] - Occupational Stress Questionnaire    Feeling of Stress : Not at  all  Social Connections: Socially Integrated (07/30/2022)   Received from [facility], [facility]   Social Connection and Isolation Panel [NHANES]    Frequency of Communication with Friends and Family: Twice a week    Frequency of Social Gatherings with Friends and Family: Twice a week    Attends Religious Services: 1 to 4 times per year    Active Member of [employer] or Organizations: No    Attends Engineer, structural: 1 to 4 times per year    Marital Status: Married  Catering manager Violence: Not At Risk (07/30/2022)   Received from [facility], [facility]   Humiliation, Afraid, Rape, and Kick questionnaire    Fear of Current or Ex-Partner: No    Emotionally Abused: No    Physically Abused: No    Sexually Abused: No    FAMILY HISTORY: Family History  Problem Relation Age of Onset   Anxiety disorder Sister     Drug abuse Mother     Review of Systems  Constitutional:  Negative for appetite change, chills, fatigue, fever and unexpected weight change.  HENT:   Negative for hearing loss, lump/mass and trouble swallowing.   Eyes:  Negative for eye problems and icterus.  Respiratory:  Negative for chest tightness, cough and shortness of breath.   Cardiovascular:  Negative for chest pain, leg swelling and palpitations.  Gastrointestinal:  Negative for abdominal distention, abdominal pain, constipation, diarrhea, nausea and vomiting.  Endocrine: Negative for hot flashes.  Genitourinary:  Negative for difficulty urinating.   Musculoskeletal:  Negative for arthralgias.  Skin:  Negative for itching and rash.  Neurological:  Negative for dizziness, extremity weakness, headaches and numbness.  Hematological:  Negative for adenopathy. Does not bruise/bleed easily.  Psychiatric/Behavioral:  Negative for depression. The patient is not nervous/anxious.       PHYSICAL EXAMINATION  Patient appears well.  She is in no apparent distress.  Mood and behavior are normal.  Speech is normal.      ASSESSMENT and THERAPY PLAN:   Malignant neoplasm of upper-inner quadrant of left breast in female, estrogen receptor positive (HCC) Maria Diaz is a 53 year-old woman with stage IIB ER positive breast cancer diagnosed in 12/2021 s/p neoadjuvant chemotherapy, lumpectomy, adjuvant radiation therapy, and antiestrogen therapy with Tamoxifen daily.    Stage IIb breast cancer: She continues on tamoxifen with good tolerance.  She will continue this.  Her next mammogram is due in January 2025. Left breast cyst: This is completely resolved and improved since undergoing aspiration. Health maintenance: Recommended continued healthy diet and exercise.  We will see her back in January after her mammogram.  She knows to call for any questions or concerns that may arise between now and her next appointment.  Follow up instructions:     -Return to cancer center January 2025 -Mammogram due in January 2025   The patient was provided an opportunity to ask questions and all were answered. The patient agreed with the plan and demonstrated an understanding of the instructions.   The patient was advised to call back or seek an in-person evaluation if the symptoms worsen or if the condition fails to improve as anticipated.   I provided 15 minutes of face-to-face video visit time during this encounter, and > 50% was spent counseling as documented under my assessment & plan.  Lillard Anes, NP 06/22/23 9:13 AM Medical Oncology and Hematology [facility] [address] [place], [place] [address] Tel. [phone]  Fax. [phone]

## 2023-06-22 NOTE — Assessment & Plan Note (Signed)
Maria Diaz is a 52 year-old woman with stage IIB ER positive breast cancer diagnosed in 12/2021 s/p neoadjuvant chemotherapy, lumpectomy, adjuvant radiation therapy, and antiestrogen therapy with Tamoxifen daily.    Stage IIb breast cancer: She continues on tamoxifen with good tolerance.  She will continue this.  Her next mammogram is due in January 2025. Left breast cyst: This is completely resolved and improved since undergoing aspiration. Health maintenance: Recommended continued healthy diet and exercise.  We will see her back in January after her mammogram.  She knows to call for any questions or concerns that may arise between now and her next appointment.

## 2023-06-23 ENCOUNTER — Encounter: Payer: Self-pay | Admitting: Adult Health

## 2023-06-23 ENCOUNTER — Ambulatory Visit (INDEPENDENT_AMBULATORY_CARE_PROVIDER_SITE_OTHER): Payer: 59 | Admitting: Psychiatry

## 2023-06-23 ENCOUNTER — Telehealth: Payer: Self-pay | Admitting: Adult Health

## 2023-06-23 DIAGNOSIS — F331 Major depressive disorder, recurrent, moderate: Secondary | ICD-10-CM

## 2023-06-23 DIAGNOSIS — F339 Major depressive disorder, recurrent, unspecified: Secondary | ICD-10-CM | POA: Diagnosis not present

## 2023-06-23 DIAGNOSIS — F4310 Post-traumatic stress disorder, unspecified: Secondary | ICD-10-CM
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
## 2023-06-23 NOTE — Telephone Encounter (Signed)
Rescheduled appointment per provider 7/30 los. Left voicemail for patient.

## 2023-06-24 ENCOUNTER — Encounter: Payer: Self-pay | Admitting: Adult Health

## 2023-06-26 ENCOUNTER — Other Ambulatory Visit: Payer: Self-pay | Admitting: Psychiatry

## 2023-07-05 ENCOUNTER — Telehealth: Payer: Self-pay | Admitting: Psychiatry

## 2023-07-05 ENCOUNTER — Other Ambulatory Visit: Payer: Self-pay | Admitting: Psychiatry

## 2023-07-05 MED ORDER — MIRTAZAPINE 30 MG PO TABS: 30.0000 mg | ORAL_TABLET | Freq: Every day | ORAL | 0 refills | Status: DC

## 2023-07-05 NOTE — Telephone Encounter (Signed)
Received message from [facility], CMA that patient needed medication refill but also has not been seen since 05/12/23. She was not put on schedule for 06/16/23. Called and left her a message to contact the office to schedule a follow up virtual appointment.

## 2023-07-05 NOTE — Telephone Encounter (Signed)
I noticed her message requesting prazosin in my chart, though it's not yet released to me. She should have enough to last a few more months. If she hasn't received the refill yet, could you advise her to follow up? If there are any issues, please contact the pharmacy to verify.

## 2023-07-06 NOTE — Telephone Encounter (Signed)
Please contact her if it has not been done. Thanks.
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
## 2023-07-12 ENCOUNTER — Other Ambulatory Visit: Payer: Self-pay | Admitting: Psychiatry

## 2023-07-13 ENCOUNTER — Encounter: Payer: Self-pay | Admitting: Psychiatry

## 2023-07-13 ENCOUNTER — Telehealth: Payer: 59 | Admitting: Psychiatry

## 2023-07-13 DIAGNOSIS — F4310 Post-traumatic stress disorder, unspecified: Secondary | ICD-10-CM | POA: Diagnosis not present

## 2023-07-13 DIAGNOSIS — F331 Major depressive disorder, recurrent, moderate: Secondary | ICD-10-CM | POA: Diagnosis not present

## 2023-07-13 DIAGNOSIS — G4700 Insomnia, unspecified: Secondary | ICD-10-CM

## 2023-07-13 MED ORDER — PRAZOSIN HCL 2 MG PO CAPS: 4.0000 mg | ORAL_CAPSULE | Freq: Every day | ORAL | 0 refills | Status: DC

## 2023-07-13 MED ORDER — HYDROXYZINE HCL 25 MG PO TABS: 25.0000 mg | ORAL_TABLET | Freq: Every day | ORAL | 1 refills | Status: AC | PRN

## 2023-07-13 MED ORDER — MIRTAZAPINE 30 MG PO TABS: 30.0000 mg | ORAL_TABLET | Freq: Every day | ORAL | 0 refills | Status: DC

## 2023-07-13 NOTE — Patient Instructions (Signed)
Continue sertraline 200 mg daily Continue mirtazapine 30 mg at night Continue hydroxyzine 25 mg daily as needed for anxiety  Increase prazosin 4 mg at night  Continue Trazodone 50-100 mg at night as needed for insomnia Next appointment- 10/8 at 1 pm

## 2023-07-14 ENCOUNTER — Ambulatory Visit (INDEPENDENT_AMBULATORY_CARE_PROVIDER_SITE_OTHER): Payer: 59 | Admitting: Psychiatry

## 2023-07-14 DIAGNOSIS — F329 Major depressive disorder, single episode, unspecified: Secondary | ICD-10-CM

## 2023-07-14 DIAGNOSIS — F331 Major depressive disorder, recurrent, moderate: Secondary | ICD-10-CM

## 2023-07-14 DIAGNOSIS — F4310 Post-traumatic stress disorder, unspecified: Secondary | ICD-10-CM | POA: Diagnosis not present
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
## 2023-07-22 ENCOUNTER — Ambulatory Visit: Payer: 59 | Admitting: Hematology and Oncology

## 2023-08-03 LAB — TSH: TSH: 0.53 (ref 0.41–5.90)

## 2023-08-10 ENCOUNTER — Other Ambulatory Visit: Payer: Self-pay | Admitting: Psychiatry

## 2023-08-12 ENCOUNTER — Ambulatory Visit (HOSPITAL_COMMUNITY): Payer: 59 | Admitting: Psychiatry

## 2023-08-26 ENCOUNTER — Ambulatory Visit (HOSPITAL_COMMUNITY): Payer: 59 | Admitting: Psychiatry

## 2023-08-26 DIAGNOSIS — F331 Major depressive disorder, recurrent, moderate: Secondary | ICD-10-CM

## 2023-08-26 DIAGNOSIS — F329 Major depressive disorder, single episode, unspecified: Secondary | ICD-10-CM | POA: Diagnosis not present

## 2023-08-26 DIAGNOSIS — F4310 Post-traumatic stress disorder, unspecified: Secondary | ICD-10-CM | POA: Diagnosis not present
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
## 2023-08-31 ENCOUNTER — Ambulatory Visit: Payer: 59 | Admitting: Psychiatry

## 2023-09-02 ENCOUNTER — Other Ambulatory Visit: Payer: Self-pay | Admitting: Psychiatry

## 2023-09-09 ENCOUNTER — Ambulatory Visit (HOSPITAL_COMMUNITY): Payer: 59 | Admitting: Psychiatry

## 2023-09-09 DIAGNOSIS — F329 Major depressive disorder, single episode, unspecified: Secondary | ICD-10-CM

## 2023-09-09 DIAGNOSIS — F331 Major depressive disorder, recurrent, moderate: Secondary | ICD-10-CM

## 2023-09-09 DIAGNOSIS — F4310 Post-traumatic stress disorder, unspecified: Secondary | ICD-10-CM | POA: Diagnosis not present
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
## 2023-09-16 ENCOUNTER — Telehealth: Payer: 59 | Admitting: Psychiatry

## 2023-09-16 ENCOUNTER — Encounter: Payer: Self-pay | Admitting: Psychiatry

## 2023-09-16 DIAGNOSIS — F4310 Post-traumatic stress disorder, unspecified: Secondary | ICD-10-CM | POA: Diagnosis not present

## 2023-09-16 DIAGNOSIS — F331 Major depressive disorder, recurrent, moderate: Secondary | ICD-10-CM

## 2023-09-16 DIAGNOSIS — G4700 Insomnia, unspecified: Secondary | ICD-10-CM

## 2023-09-16 MED ORDER — MIRTAZAPINE 30 MG PO TABS: 30.0000 mg | ORAL_TABLET | Freq: Every day | ORAL | 0 refills | Status: DC

## 2023-09-16 NOTE — Patient Instructions (Signed)
Continue sertraline 200 mg daily Continue mirtazapine 30 mg at night Continue hydroxyzine 25 mg daily as needed for anxiety  Decrease prazosin 3 mg at night  Continue Trazodone 50-100 mg at night as needed for insomnia Next appointment- 11/20 at 2 30

## 2023-09-23 ENCOUNTER — Ambulatory Visit (HOSPITAL_COMMUNITY): Payer: 59 | Admitting: Psychiatry

## 2023-09-23 DIAGNOSIS — F331 Major depressive disorder, recurrent, moderate: Secondary | ICD-10-CM

## 2023-09-23 DIAGNOSIS — F4310 Post-traumatic stress disorder, unspecified: Secondary | ICD-10-CM
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
## 2023-10-13 ENCOUNTER — Encounter: Payer: Self-pay | Admitting: Psychiatry

## 2023-10-13 ENCOUNTER — Telehealth (INDEPENDENT_AMBULATORY_CARE_PROVIDER_SITE_OTHER): Payer: 59 | Admitting: Psychiatry

## 2023-10-13 DIAGNOSIS — G4700 Insomnia, unspecified: Secondary | ICD-10-CM

## 2023-10-13 DIAGNOSIS — F331 Major depressive disorder, recurrent, moderate: Secondary | ICD-10-CM | POA: Diagnosis not present

## 2023-10-13 DIAGNOSIS — F4310 Post-traumatic stress disorder, unspecified: Secondary | ICD-10-CM

## 2023-10-20 ENCOUNTER — Ambulatory Visit (HOSPITAL_COMMUNITY): Payer: 59 | Admitting: Psychiatry

## 2023-10-20 DIAGNOSIS — F331 Major depressive disorder, recurrent, moderate: Secondary | ICD-10-CM | POA: Diagnosis not present

## 2023-10-20 DIAGNOSIS — F4310 Post-traumatic stress disorder, unspecified: Secondary | ICD-10-CM | POA: Diagnosis not present
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
## 2023-11-03 LAB — TSH: TSH: 0.35 — AB (ref 0.41–5.90)

## 2023-11-04 ENCOUNTER — Ambulatory Visit (HOSPITAL_COMMUNITY): Payer: 59 | Admitting: Psychiatry

## 2023-11-04 DIAGNOSIS — F331 Major depressive disorder, recurrent, moderate: Secondary | ICD-10-CM

## 2023-11-04 DIAGNOSIS — F4310 Post-traumatic stress disorder, unspecified: Secondary | ICD-10-CM | POA: Diagnosis not present
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
## 2023-11-23 ENCOUNTER — Telehealth: Payer: 59 | Admitting: Psychiatry

## 2023-11-23 ENCOUNTER — Encounter: Payer: Self-pay | Admitting: Psychiatry

## 2023-11-23 DIAGNOSIS — G4700 Insomnia, unspecified: Secondary | ICD-10-CM | POA: Diagnosis not present

## 2023-11-23 DIAGNOSIS — F4310 Post-traumatic stress disorder, unspecified: Secondary | ICD-10-CM | POA: Diagnosis not present

## 2023-11-23 DIAGNOSIS — E039 Hypothyroidism, unspecified: Secondary | ICD-10-CM | POA: Diagnosis not present

## 2023-11-23 DIAGNOSIS — F331 Major depressive disorder, recurrent, moderate: Secondary | ICD-10-CM

## 2023-11-23 MED ORDER — TRAZODONE HCL 100 MG PO TABS: 50.0000 mg | ORAL_TABLET | Freq: Every day | ORAL | 0 refills | Status: DC

## 2023-11-23 MED ORDER — SERTRALINE HCL 100 MG PO TABS: 200.0000 mg | ORAL_TABLET | Freq: Every day | ORAL | 1 refills | Status: DC

## 2023-11-23 MED ORDER — PRAZOSIN HCL 1 MG PO CAPS: 3.0000 mg | ORAL_CAPSULE | Freq: Every day | ORAL | 0 refills | Status: DC

## 2023-11-23 NOTE — Patient Instructions (Signed)
 Continue sertraline 200 mg daily Continue mirtazapine 30 mg at night Continue hydroxyzine 25 mg daily as needed for anxiety  Continue prazosin 3 mg at night  Continue Trazodone 50-100 mg at night as needed for insomnia Next appointment- 2/19 at 10 AM

## 2023-12-07 ENCOUNTER — Ambulatory Visit (HOSPITAL_COMMUNITY): Payer: 59 | Admitting: Psychiatry

## 2023-12-07 DIAGNOSIS — F331 Major depressive disorder, recurrent, moderate: Secondary | ICD-10-CM

## 2023-12-07 DIAGNOSIS — F4310 Post-traumatic stress disorder, unspecified: Secondary | ICD-10-CM
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
## 2023-12-14 ENCOUNTER — Other Ambulatory Visit: Payer: Self-pay | Admitting: *Deleted

## 2023-12-14 DIAGNOSIS — C50212 Malignant neoplasm of upper-inner quadrant of left female breast: Secondary | ICD-10-CM

## 2023-12-17 ENCOUNTER — Encounter: Payer: Self-pay | Admitting: Hematology and Oncology

## 2023-12-21 ENCOUNTER — Inpatient Hospital Stay: Payer: 59 | Attending: Hematology and Oncology | Admitting: Hematology and Oncology

## 2023-12-21 ENCOUNTER — Ambulatory Visit (INDEPENDENT_AMBULATORY_CARE_PROVIDER_SITE_OTHER): Payer: 59 | Admitting: Psychiatry

## 2023-12-21 VITALS — BP 103/59 | HR 80 | Temp 97.3°F | Resp 16 | Wt 218.6 lb

## 2023-12-21 DIAGNOSIS — F329 Major depressive disorder, single episode, unspecified: Secondary | ICD-10-CM | POA: Diagnosis not present

## 2023-12-21 DIAGNOSIS — Z1722 Progesterone receptor negative status: Secondary | ICD-10-CM | POA: Insufficient documentation

## 2023-12-21 DIAGNOSIS — C50212 Malignant neoplasm of upper-inner quadrant of left female breast: Secondary | ICD-10-CM | POA: Insufficient documentation

## 2023-12-21 DIAGNOSIS — F4310 Post-traumatic stress disorder, unspecified: Secondary | ICD-10-CM | POA: Diagnosis not present

## 2023-12-21 DIAGNOSIS — Z9221 Personal history of antineoplastic chemotherapy: Secondary | ICD-10-CM | POA: Diagnosis not present

## 2023-12-21 DIAGNOSIS — Z79810 Long term (current) use of selective estrogen receptor modulators (SERMs): Secondary | ICD-10-CM | POA: Diagnosis not present

## 2023-12-21 DIAGNOSIS — Z170 Estrogen receptor positive status [ER+]: Secondary | ICD-10-CM | POA: Diagnosis not present

## 2023-12-21 DIAGNOSIS — Z1732 Human epidermal growth factor receptor 2 negative status: Secondary | ICD-10-CM | POA: Insufficient documentation

## 2023-12-21 DIAGNOSIS — Z923 Personal history of irradiation: Secondary | ICD-10-CM | POA: Diagnosis not present
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
## 2023-12-21 NOTE — Progress Notes (Signed)
[facility] Cancer Follow up:    Maria Lunger, DO [address] [place] [address]   DIAGNOSIS:  Cancer Staging  Malignant neoplasm of upper-inner quadrant of left breast in female, estrogen receptor positive (HCC) Staging form: Breast, AJCC 8th Edition - Clinical: Stage IIB (cT2, cN0, cM0, G3, ER+, PR-, HER2-) - Signed by Rachel Moulds, MD on 12/21/2023 Stage prefix: Initial diagnosis Histologic grading system: 3 grade system   SUMMARY OF ONCOLOGIC HISTORY: Oncology History  Malignant neoplasm of upper-inner quadrant of left breast in female, estrogen receptor positive (HCC)  12/26/2021 Genetic Testing   Negative.  The Common Hereditary Gene Panel offered by [facility] includes sequencing and/or deletion duplication testing of the following 47 genes: APC, ATM, AXIN2, BARD1, BMPR1A, BRCA1, BRCA2, BRIP1, CDH1, CDK4, CDKN2A (p14ARF), CDKN2A (p16INK4a), CHEK2, CTNNA1, DICER1, EPCAM (Deletion/duplication testing only), GREM1 (promoter region deletion/duplication testing only), KIT, MEN1, MLH1, MSH2, MSH3, MSH6, MUTYH, NBN, NF1, NHTL1, PALB2, PDGFRA, PMS2, POLD1, POLE, PTEN, RAD50, RAD51C, RAD51D, SDHB, SDHC, SDHD, SMAD4, SMARCA4. STK11, TP53, TSC1, TSC2, and VHL.  The following genes were evaluated for sequence changes only: SDHA and HOXB13 c.251G>A variant only.    12/31/2021 Initial Diagnosis   Malignant neoplasm of upper-inner quadrant of left breast in female, estrogen receptor positive (HCC)   12/31/2021 Cancer Staging   Staging form: Breast, AJCC 8th Edition - Clinical: Stage IIB (cT2, cN0, cM0, G3, ER+, PR-, HER2-) - Signed by Rachel Moulds, MD on 12/21/2023 Stage prefix: Initial diagnosis Histologic grading system: 3 grade system   01/08/2022 -  Chemotherapy   Completed 4 cycles of neoadjuvant TC, last cycle received on March 12, 2022     04/21/2022 Definitive Surgery   She had left breast lumpectomy with no residual carcinoma status post neoadjuvant therapy, treatment  related changes, no carcinoma identified in lymph nodes.  Final pathologic staging PT0N0 she also underwent bilateral mastopexy, benign breast with no evidence of carcinoma   05/28/2022 - 07/06/2022 Radiation Therapy   Site Technique Total Dose (Gy) Dose per Fx (Gy) Completed Fx Beam Energies  Breast, Left: Breast_L 3D 50.4/50.4 1.8 28/28 10XFFF     06/2022 -  Anti-estrogen oral therapy   Tamoxifen     CURRENT THERAPY: Tamoxifen daily  INTERVAL HISTORY: Maria Diaz 53 y.o. female is here today for follow up.  Discussed the use of AI scribe software for clinical note transcription with the patient, who gave verbal consent to proceed.  History of Present Illness    The patient, with breast cancer, presents for follow-up regarding treatment and management.  Diagnosed with breast cancer in 2023, she has undergone surgery and chemotherapy. Currently, she is on tamoxifen and tolerates it well without significant side effects. 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 No lingering side effects from chemotherapy are noted, and she is not experiencing lymphedema. Regular exercise is part of her routine, aiding in symptom management.  Recently started on Wegovy for prediabetes, she has lost approximately five pounds. The initial dose was 0.25 mg for four weeks, and she is now on her third injection of 0.5 mg.  Her last mammogram on December 14, 2023, was normal, with a recommendation for a follow-up in one year.    Rest of the pertinent 10 point ROS reviewed and negative  Patient Active Problem List   Diagnosis Date Noted   Anemia 12/31/2021   Malignant neoplasm of upper-inner quadrant of left breast  in female, estrogen receptor positive (HCC) 12/31/2021   Open angle with borderline findings and low glaucoma risk in both eyes 01/11/2016   Chronic idiopathic urticaria 01/11/2016   Age-related nuclear cataract of  both eyes 01/11/2016   Eczema 01/11/2016    is allergic to haemophilus influenzae vaccines and penicillins.  MEDICAL HISTORY: Past Medical History:  Diagnosis Date   Anemia    Anxiety    Depression    Glaucoma    History of radiation therapy    Left breast 05/28/22-07/06/22-Dr. Antony Blackbird   Hives    chronic, on Xolair injections    SURGICAL HISTORY: Past Surgical History:  Procedure Laterality Date   BREAST BIOPSY Left 01/19/2022   BREAST CYST EXCISION Left 01/28/2021   Procedure: LEFT BREAST MASS EXCISION;  Surgeon: Emelia Loron, MD;  Location: [facility];  Service: General;  Laterality: Left;  START TIME OF 3:00 PM FOR 60 MINUTES IN ROOM 8   BREAST LUMPECTOMY WITH RADIOACTIVE SEED AND SENTINEL LYMPH NODE BIOPSY Left 04/13/2022   Procedure: LEFT BREAST BRACKETED LUMPECTOMY WITH RADIOACTIVE SEED AND AXILLARY SENTINEL LYMPH NODE BIOPSY;  Surgeon: Emelia Loron, MD;  Location: [facility];  Service: General;  Laterality: Left;   HYSTEROSCOPY W/ ENDOMETRIAL ABLATION  08/14/2020   [facility]   MASTOPEXY Bilateral 04/21/2022   Procedure: MASTOPEXY;  Surgeon: Glenna Fellows, MD;  Location: [facility];  Service: Plastics;  Laterality: Bilateral;    SOCIAL HISTORY: Social History   Socioeconomic History   Marital status: Divorced    Spouse name: Not on file   Number of children: Not on file   Years of education: Not on file   Highest education level: Not on file  Occupational History   Not on file  Tobacco Use   Smoking status: Never   Smokeless tobacco: Never  Substance and Sexual Activity   Alcohol use: Yes    Alcohol/week: 1.0 standard drink of alcohol    Types: 1 Glasses of wine per week    Comment: holidays   Drug use: Never   Sexual activity: Yes    Birth control/protection: None    Comment: ablation  Other Topics Concern   Not on file  Social History Narrative   Not on file   Social Drivers of Health    Financial Resource Strain: Low Risk  (08/16/2023)   Received from [facility]   Overall Financial Resource Strain (CARDIA)    Difficulty of Paying Living Expenses: Not hard at all  Food Insecurity: No Food Insecurity (11/02/2023)   Received from [facility]   Hunger Vital Sign    Worried About Running Out of Food in the Last Year: Never true    Ran Out of Food in the Last Year: Never true  Transportation Needs: No Transportation Needs (11/02/2023)   Received from [facility] - Transportation    Lack of Transportation (Medical): No    Lack of Transportation (Non-Medical): No  Physical Activity: Insufficiently Active (11/02/2023)   Received from [facility]   Exercise Vital Sign    Days of Exercise per Week: 2 days    Minutes of Exercise per Session: 30 min  Stress: No Stress Concern Present (11/02/2023)   Received from [facility] - Occupational Stress Questionnaire    Feeling of Stress : Only a little  Social Connections: Socially Integrated (07/30/2022)   Received from [facility]  [facility] Care   Social Connection and Isolation Panel [NHANES]    Frequency of Communication with Friends and Family: Twice a week    Frequency of Social Gatherings with Friends and Family: Twice a week    Attends Religious Services: 1 to 4 times per year    Active Member of [employer] or Organizations: No    Attends Engineer, structural: 1 to 4 times per year    Marital Status: Married  Catering manager Violence: Not At Risk (07/30/2022)   Received from [facility]   Humiliation, Afraid, Rape, and Kick questionnaire    Fear of Current or Ex-Partner: No    Emotionally Abused: No    Physically Abused: No    Sexually Abused: No    FAMILY HISTORY: Family History  Problem Relation Age of Onset   Anxiety disorder Sister    Drug abuse Mother     Review of Systems  Constitutional:  Negative for  appetite change, chills, fatigue, fever and unexpected weight change.  HENT:   Negative for hearing loss, lump/mass and trouble swallowing.   Eyes:  Negative for eye problems and icterus.  Respiratory:  Negative for chest tightness, cough and shortness of breath.   Cardiovascular:  Negative for chest pain, leg swelling and palpitations.  Gastrointestinal:  Negative for abdominal distention, abdominal pain, constipation, diarrhea, nausea and vomiting.  Endocrine: Negative for hot flashes.  Genitourinary:  Negative for difficulty urinating.   Musculoskeletal:  Negative for arthralgias.  Skin:  Negative for itching and rash.  Neurological:  Negative for dizziness, extremity weakness, headaches and numbness.  Hematological:  Negative for adenopathy. Does not bruise/bleed easily.  Psychiatric/Behavioral:  Negative for depression. The patient is not nervous/anxious.       PHYSICAL EXAMINATION  ECOG PERFORMANCE STATUS: 1 - Symptomatic but completely ambulatory  Vitals:   12/21/23 0937  BP: (!) 103/59  Pulse: 80  Resp: 16  Temp: (!) 97.3 F (36.3 C)  SpO2: 98%     Physical Exam Constitutional:      General: She is not in acute distress.    Appearance: Normal appearance. She is not toxic-appearing.  HENT:     Head: Normocephalic and atraumatic.  Eyes:     General: No scleral icterus. Chest:     Comments: Bilateral breasts inspected and palpated. No palpable masses or regional adenopathy Abdominal:     General: Abdomen is flat. Bowel sounds are normal. There is no distension.     Palpations: Abdomen is soft.     Tenderness: There is no abdominal tenderness.  Musculoskeletal:        General: No swelling.     Cervical back: Neck supple.  Lymphadenopathy:     Cervical: No cervical adenopathy.  Skin:    General: Skin is warm and dry.     Findings: No rash.  Neurological:     General: No focal deficit present.     Mental Status: She is alert.  Psychiatric:        Mood and  Affect: Mood normal.        Behavior: Behavior normal.     LABORATORY DATA:  CBC    Component Value Date/Time   WBC 12.1 (H) 03/12/2022 0826   WBC 8.0 07/20/2020 2320   RBC 4.13 03/12/2022 0826   HGB 12.7 03/12/2022 0826   HCT 37.8 03/12/2022 0826   PLT 314 03/12/2022 0826   MCV 91.5 03/12/2022 0826   MCH 30.8 03/12/2022 0826  MCHC 33.6 03/12/2022 0826   RDW 14.4 03/12/2022 0826   LYMPHSABS 1.2 03/12/2022 0826   MONOABS 0.9 03/12/2022 0826   EOSABS 0.0 03/12/2022 0826   BASOSABS 0.0 03/12/2022 0826    CMP     Component Value Date/Time   NA 140 03/12/2022 0826   K 3.8 03/12/2022 0826   CL 111 03/12/2022 0826   CO2 24 03/12/2022 0826   GLUCOSE 107 (H) 03/12/2022 0826   BUN 15 03/12/2022 0826   CREATININE 0.71 03/12/2022 0826   CALCIUM 9.8 03/12/2022 0826   PROT 7.7 03/12/2022 0826   ALBUMIN 4.2 03/12/2022 0826   AST 11 (L) 03/12/2022 0826   ALT 9 03/12/2022 0826   ALKPHOS 71 03/12/2022 0826   BILITOT 0.3 03/12/2022 0826   GFRNONAA >60 03/12/2022 0826   GFRAA >60 07/20/2020 2320         ASSESSMENT and THERAPY PLAN:   Malignant neoplasm of upper-inner quadrant of left breast in female, estrogen receptor positive (HCC) This is a very pleasant 53 year old female patient with 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  She is doing well, recovering well from surgery. She completed radiation July 13, 2022. She is now on adj Tamoxifen  Breast Cancer In remission since 2023. Tolerating Tamoxifen well. No residual side effects from chemotherapy. Normal physical exam. Recent mammogram (12/14/2023) was normal. -Continue Tamoxifen. -Consider Guardant Reveal blood test for tumor DNA detection. Provided patient with pamphlet for further  information. -Next mammogram due in January 2026. -Follow-up in 6 months.  Prediabetes Recently started [facility] for weight management. Lost 5 pounds since starting the medication. -Continue Wegovy, currently at 0.5mg  dose. -Encourage regular exercise (150 minutes per week).  Lymphedema No current issues. -Continue regular exercise.   All questions were answered. The patient knows to call the clinic with any problems, questions or concerns. We can certainly see the patient much sooner if necessary.  Total encounter time:30 minutes*in face-to-face visit time, chart review, lab review, care coordination, order entry, and documentation of the encounter time.   *Total Encounter Time as defined by the Centers for Medicare and Medicaid Services includes, in addition to the face-to-face time of a patient visit (documented in the note above) non-face-to-face time: obtaining and reviewing outside history, ordering and reviewing medications, tests or procedures, care coordination (communications with other health care professionals or caregivers) and documentation in the medical record.

## 2023-12-21 NOTE — Assessment & Plan Note (Addendum)
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  She is doing well, recovering well from surgery. She completed radiation July 13, 2022. She is now on adj Tamoxifen  Breast Cancer In remission since 2023. Tolerating Tamoxifen well. No residual side effects from chemotherapy. Normal physical exam. Recent mammogram (12/14/2023) was normal. -Continue Tamoxifen. -Consider Guardant Reveal blood test for tumor DNA detection. Provided patient with pamphlet for further information. -Next mammogram due in January 2026. -Follow-up in 6 months.  Prediabetes Recently started [facility] for weight management. Lost 5 pounds since starting the medication. -Continue Wegovy, currently at 0.5mg  dose. -Encourage regular exercise (150 minutes per week).  Lymphedema No current issues. -Continue regular exercise.

## 2024-01-04 ENCOUNTER — Ambulatory Visit (INDEPENDENT_AMBULATORY_CARE_PROVIDER_SITE_OTHER): Payer: 59 | Admitting: Psychiatry

## 2024-01-04 DIAGNOSIS — F331 Major depressive disorder, recurrent, moderate: Secondary | ICD-10-CM

## 2024-01-04 DIAGNOSIS — F4310 Post-traumatic stress disorder, unspecified: Secondary | ICD-10-CM | POA: Diagnosis not present
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
## 2024-01-12 ENCOUNTER — Telehealth: Payer: 59 | Admitting: Psychiatry

## 2024-01-12 ENCOUNTER — Encounter: Payer: Self-pay | Admitting: Psychiatry

## 2024-01-12 DIAGNOSIS — F330 Major depressive disorder, recurrent, mild: Secondary | ICD-10-CM

## 2024-01-12 DIAGNOSIS — F4310 Post-traumatic stress disorder, unspecified: Secondary | ICD-10-CM | POA: Diagnosis not present

## 2024-01-12 DIAGNOSIS — G4700 Insomnia, unspecified: Secondary | ICD-10-CM

## 2024-01-12 MED ORDER — MIRTAZAPINE 30 MG PO TABS: 30.0000 mg | ORAL_TABLET | Freq: Every day | ORAL | 0 refills | Status: DC

## 2024-01-12 MED ORDER — TRAZODONE HCL 100 MG PO TABS: 50.0000 mg | ORAL_TABLET | Freq: Every day | ORAL | 0 refills | Status: DC

## 2024-01-12 MED ORDER — PRAZOSIN HCL 1 MG PO CAPS: 3.0000 mg | ORAL_CAPSULE | Freq: Every day | ORAL | 0 refills | Status: DC

## 2024-01-12 NOTE — Patient Instructions (Signed)
Continue sertraline 200 mg daily Continue mirtazapine 30 mg at night Continue hydroxyzine 25 mg daily as needed for anxiety  Continue prazosin 3 mg at night  Continue Trazodone 50-100 mg at night as needed for insomnia Next appointment- 4/9 at 11 am

## 2024-01-18 ENCOUNTER — Ambulatory Visit (INDEPENDENT_AMBULATORY_CARE_PROVIDER_SITE_OTHER): Payer: 59 | Admitting: Psychiatry

## 2024-01-18 DIAGNOSIS — F4310 Post-traumatic stress disorder, unspecified: Secondary | ICD-10-CM

## 2024-01-18 DIAGNOSIS — F330 Major depressive disorder, recurrent, mild: Secondary | ICD-10-CM
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
## 2024-02-10 ENCOUNTER — Ambulatory Visit (HOSPITAL_COMMUNITY): Payer: 59 | Admitting: Psychiatry

## 2024-02-10 DIAGNOSIS — F4310 Post-traumatic stress disorder, unspecified: Secondary | ICD-10-CM | POA: Diagnosis not present

## 2024-02-10 DIAGNOSIS — F330 Major depressive disorder, recurrent, mild: Secondary | ICD-10-CM | POA: Diagnosis not present
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
## 2024-02-13 ENCOUNTER — Other Ambulatory Visit: Payer: Self-pay | Admitting: Hematology and Oncology

## 2024-02-14 ENCOUNTER — Other Ambulatory Visit: Payer: Self-pay | Admitting: Psychiatry

## 2024-02-14 ENCOUNTER — Telehealth: Payer: Self-pay

## 2024-02-14 MED ORDER — HYDROXYZINE HCL 25 MG PO TABS: 25.0000 mg | ORAL_TABLET | Freq: Every day | ORAL | 0 refills | Status: DC | PRN

## 2024-02-14 NOTE — Telephone Encounter (Signed)
 ordered

## 2024-02-14 NOTE — Telephone Encounter (Signed)
 received fax requesting a refill on the hydroxyzine. pt was last seen on 2-19 next appt 4-9

## 2024-02-14 NOTE — Telephone Encounter (Signed)
Pt notified that rx sent to the pharmacy. 

## 2024-02-21 ENCOUNTER — Encounter: Payer: Self-pay | Admitting: Adult Health

## 2024-02-22 ENCOUNTER — Inpatient Hospital Stay: Attending: Hematology and Oncology | Admitting: Adult Health

## 2024-02-22 ENCOUNTER — Telehealth: Payer: Self-pay | Admitting: *Deleted

## 2024-02-22 VITALS — BP 113/64 | HR 79 | Temp 97.7°F | Resp 17 | Wt 208.8 lb

## 2024-02-22 DIAGNOSIS — C50212 Malignant neoplasm of upper-inner quadrant of left female breast: Secondary | ICD-10-CM | POA: Diagnosis not present

## 2024-02-22 DIAGNOSIS — Z170 Estrogen receptor positive status [ER+]: Secondary | ICD-10-CM | POA: Insufficient documentation

## 2024-02-22 DIAGNOSIS — N63 Unspecified lump in unspecified breast: Secondary | ICD-10-CM

## 2024-02-22 DIAGNOSIS — Z79810 Long term (current) use of selective estrogen receptor modulators (SERMs): Secondary | ICD-10-CM | POA: Diagnosis not present

## 2024-02-22 NOTE — Progress Notes (Signed)
 [facility] Cancer Follow up:    Maria Lunger, DO [address] [place] [address]   DIAGNOSIS:  Cancer Staging  Malignant neoplasm of upper-inner quadrant of left breast in female, estrogen receptor positive (HCC) Staging form: Breast, AJCC 8th Edition - Clinical: Stage IIB (cT2, cN0, cM0, G3, ER+, PR-, HER2-) - Signed by Maria Moulds, MD on 12/21/2023 Stage prefix: Initial diagnosis Histologic grading system: 3 grade system   SUMMARY OF ONCOLOGIC HISTORY: Oncology History  Malignant neoplasm of upper-inner quadrant of left breast in female, estrogen receptor positive (HCC)  12/26/2021 Genetic Testing   Negative.  The Common Hereditary Gene Panel offered by [facility] includes sequencing and/or deletion duplication testing of the following 47 genes: APC, ATM, AXIN2, BARD1, BMPR1A, BRCA1, BRCA2, BRIP1, CDH1, CDK4, CDKN2A (p14ARF), CDKN2A (p16INK4a), CHEK2, CTNNA1, DICER1, EPCAM (Deletion/duplication testing only), GREM1 (promoter region deletion/duplication testing only), KIT, MEN1, MLH1, MSH2, MSH3, MSH6, MUTYH, NBN, NF1, NHTL1, PALB2, PDGFRA, PMS2, POLD1, POLE, PTEN, RAD50, RAD51C, RAD51D, SDHB, SDHC, SDHD, SMAD4, SMARCA4. STK11, TP53, TSC1, TSC2, and VHL.  The following genes were evaluated for sequence changes only: SDHA and HOXB13 c.251G>A variant only.    12/31/2021 Initial Diagnosis   Malignant neoplasm of upper-inner quadrant of left breast in female, estrogen receptor positive (HCC)   12/31/2021 Cancer Staging   Staging form: Breast, AJCC 8th Edition - Clinical: Stage IIB (cT2, cN0, cM0, G3, ER+, PR-, HER2-) - Signed by Maria Moulds, MD on 12/21/2023 Stage prefix: Initial diagnosis Histologic grading system: 3 grade system   01/08/2022 -  Chemotherapy   Completed 4 cycles of neoadjuvant TC, last cycle received on March 12, 2022     04/21/2022 Definitive Surgery   She had left breast lumpectomy with no residual carcinoma status post neoadjuvant therapy, treatment  related changes, no carcinoma identified in lymph nodes.  Final pathologic staging PT0N0 she also underwent bilateral mastopexy, benign breast with no evidence of carcinoma   05/28/2022 - 07/06/2022 Radiation Therapy   Site Technique Total Dose (Gy) Dose per Fx (Gy) Completed Fx Beam Energies  Breast, Left: Breast_L 3D 50.4/50.4 1.8 28/28 10XFFF     06/2022 -  Anti-estrogen oral therapy   Tamoxifen     CURRENT THERAPY: Tamoxifen  INTERVAL HISTORY:  Discussed the use of AI scribe software for clinical note transcription with the patient, who gave verbal consent to proceed.  Maria Diaz 53 y.o. female with a history of stage 2B left breast ER positive breast cancer status post neoadjuvant TC followed by left breast lumpectomy and radiation in August of 2023, presents with a left breast nodule and pain for over a week. She describes the area as sore and palpable. The patient's most recent mammogram in January did not reveal any abnormalities, however, she has breast density category C, which can sometimes obscure small lesions. She is currently on tamoxifen for her breast cancer and reports tolerating it well.   Patient Active Problem List   Diagnosis Date Noted   Anemia 12/31/2021   Malignant neoplasm of upper-inner quadrant of left breast in female, estrogen receptor positive (HCC) 12/31/2021   Open angle with borderline findings and low glaucoma risk in both eyes 01/11/2016   Chronic idiopathic urticaria 01/11/2016   Age-related nuclear cataract of both eyes 01/11/2016   Eczema 01/11/2016    is allergic to haemophilus influenzae vaccines and penicillins.  MEDICAL HISTORY: Past Medical History:  Diagnosis Date   Anemia    Anxiety    Depression  Glaucoma    History of radiation therapy    Left breast 05/28/22-07/06/22-Dr. Antony Diaz   Hives    chronic, on Xolair injections    SURGICAL HISTORY: Past Surgical History:  Procedure Laterality Date   BREAST BIOPSY Left  01/19/2022   BREAST CYST EXCISION Left 01/28/2021   Procedure: LEFT BREAST MASS EXCISION;  Surgeon: Maria Loron, MD;  Location: [facility];  Service: General;  Laterality: Left;  START TIME OF 3:00 PM FOR 60 MINUTES IN ROOM 8   BREAST LUMPECTOMY WITH RADIOACTIVE SEED AND SENTINEL LYMPH NODE BIOPSY Left 04/13/2022   Procedure: LEFT BREAST BRACKETED LUMPECTOMY WITH RADIOACTIVE SEED AND AXILLARY SENTINEL LYMPH NODE BIOPSY;  Surgeon: Maria Loron, MD;  Location: [facility];  Service: General;  Laterality: Left;   HYSTEROSCOPY W/ ENDOMETRIAL ABLATION  08/14/2020   [facility]   MASTOPEXY Bilateral 04/21/2022   Procedure: MASTOPEXY;  Surgeon: Maria Fellows, MD;  Location: [facility];  Service: Plastics;  Laterality: Bilateral;    SOCIAL HISTORY: Social History   Socioeconomic History   Marital status: Divorced    Spouse name: Not on file   Number of children: Not on file   Years of education: Not on file   Highest education level: Not on file  Occupational History   Not on file  Tobacco Use   Smoking status: Never   Smokeless tobacco: Never  Substance and Sexual Activity   Alcohol use: Yes    Alcohol/week: 1.0 standard drink of alcohol    Types: 1 Glasses of wine per week    Comment: holidays   Drug use: Never   Sexual activity: Yes    Birth control/protection: None    Comment: ablation  Other Topics Concern   Not on file  Social History Narrative   Not on file   Social Drivers of Health   Financial Resource Strain: Low Risk  (08/16/2023)   Received from [facility]   Overall Financial Resource Strain (CARDIA)    Difficulty of Paying Living Expenses: Not hard at all  Food Insecurity: No Food Insecurity (11/02/2023)   Received from [facility]   Hunger Vital Sign    Worried About Running Out of Food in the Last Year: Never true    Ran Out of Food in the Last Year: Never true  Transportation Needs: No Transportation  Needs (11/02/2023)   Received from [facility] - Transportation    Lack of Transportation (Medical): No    Lack of Transportation (Non-Medical): No  Physical Activity: Insufficiently Active (11/02/2023)   Received from [facility]   Exercise Vital Sign    Days of Exercise per Week: 2 days    Minutes of Exercise per Session: 30 min  Stress: No Stress Concern Present (11/02/2023)   Received from [facility] - Occupational Stress Questionnaire    Feeling of Stress : Only a little  Social Connections: Socially Integrated (07/30/2022)   Received from [facility], [facility]   Social Connection and Isolation Panel [NHANES]    Frequency of Communication with Friends and Family: Twice a week    Frequency of Social Gatherings with Friends and Family: Twice a week    Attends Religious Services: 1 to 4 times per year    Active Member of [employer] or Organizations: No    Attends Engineer, structural: 1 to 4 times per year  Marital Status: Married  Catering manager Violence: Not At Risk (07/30/2022)   Received from [facility], [facility]   Humiliation, Afraid, Rape, and Kick questionnaire    Fear of Current or Ex-Partner: No    Emotionally Abused: No    Physically Abused: No    Sexually Abused: No    FAMILY HISTORY: Family History  Problem Relation Age of Onset   Anxiety disorder Sister    Drug abuse Mother     Review of Systems  Constitutional:  Negative for appetite change, chills, fatigue, fever and unexpected weight change.  HENT:   Negative for hearing loss, lump/mass and trouble swallowing.   Eyes:  Negative for eye problems and icterus.  Respiratory:  Negative for chest tightness, cough and shortness of breath.   Cardiovascular:  Negative for chest pain, leg swelling and palpitations.  Gastrointestinal:  Negative for abdominal distention, abdominal pain, constipation, diarrhea, nausea and  vomiting.  Endocrine: Negative for hot flashes.  Genitourinary:  Negative for difficulty urinating.   Musculoskeletal:  Negative for arthralgias.  Skin:  Negative for itching and rash.  Neurological:  Negative for dizziness, extremity weakness, headaches and numbness.  Hematological:  Negative for adenopathy. Does not bruise/bleed easily.  Psychiatric/Behavioral:  Negative for depression. The patient is not nervous/anxious.       PHYSICAL EXAMINATION   Onc Performance Status - 02/22/24 1017       ECOG Perf Status   ECOG Perf Status Fully active, able to carry on all pre-disease performance without restriction      KPS SCALE   KPS % SCORE Able to carry on normal activity, minor s/s of disease             Vitals:   02/22/24 1016  BP: 113/64  Pulse: 79  Resp: 17  Temp: 97.7 F (36.5 C)  SpO2: 98%    Physical Exam Constitutional:      General: She is not in acute distress.    Appearance: Normal appearance. She is not toxic-appearing.  HENT:     Head: Normocephalic and atraumatic.  Eyes:     General: No scleral icterus. Chest:     Comments: Left breast: 1-2 cm breast nodule, 3cmfn at 2-3 oclock, palpated deep in breast tissue towards axillary tail, right breast benign Musculoskeletal:        General: No swelling.  Lymphadenopathy:     Upper Body:     Right upper body: No supraclavicular or axillary adenopathy.     Left upper body: No supraclavicular or axillary adenopathy.  Skin:    General: Skin is warm and dry.     Findings: No rash.  Neurological:     General: No focal deficit present.     Mental Status: She is alert.  Psychiatric:        Mood and Affect: Mood normal.        Behavior: Behavior normal.        ASSESSMENT and THERAPY PLAN:   Malignant neoplasm of upper-inner quadrant of left breast in female, estrogen receptor positive (HCC) Left breast nodule and pain Maria Diaz presents with a left breast nodule and associated pain persisting  for over a week. She has stage 2B left breast ER positive breast cancer, status post neoadjuvant chemo with taxotere/cytoxan, left breast lumpectomy, and radiation therapy, and antiestrogen therapy with Tamoxifen. Her recent mammogram in January showed breast density category C, which can obscure small lesions. The current symptoms warrant further investigation to rule out  recurrence or other pathology. - Order urgent ultrasound and mammogram of the left breast - Fax orders to [facility] for scheduling within the week  ER positive breast cancer Stage 2B ER positive breast cancer in the left breast, treated with neoadjuvant TC, lumpectomy, and radiation. Currently on tamoxifen therapy, which she is tolerating well. - Continue tamoxifen therapy  RTC 05/2024 as scheduled with Dr. Al Pimple, or sooner if needed   All questions were answered. The patient knows to call the clinic with any problems, questions or concerns. We can certainly see the patient much sooner if necessary.  Total encounter time:20 minutes*in face-to-face visit time, chart review, lab review, care coordination, order entry, and documentation of the encounter time.    Lillard Anes, NP 02/22/24 12:53 PM Medical Oncology and Hematology [facility] [address] [place], [place] [address] Tel. [phone]    Fax. [phone]  *Total Encounter Time as defined by the Centers for Medicare and Medicaid Services includes, in addition to the face-to-face time of a patient visit (documented in the note above) non-face-to-face time: obtaining and reviewing outside history, ordering and reviewing medications, tests or procedures, care coordination (communications with other health care professionals or caregivers) and documentation in the medical record.

## 2024-02-22 NOTE — Assessment & Plan Note (Signed)
 Left breast nodule and pain Maria Diaz presents with a left breast nodule and associated pain persisting for over a week. She has stage 2B left breast ER positive breast cancer, status post neoadjuvant chemo with taxotere/cytoxan, left breast lumpectomy, and radiation therapy, and antiestrogen therapy with Tamoxifen. Her recent mammogram in January showed breast density category C, which can obscure small lesions. The current symptoms warrant further investigation to rule out recurrence or other pathology. - Order urgent ultrasound and mammogram of the left breast - Fax orders to [facility] for scheduling within the week  ER positive breast cancer Stage 2B ER positive breast cancer in the left breast, treated with neoadjuvant TC, lumpectomy, and radiation. Currently on tamoxifen therapy, which she is tolerating well. - Continue tamoxifen therapy  RTC 05/2024 as scheduled with Dr. Al Pimple, or sooner if needed

## 2024-02-22 NOTE — Telephone Encounter (Signed)
 Orders faxed to [facility]. Confirmation received

## 2024-02-23 ENCOUNTER — Ambulatory Visit (INDEPENDENT_AMBULATORY_CARE_PROVIDER_SITE_OTHER): Payer: 59 | Admitting: Psychiatry

## 2024-02-23 DIAGNOSIS — F330 Major depressive disorder, recurrent, mild: Secondary | ICD-10-CM | POA: Diagnosis not present

## 2024-02-23 DIAGNOSIS — F4310 Post-traumatic stress disorder, unspecified: Secondary | ICD-10-CM | POA: Diagnosis not present
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
## 2024-02-25 ENCOUNTER — Encounter: Payer: Self-pay | Admitting: Adult Health
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
## 2024-03-01 ENCOUNTER — Encounter: Payer: Self-pay | Admitting: Psychiatry

## 2024-03-01 ENCOUNTER — Telehealth (INDEPENDENT_AMBULATORY_CARE_PROVIDER_SITE_OTHER): Payer: 59 | Admitting: Psychiatry

## 2024-03-01 DIAGNOSIS — G4700 Insomnia, unspecified: Secondary | ICD-10-CM

## 2024-03-01 DIAGNOSIS — F330 Major depressive disorder, recurrent, mild: Secondary | ICD-10-CM | POA: Diagnosis not present

## 2024-03-01 DIAGNOSIS — F4310 Post-traumatic stress disorder, unspecified: Secondary | ICD-10-CM | POA: Diagnosis not present

## 2024-03-01 MED ORDER — MIRTAZAPINE 30 MG PO TABS: 30.0000 mg | ORAL_TABLET | Freq: Every day | ORAL | 0 refills | Status: DC

## 2024-03-01 NOTE — Patient Instructions (Signed)
 Continue sertraline 200 mg daily Continue mirtazapine 30 mg at night Continue hydroxyzine 25 mg daily as needed for anxiety  Continue prazosin 3 mg at night  Continue Trazodone 100 mg at night as needed for insomnia Next appointment- 5/23 at 10  am

## 2024-03-03 NOTE — Patient Instructions (Signed)

## 2024-03-08 ENCOUNTER — Ambulatory Visit: Payer: Self-pay | Admitting: Nurse Practitioner

## 2024-03-08 ENCOUNTER — Encounter: Payer: Self-pay | Admitting: Nurse Practitioner

## 2024-03-08 VITALS — BP 90/60 | HR 97 | Ht 67.0 in | Wt 206.4 lb

## 2024-03-08 DIAGNOSIS — E063 Autoimmune thyroiditis: Secondary | ICD-10-CM | POA: Diagnosis not present

## 2024-03-08 NOTE — Progress Notes (Signed)
 Endocrinology Consult Note                                         03/08/2024, 10:28 AM  Subjective:   Subjective    Maria Diaz is a 53 y.o.-year-old female patient being seen in consultation for hypothyroidism referred by Avelina Bode, DO.   Past Medical History:  Diagnosis Date   Anemia    Anxiety    Depression    Glaucoma    History of radiation therapy    Left breast 05/28/22-07/06/22-Dr. Retta Caster   Hives    chronic, on Xolair injections    Past Surgical History:  Procedure Laterality Date   BREAST BIOPSY Left 01/19/2022   BREAST CYST EXCISION Left 01/28/2021   Procedure: LEFT BREAST MASS EXCISION;  Surgeon: Enid Harry, MD;  Location: [facility];  Service: General;  Laterality: Left;  START TIME OF 3:00 PM FOR 60 MINUTES IN ROOM 8   BREAST LUMPECTOMY WITH RADIOACTIVE SEED AND SENTINEL LYMPH NODE BIOPSY Left 04/13/2022   Procedure: LEFT BREAST BRACKETED LUMPECTOMY WITH RADIOACTIVE SEED AND AXILLARY SENTINEL LYMPH NODE BIOPSY;  Surgeon: Enid Harry, MD;  Location: [facility];  Service: General;  Laterality: Left;   HYSTEROSCOPY W/ ENDOMETRIAL ABLATION  08/14/2020   [facility]   MASTOPEXY Bilateral 04/21/2022   Procedure: MASTOPEXY;  Surgeon: Alger Infield, MD;  Location: [facility];  Service: Plastics;  Laterality: Bilateral;    Social History   Socioeconomic History   Marital status: Divorced    Spouse name: Not on file   Number of children: Not on file   Years of education: Not on file   Highest education level: Not on file  Occupational History   Not on file  Tobacco Use   Smoking status: Never   Smokeless tobacco: Never  Substance and Sexual Activity   Alcohol use: Yes    Alcohol/week: 1.0 standard drink of alcohol    Types: 1 Glasses of wine per week    Comment: holidays   Drug use: Never   Sexual activity: Yes    Birth  control/protection: None    Comment: ablation  Other Topics Concern   Not on file  Social History Narrative   Not on file   Social Drivers of Health   Financial Resource Strain: Low Risk  (08/16/2023)   Received from [facility]   Overall Financial Resource Strain (CARDIA)    Difficulty of Paying Living Expenses: Not hard at all  Food Insecurity: No Food Insecurity (11/02/2023)   Received from [facility]   Hunger Vital Sign    Worried About Running Out of Food in the Last Year: Never true    Ran Out of Food in the Last Year: Never true  Transportation Needs: No Transportation Needs (11/02/2023)   Received from [facility] - Transportation    Lack of Transportation (Medical): No    Lack of Transportation (Non-Medical): No  Physical Activity: Insufficiently  Active (11/02/2023)   Received from [facility]   Exercise Vital Sign    Days of Exercise per Week: 2 days    Minutes of Exercise per Session: 30 min  Stress: No Stress Concern Present (11/02/2023)   Received from [facility] - Occupational Stress Questionnaire    Feeling of Stress : Only a little  Social Connections: Socially Integrated (07/30/2022)   Received from [facility] : [facility]   Social Connection and Isolation Panel [NHANES]    Frequency of Communication with Friends and Family: Twice a week    Frequency of Social Gatherings with Friends and Family: Twice a week    Attends Religious Services: 1 to 4 times per year    Active Member of [employer] or Organizations: No    Attends Engineer, structural: 1 to 4 times per year    Marital Status: Married    Family History  Problem Relation Age of Onset   Anxiety disorder Sister    Drug abuse Mother     Outpatient Encounter Medications as of 03/08/2024  Medication Sig   atorvastatin (LIPITOR) 10 MG tablet Take 10 mg by mouth daily.   EPINEPHrine 0.3 mg/0.3 mL IJ SOAJ injection  Inject 0.3 mg into the muscle as needed. For severe allergy   fluticasone (FLONASE) 50 MCG/ACT nasal spray Place 2 sprays into the nose daily as needed.   hydrOXYzine (ATARAX) 25 MG tablet Take 1 tablet (25 mg total) by mouth daily as needed for anxiety.   levothyroxine (SYNTHROID) 25 MCG tablet Take 25 mcg by mouth daily before breakfast.   meloxicam (MOBIC) 15 MG tablet Take 15 mg by mouth daily.   [START ON 04/11/2024] mirtazapine (REMERON) 30 MG tablet Take 1 tablet (30 mg total) by mouth at bedtime.   prazosin (MINIPRESS) 1 MG capsule Take 3 capsules (3 mg total) by mouth at bedtime.   Semaglutide-Weight Management (WEGOVY) 0.5 MG/0.5ML SOAJ Inject 0.5 mg into the skin once a week.   sertraline (ZOLOFT) 100 MG tablet Take 2 tablets (200 mg total) by mouth daily.   tamoxifen (NOLVADEX) 20 MG tablet TAKE 1 TABLET BY MOUTH EVERY DAY   traZODone (DESYREL) 100 MG tablet Take 0.5-1 tablets (50-100 mg total) by mouth at bedtime.   No facility-administered encounter medications on file as of 03/08/2024.    ALLERGIES: Allergies  Allergen Reactions   Haemophilus Influenzae Vaccines Hives   Penicillins Hives   VACCINATION STATUS: Immunization History  Administered Date(s) Administered   Moderna Sars-Covid-2 Vaccination 06/24/2020, 07/22/2020, 12/30/2020   Tdap 05/10/2014     HPI   Maria Diaz  is a patient with the above medical history. she was diagnosed with hypothyroidism at approximate age of 61 years (around December of 2024), which required subsequent initiation of thyroid hormone replacement therapy. she was given Levothyroxine 25 micrograms. she reports compliance to this medication:  but she does note she takes it with her other morning medications.  I reviewed patient's thyroid tests:  Lab Results  Component Value Date   TSH 0.35 (A) 11/03/2023   TSH 0.53 08/03/2023   TSH 0.582 07/20/2020     Pt describes: - fatigue - cold intolerance - depression - constipation -  dry skin - joint aches  Pt denies feeling nodules in neck, hoarseness, dysphagia/odynophagia, SOB with lying down.  she denies any known family history of thyroid disorders.  No known family history of thyroid cancer.  No  history of radiation therapy to head or neck- she did have radiation treatment for breast cancer in the past but was limited to axilla lymph nodes.  No recent use of iodine supplements.  Denies use of Biotin containing supplements.  I reviewed her chart and she also has a history of preDM, obesity (currently on Wegovy).   ROS:  Constitutional: no weight gain/loss, + fatigue, no subjective hyperthermia, + subjective hypothermia Eyes: no blurry vision, no xerophthalmia ENT: no sore throat, no nodules palpated in throat, no dysphagia/odynophagia, no hoarseness Cardiovascular: no chest pain, no SOB, no palpitations, no leg swelling Respiratory: no cough, no SOB Gastrointestinal: no nausea/vomiting/diarrhea, + constipation Musculoskeletal: + diffuse muscle/joint aches Skin: no rashes Neurological: no tremors, no numbness, no tingling, no dizziness Psychiatric: + depression, no anxiety   Objective:   Objective     BP 90/60 (BP Location: Right Arm, Patient Position: Sitting, Cuff Size: Large)   Pulse 97   Ht 5\' 7"  (1.702 m)   Wt 206 lb 6.4 oz (93.6 kg)   BMI 32.33 kg/m  Wt Readings from Last 3 Encounters:  03/08/24 206 lb 6.4 oz (93.6 kg)  02/22/24 208 lb 12.8 oz (94.7 kg)  12/21/23 218 lb 9.6 oz (99.2 kg)    BP Readings from Last 3 Encounters:  03/08/24 90/60  02/22/24 113/64  12/21/23 (!) 103/59     Constitutional:  Body mass index is 32.33 kg/m., not in acute distress, normal state of mind Eyes: PERRLA, EOMI, no exophthalmos ENT: moist mucous membranes, no thyromegaly, no palpable nodularity, no cervical lymphadenopathy Cardiovascular: normal precordial activity, RRR, no murmur/rubs/gallops Respiratory:  adequate breathing efforts, no gross chest  deformity, Clear to auscultation bilaterally Musculoskeletal: no gross deformities, strength intact in all four extremities Skin: moist, warm, no rashes Neurological: no tremor with outstretched hands, deep tendon reflexes normal in BLE.   CMP ( most recent) CMP     Component Value Date/Time   NA 140 03/12/2022 0826   K 3.8 03/12/2022 0826   CL 111 03/12/2022 0826   CO2 24 03/12/2022 0826   GLUCOSE 107 (H) 03/12/2022 0826   BUN 15 03/12/2022 0826   CREATININE 0.71 03/12/2022 0826   CALCIUM 9.8 03/12/2022 0826   PROT 7.7 03/12/2022 0826   ALBUMIN 4.2 03/12/2022 0826   AST 11 (L) 03/12/2022 0826   ALT 9 03/12/2022 0826   ALKPHOS 71 03/12/2022 0826   BILITOT 0.3 03/12/2022 0826   GFRNONAA >60 03/12/2022 0826     Diabetic Labs (most recent): Lab Results  Component Value Date   HGBA1C 5.6 07/20/2020     Lipid Panel ( most recent) Lipid Panel     Component Value Date/Time   CHOL 193 07/20/2020 2320   TRIG 60 07/20/2020 2320   HDL 52 07/20/2020 2320   CHOLHDL 3.7 07/20/2020 2320   VLDL 12 07/20/2020 2320   LDLCALC 129 (H) 07/20/2020 2320       Lab Results  Component Value Date   TSH 0.35 (A) 11/03/2023   TSH 0.53 08/03/2023   TSH 0.582 07/20/2020      Assessment & Plan:   ASSESSMENT / PLAN:  1. Hypothyroidism- r/t Hashimoto's thyroiditis  Patient with long-standing hypothyroidism, on levothyroxine therapy.  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  On physical exam, patient does not have gross goiter, thyroid nodules, or neck compression symptoms.  She is advised to continue Levothyroxine 25 mcg po daily before breakfast but to start taking it properly.  We did  talk about potential for [facility] interrupting proper absorption of her thyroid medication and ways to prevent such interference.  - We discussed about correct intake of levothyroxine, at fasting, with water, separated by at least 30 minutes from breakfast, and  separated by more than 4 hours from calcium, iron, multivitamins, acid reflux medications (PPIs). -Patient is made aware of the fact that thyroid hormone replacement is needed for life, dose to be adjusted by periodic monitoring of thyroid function tests.  - Will check thyroid tests before next visit: TSH, free T4  -Due to absence of clinical goiter, no need for thyroid ultrasound.  - Time spent with the patient: 45 minutes, of which >50% was spent in obtaining information about her symptoms, reviewing her previous labs, evaluations, and treatments, counseling her about her hypothyroidism, and developing a plan to confirm the diagnosis and long term treatment as necessary. Please refer to "Patient Self Inventory" in the Media tab for reviewed elements of pertinent patient history.  Maria Diaz participated in the discussions, expressed understanding, and voiced agreement with the above plans.  All questions were answered to her satisfaction. she is encouraged to contact clinic should she have any questions or concerns prior to her return visit.   FOLLOW UP PLAN:  Return in about 8 weeks (around 05/03/2024) for Thyroid follow up, Previsit labs.  Hulon Magic, [facility] [address] [place], [place] [address] Phone: [phone] Fax: [phone]  03/08/2024, 10:28 AM

## 2024-03-09 ENCOUNTER — Ambulatory Visit (HOSPITAL_COMMUNITY): Payer: 59 | Admitting: Psychiatry

## 2024-03-09 DIAGNOSIS — F4310 Post-traumatic stress disorder, unspecified: Secondary | ICD-10-CM

## 2024-03-09 DIAGNOSIS — F329 Major depressive disorder, single episode, unspecified: Secondary | ICD-10-CM
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
## 2024-03-23 ENCOUNTER — Ambulatory Visit (HOSPITAL_COMMUNITY): Payer: 59 | Admitting: Psychiatry

## 2024-03-23 DIAGNOSIS — F329 Major depressive disorder, single episode, unspecified: Secondary | ICD-10-CM | POA: Diagnosis not present

## 2024-03-23 DIAGNOSIS — F4310 Post-traumatic stress disorder, unspecified: Secondary | ICD-10-CM | POA: Diagnosis not present

## 2024-03-23 DIAGNOSIS — F330 Major depressive disorder, recurrent, mild: Secondary | ICD-10-CM
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
## 2024-04-14 ENCOUNTER — Telehealth (INDEPENDENT_AMBULATORY_CARE_PROVIDER_SITE_OTHER): Admitting: Psychiatry

## 2024-04-14 ENCOUNTER — Encounter: Payer: Self-pay | Admitting: Psychiatry

## 2024-04-14 DIAGNOSIS — G4700 Insomnia, unspecified: Secondary | ICD-10-CM | POA: Diagnosis not present

## 2024-04-14 DIAGNOSIS — E039 Hypothyroidism, unspecified: Secondary | ICD-10-CM | POA: Diagnosis not present

## 2024-04-14 DIAGNOSIS — F4310 Post-traumatic stress disorder, unspecified: Secondary | ICD-10-CM

## 2024-04-14 DIAGNOSIS — F330 Major depressive disorder, recurrent, mild: Secondary | ICD-10-CM | POA: Diagnosis not present

## 2024-04-14 NOTE — Patient Instructions (Signed)
 Continue sertraline  200 mg daily Continue mirtazapine  30 mg at night Continue hydroxyzine  25 mg daily as needed for anxiety  Continue prazosin  3 mg at night  Continue Trazodone  100 mg at night as needed for insomnia Next appointment- 6/24 at 9 am

## 2024-04-25 NOTE — Patient Instructions (Signed)

## 2024-04-26 ENCOUNTER — Encounter: Payer: Self-pay | Admitting: Nurse Practitioner

## 2024-04-26 ENCOUNTER — Ambulatory Visit: Admitting: Nurse Practitioner

## 2024-04-26 VITALS — BP 98/60 | HR 82 | Ht 68.0 in | Wt 195.0 lb

## 2024-04-26 DIAGNOSIS — E063 Autoimmune thyroiditis: Secondary | ICD-10-CM

## 2024-04-26 LAB — T4, FREE: Free T4: 0.92 ng/dL (ref 0.82–1.77)

## 2024-04-26 LAB — TSH: TSH: 0.52 u[IU]/mL (ref 0.450–4.500)

## 2024-04-26 MED ORDER — LEVOTHYROXINE SODIUM 50 MCG PO TABS: 50.0000 ug | ORAL_TABLET | Freq: Every day | ORAL | 1 refills | Status: DC

## 2024-04-26 NOTE — Progress Notes (Signed)
 Endocrinology Follow Up Note                                         04/26/2024, 11:10 AM  Subjective:   Subjective    Maria Diaz is a 53 y.o.-year-old female patient being seen in follow up after being seen in consultation for hypothyroidism referred by Avelina Bode, DO.   Past Medical History:  Diagnosis Date   Anemia    Anxiety    Depression    Glaucoma    History of radiation therapy    Left breast 05/28/22-07/06/22-Dr. Retta Caster   Hives    chronic, on Xolair injections    Past Surgical History:  Procedure Laterality Date   BREAST BIOPSY Left 01/19/2022   BREAST CYST EXCISION Left 01/28/2021   Procedure: LEFT BREAST MASS EXCISION;  Surgeon: Enid Harry, MD;  Location: [facility];  Service: General;  Laterality: Left;  START TIME OF 3:00 PM FOR 60 MINUTES IN ROOM 8   BREAST LUMPECTOMY WITH RADIOACTIVE SEED AND SENTINEL LYMPH NODE BIOPSY Left 04/13/2022   Procedure: LEFT BREAST BRACKETED LUMPECTOMY WITH RADIOACTIVE SEED AND AXILLARY SENTINEL LYMPH NODE BIOPSY;  Surgeon: Enid Harry, MD;  Location: [facility];  Service: General;  Laterality: Left;   HYSTEROSCOPY W/ ENDOMETRIAL ABLATION  08/14/2020   [facility]   MASTOPEXY Bilateral 04/21/2022   Procedure: MASTOPEXY;  Surgeon: Alger Infield, MD;  Location: [facility];  Service: Plastics;  Laterality: Bilateral;    Social History   Socioeconomic History   Marital status: Divorced    Spouse name: Not on file   Number of children: Not on file   Years of education: Not on file   Highest education level: Not on file  Occupational History   Not on file  Tobacco Use   Smoking status: Never   Smokeless tobacco: Never  Substance and Sexual Activity   Alcohol use: Yes    Alcohol/week: 1.0 standard drink of alcohol    Types: 1 Glasses of wine per week    Comment: holidays   Drug use: Never    Sexual activity: Yes    Birth control/protection: None    Comment: ablation  Other Topics Concern   Not on file  Social History Narrative   Not on file   Social Drivers of Health   Financial Resource Strain: Low Risk  (08/16/2023)   [facility]   Overall Financial Resource Strain (CARDIA)    Difficulty of Paying Living Expenses: Not hard at all  Food Insecurity: No Food Insecurity (11/02/2023)   Received from [facility]   Hunger Vital Sign    Worried About Running Out of Food in the Last Year: Never true    Ran Out of Food in the Last Year: Never true  Transportation Needs: No Transportation Needs (11/02/2023)   Received from [facility] - Transportation    Lack of Transportation (Medical): No    Lack of  Transportation (Non-Medical): No  Physical Activity: Insufficiently Active (11/02/2023)   Received from [facility]   Exercise Vital Sign    Days of Exercise per Week: 2 days    Minutes of Exercise per Session: 30 min  Stress: No Stress Concern Present (11/02/2023)   Received from [facility] - Occupational Stress Questionnaire    Feeling of Stress : Only a little  Social Connections: Socially Integrated (07/30/2022)   Received from [facility]   Social Connection and Isolation Panel [NHANES]    Frequency of Communication with Friends and Family: Twice a week    Frequency of Social Gatherings with Friends and Family: Twice a week    Attends Religious Services: 1 to 4 times per year    Active Member of [employer] or Organizations: No    Attends Engineer, structural: 1 to 4 times per year    Marital Status: Married    Family History  Problem Relation Age of Onset   Anxiety disorder Sister    Drug abuse Mother     Outpatient Encounter Medications as of 04/26/2024  Medication Sig   atorvastatin (LIPITOR) 10 MG tablet Take 10 mg by mouth daily.   EPINEPHrine  0.3  mg/0.3 mL IJ SOAJ injection Inject 0.3 mg into the muscle as needed. For severe allergy   fluticasone (FLONASE) 50 MCG/ACT nasal spray Place 2 sprays into the nose daily as needed.   hydrOXYzine  (ATARAX ) 25 MG tablet Take 1 tablet (25 mg total) by mouth daily as needed for anxiety.   mirtazapine  (REMERON ) 30 MG tablet Take 1 tablet (30 mg total) by mouth at bedtime.   prazosin  (MINIPRESS ) 1 MG capsule Take 3 capsules (3 mg total) by mouth at bedtime.   Semaglutide-Weight Management (WEGOVY) 0.5 MG/0.5ML SOAJ Inject 0.5 mg into the skin once a week.   tamoxifen  (NOLVADEX ) 20 MG tablet TAKE 1 TABLET BY MOUTH EVERY DAY   traZODone  (DESYREL ) 100 MG tablet Take 0.5-1 tablets (50-100 mg total) by mouth at bedtime.   [DISCONTINUED] levothyroxine (SYNTHROID) 25 MCG tablet Take 25 mcg by mouth daily before breakfast.   levothyroxine (SYNTHROID) 50 MCG tablet Take 1 tablet (50 mcg total) by mouth daily before breakfast.   meloxicam (MOBIC) 15 MG tablet Take 15 mg by mouth daily. (Patient not taking: Reported on 04/26/2024)   sertraline  (ZOLOFT ) 100 MG tablet Take 2 tablets (200 mg total) by mouth daily.   No facility-administered encounter medications on file as of 04/26/2024.    ALLERGIES: Allergies  Allergen Reactions   Haemophilus Influenzae Vaccines Hives   Penicillins Hives   VACCINATION STATUS: Immunization History  Administered Date(s) Administered   Moderna Sars-Covid-2 Vaccination 06/24/2020, 07/22/2020, 12/30/2020   Tdap 05/10/2014     HPI   Maria Diaz  is a patient with the above medical history. she was diagnosed with hypothyroidism at approximate age of 1 years (around December of 2024), which required subsequent initiation of thyroid hormone replacement therapy. she was given Levothyroxine 25 micrograms. she reports compliance to this medication:  but she does note she takes it with her other morning medications.  I reviewed patient's thyroid tests:  Lab Results  Component  Value Date   TSH 0.520 04/25/2024   TSH 0.35 (A) 11/03/2023   TSH 0.53 08/03/2023   TSH 0.582 07/20/2020   FREET4 0.92 04/25/2024     Pt denies feeling nodules in neck, hoarseness, dysphagia/odynophagia, SOB with lying  down.  she denies any known family history of thyroid disorders.  No known family history of thyroid cancer.  No history of radiation therapy to head or neck- she did have radiation treatment for breast cancer in the past but was limited to axilla lymph nodes.  No recent use of iodine supplements.  Denies use of Biotin containing supplements.  I reviewed her chart and she also has a history of preDM, obesity (currently on Wegovy).   Review of systems  Constitutional: + decreasing body weight-on Wegovy,  current Body mass index is 29.65 kg/m. , + fatigue, no subjective hyperthermia, no subjective hypothermia Eyes: no blurry vision, no xerophthalmia ENT: no sore throat, no nodules palpated in throat, no dysphagia/odynophagia, no hoarseness Cardiovascular: no chest pain, no shortness of breath, no palpitations, no leg swelling Respiratory: no cough, no shortness of breath Gastrointestinal: no nausea/vomiting/diarrhea Musculoskeletal: no muscle/joint aches Skin: no rashes, no hyperemia Neurological: no tremors, no numbness, no tingling, no dizziness Psychiatric: no depression, no anxiety   Objective:   Objective     BP 98/60 (BP Location: Right Arm, Patient Position: Sitting, Cuff Size: Large)   Pulse 82   Ht 5\' 8"  (1.727 m)   Wt 195 lb (88.5 kg)   SpO2 96%   BMI 29.65 kg/m  Wt Readings from Last 3 Encounters:  04/26/24 195 lb (88.5 kg)  03/08/24 206 lb 6.4 oz (93.6 kg)  02/22/24 208 lb 12.8 oz (94.7 kg)    BP Readings from Last 3 Encounters:  04/26/24 98/60  03/08/24 90/60  02/22/24 113/64      Physical Exam- Limited  Constitutional:  Body mass index is 29.65 kg/m. , not in acute distress, normal state of mind Eyes:  EOMI, no  exophthalmos Musculoskeletal: no gross deformities, strength intact in all four extremities, no gross restriction of joint movements Skin:  no rashes, no hyperemia Neurological: no tremor with outstretched hands   CMP ( most recent) CMP     Component Value Date/Time   NA 140 03/12/2022 0826   K 3.8 03/12/2022 0826   CL 111 03/12/2022 0826   CO2 24 03/12/2022 0826   GLUCOSE 107 (H) 03/12/2022 0826   BUN 15 03/12/2022 0826   CREATININE 0.71 03/12/2022 0826   CALCIUM 9.8 03/12/2022 0826   PROT 7.7 03/12/2022 0826   ALBUMIN 4.2 03/12/2022 0826   AST 11 (L) 03/12/2022 0826   ALT 9 03/12/2022 0826   ALKPHOS 71 03/12/2022 0826   BILITOT 0.3 03/12/2022 0826   GFRNONAA >60 03/12/2022 0826     Diabetic Labs (most recent): Lab Results  Component Value Date   HGBA1C 5.6 07/20/2020     Lipid Panel ( most recent) Lipid Panel     Component Value Date/Time   CHOL 193 07/20/2020 2320   TRIG 60 07/20/2020 2320   HDL 52 07/20/2020 2320   CHOLHDL 3.7 07/20/2020 2320   VLDL 12 07/20/2020 2320   LDLCALC 129 (H) 07/20/2020 2320       Lab Results  Component Value Date   TSH 0.520 04/25/2024   TSH 0.35 (A) 11/03/2023   TSH 0.53 08/03/2023   TSH 0.582 07/20/2020   FREET4 0.92 04/25/2024     Latest Reference Range & Units 07/20/20 23:20 08/03/23 00:00 11/03/23 00:00 04/25/24 11:23  TSH 0.450 - 4.500 uIU/mL 0.582 0.53 (E) 0.35 ! (E) 0.520  T4,Free(Direct) 0.82 - 1.77 ng/dL    3.47  !: Data is abnormal (E): External lab result  Assessment & Plan:  ASSESSMENT / PLAN:  1. Hypothyroidism- r/t Hashimoto's thyroiditis  Patient with long-standing hypothyroidism, on levothyroxine therapy.  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    Her previsit TFTs are consistent with slight-under replacement (free T4 on lower end of normal).  She is advised to increase Levothyroxine to 50 mcg po daily before breakfast.    - We discussed about correct  intake of levothyroxine, at fasting, with water, separated by at least 30 minutes from breakfast, and separated by more than 4 hours from calcium, iron, multivitamins, acid reflux medications (PPIs). -Patient is made aware of the fact that thyroid hormone replacement is needed for life, dose to be adjusted by periodic monitoring of thyroid function tests.   -Due to absence of clinical goiter, no need for thyroid ultrasound.   I spent  22  minutes in the care of the patient today including review of labs from Thyroid Function, CMP, and other relevant labs ; imaging/biopsy records (current and previous including abstractions from other facilities); face-to-face time discussing  her lab results and symptoms, medications doses, her options of short and long term treatment based on the latest standards of care / guidelines;   and documenting the encounter.  Maria Diaz  participated in the discussions, expressed understanding, and voiced agreement with the above plans.  All questions were answered to her satisfaction. she is encouraged to contact clinic should she have any questions or concerns prior to her return visit.   FOLLOW UP PLAN:  Return in about 4 months (around 08/26/2024) for Thyroid follow up, Previsit labs.  Hulon Magic, [facility] [address] [place], [place] [address] Phone: [phone] Fax: [phone]  04/26/2024, 11:10 AM

## 2024-04-28 ENCOUNTER — Ambulatory Visit (HOSPITAL_COMMUNITY): Admitting: Psychiatry

## 2024-04-28 DIAGNOSIS — F330 Major depressive disorder, recurrent, mild: Secondary | ICD-10-CM

## 2024-04-28 DIAGNOSIS — F4310 Post-traumatic stress disorder, unspecified: Secondary | ICD-10-CM
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
## 2024-05-12 ENCOUNTER — Ambulatory Visit (HOSPITAL_COMMUNITY): Admitting: Psychiatry

## 2024-05-12 DIAGNOSIS — F4310 Post-traumatic stress disorder, unspecified: Secondary | ICD-10-CM

## 2024-05-12 DIAGNOSIS — F330 Major depressive disorder, recurrent, mild: Secondary | ICD-10-CM

## 2024-05-12 DIAGNOSIS — F329 Major depressive disorder, single episode, unspecified: Secondary | ICD-10-CM
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
## 2024-05-13 ENCOUNTER — Other Ambulatory Visit: Payer: Self-pay | Admitting: Psychiatry

## 2024-05-16 ENCOUNTER — Telehealth (INDEPENDENT_AMBULATORY_CARE_PROVIDER_SITE_OTHER): Admitting: Psychiatry

## 2024-05-16 ENCOUNTER — Encounter: Payer: Self-pay | Admitting: Psychiatry

## 2024-05-16 DIAGNOSIS — G4700 Insomnia, unspecified: Secondary | ICD-10-CM

## 2024-05-16 DIAGNOSIS — E039 Hypothyroidism, unspecified: Secondary | ICD-10-CM | POA: Diagnosis not present

## 2024-05-16 DIAGNOSIS — F4310 Post-traumatic stress disorder, unspecified: Secondary | ICD-10-CM

## 2024-05-16 DIAGNOSIS — F330 Major depressive disorder, recurrent, mild: Secondary | ICD-10-CM

## 2024-05-16 MED ORDER — PRAZOSIN HCL 1 MG PO CAPS: 3.0000 mg | ORAL_CAPSULE | Freq: Every day | ORAL | 0 refills | Status: DC

## 2024-05-16 MED ORDER — TRAZODONE HCL 100 MG PO TABS: 50.0000 mg | ORAL_TABLET | Freq: Every day | ORAL | 0 refills | Status: DC

## 2024-05-16 MED ORDER — SERTRALINE HCL 100 MG PO TABS: 200.0000 mg | ORAL_TABLET | Freq: Every day | ORAL | 1 refills | Status: DC

## 2024-05-16 NOTE — Patient Instructions (Signed)
 Continue sertraline  200 mg daily Continue mirtazapine  30 mg at night Continue hydroxyzine  25 mg daily as needed for anxiety  Continue prazosin  3 mg at night  Continue Trazodone  100 mg at night as needed for insomnia Next appointment- 8/6 at 11 30

## 2024-05-29 ENCOUNTER — Ambulatory Visit (INDEPENDENT_AMBULATORY_CARE_PROVIDER_SITE_OTHER): Admitting: Psychiatry

## 2024-05-29 DIAGNOSIS — F4310 Post-traumatic stress disorder, unspecified: Secondary | ICD-10-CM

## 2024-05-29 DIAGNOSIS — F329 Major depressive disorder, single episode, unspecified: Secondary | ICD-10-CM | POA: Diagnosis not present

## 2024-05-29 DIAGNOSIS — F330 Major depressive disorder, recurrent, mild: Secondary | ICD-10-CM
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
## 2024-06-12 ENCOUNTER — Ambulatory Visit (INDEPENDENT_AMBULATORY_CARE_PROVIDER_SITE_OTHER): Admitting: Psychiatry

## 2024-06-12 DIAGNOSIS — F330 Major depressive disorder, recurrent, mild: Secondary | ICD-10-CM

## 2024-06-12 DIAGNOSIS — F4310 Post-traumatic stress disorder, unspecified: Secondary | ICD-10-CM
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
## 2024-06-16 ENCOUNTER — Telehealth: Payer: Self-pay

## 2024-06-19 ENCOUNTER — Inpatient Hospital Stay: Payer: 59 | Attending: Hematology and Oncology | Admitting: Hematology and Oncology

## 2024-06-19 ENCOUNTER — Encounter: Payer: Self-pay | Admitting: Hematology and Oncology

## 2024-06-19 VITALS — BP 108/67 | HR 89 | Temp 98.3°F | Resp 17 | Wt 185.6 lb

## 2024-06-19 DIAGNOSIS — Z923 Personal history of irradiation: Secondary | ICD-10-CM | POA: Diagnosis not present

## 2024-06-19 DIAGNOSIS — C50212 Malignant neoplasm of upper-inner quadrant of left female breast: Secondary | ICD-10-CM | POA: Diagnosis not present

## 2024-06-19 DIAGNOSIS — Z170 Estrogen receptor positive status [ER+]: Secondary | ICD-10-CM | POA: Diagnosis not present

## 2024-06-19 DIAGNOSIS — Z9221 Personal history of antineoplastic chemotherapy: Secondary | ICD-10-CM | POA: Diagnosis not present

## 2024-06-19 DIAGNOSIS — Z79810 Long term (current) use of selective estrogen receptor modulators (SERMs): Secondary | ICD-10-CM | POA: Insufficient documentation

## 2024-06-19 NOTE — Progress Notes (Signed)
  Cancer Center Cancer Follow up:    Maria Guy, DO [address] [place] [address]   DIAGNOSIS:  Cancer Staging  Malignant neoplasm of upper-inner quadrant of left breast in female, estrogen receptor positive (HCC) Staging form: Breast, AJCC 8th Edition - Clinical: Stage IIB (cT2, cN0, cM0, G3, ER+, PR-, HER2-) - Signed by Loretha Ash, MD on 12/21/2023 Stage prefix: Initial diagnosis Histologic grading system: 3 grade system   SUMMARY OF ONCOLOGIC HISTORY: Oncology History  Malignant neoplasm of upper-inner quadrant of left breast in female, estrogen receptor positive (HCC)  12/26/2021 Genetic Testing   Negative.  The Common Hereditary Gene Panel offered by [facility] includes sequencing and/or deletion duplication testing of the following 47 genes: APC, ATM, AXIN2, BARD1, BMPR1A, BRCA1, BRCA2, BRIP1, CDH1, CDK4, CDKN2A (p14ARF), CDKN2A (p16INK4a), CHEK2, CTNNA1, DICER1, EPCAM (Deletion/duplication testing only), GREM1 (promoter region deletion/duplication testing only), KIT, MEN1, MLH1, MSH2, MSH3, MSH6, MUTYH, NBN, NF1, NHTL1, PALB2, PDGFRA, PMS2, POLD1, POLE, PTEN, RAD50, RAD51C, RAD51D, SDHB, SDHC, SDHD, SMAD4, SMARCA4. STK11, TP53, TSC1, TSC2, and VHL.  The following genes were evaluated for sequence changes only: SDHA and HOXB13 c.251G>A variant only.    12/31/2021 Initial Diagnosis   Malignant neoplasm of upper-inner quadrant of left breast in female, estrogen receptor positive (HCC)   12/31/2021 Cancer Staging   Staging form: Breast, AJCC 8th Edition - Clinical: Stage IIB (cT2, cN0, cM0, G3, ER+, PR-, HER2-) - Signed by Yasuo Phimmasone, MD on 12/21/2023 Stage prefix: Initial diagnosis Histologic grading system: 3 grade system   01/08/2022 -  Chemotherapy   Completed 4 cycles of neoadjuvant TC, last cycle received on March 12, 2022     04/21/2022 Definitive Surgery   She had left breast lumpectomy with no residual carcinoma status post neoadjuvant therapy, treatment  related changes, no carcinoma identified in lymph nodes.  Final pathologic staging PT0N0 she also underwent bilateral mastopexy, benign breast with no evidence of carcinoma   05/28/2022 - 07/06/2022 Radiation Therapy   Site Technique Total Dose (Gy) Dose per Fx (Gy) Completed Fx Beam Energies  Breast, Left: Breast_L 3D 50.4/50.4 1.8 28/28 10XFFF     06/2022 -  Anti-estrogen oral therapy   Tamoxifen      CURRENT THERAPY: Tamoxifen   INTERVAL HISTORY:  Discussed the use of AI scribe software for clinical note transcription with the patient, who gave verbal consent to proceed  History of Present Illness Maria Diaz is a 53 year old female with a history of breast cancer stage 2B left breast ER positive breast cancer status post neoadjuvant TC followed by left breast lumpectomy and radiation in August of 2023 now on adj tamoxifen  who presents for follow-up regarding a palpable mass and ongoing tamoxifen  therapy.  She has a palpable mass in the breast, which she has been able to feel for a while. Recent mammogram and ultrasound did not show any concerning findings. She has a history of bilateral mastopexy and surgery to make the breasts symmetric.  She is currently on tamoxifen  therapy following breast cancer treatment and experiences hot flashes as a side effect. No other side effects are reported. No vaginal bleeding, leg swelling, or shortness of breath.  She mentions sleep disturbances, which she attributes to perimenopause, and notes that she did not sleep well the previous night. She continues to engage in physical activity, walking approximately 30 minutes a day.  About a month ago, she visited urgent care for a stomach virus, which has since resolved. She denies any recent  hospital visits or other doctor's appointments for health issues.    Patient Active Problem List   Diagnosis Date Noted   Anemia 12/31/2021   Malignant neoplasm of upper-inner quadrant of left breast in  female, estrogen receptor positive (HCC) 12/31/2021   Open angle with borderline findings and low glaucoma risk in both eyes 01/11/2016   Chronic idiopathic urticaria 01/11/2016   Age-related nuclear cataract of both eyes 01/11/2016   Eczema 01/11/2016    is allergic to haemophilus influenzae vaccines and penicillins.  MEDICAL HISTORY: Past Medical History:  Diagnosis Date   Anemia    Anxiety    Depression    Glaucoma    History of radiation therapy    Left breast 05/28/22-07/06/22-Dr. Lynwood Nasuti   Hives    chronic, on Xolair injections    SURGICAL HISTORY: Past Surgical History:  Procedure Laterality Date   BREAST BIOPSY Left 01/19/2022   BREAST CYST EXCISION Left 01/28/2021   Procedure: LEFT BREAST MASS EXCISION;  Surgeon: Ebbie Cough, MD;  Location: [facility];  Service: General;  Laterality: Left;  START TIME OF 3:00 PM FOR 60 MINUTES IN ROOM 8   BREAST LUMPECTOMY WITH RADIOACTIVE SEED AND SENTINEL LYMPH NODE BIOPSY Left 04/13/2022   Procedure: LEFT BREAST BRACKETED LUMPECTOMY WITH RADIOACTIVE SEED AND AXILLARY SENTINEL LYMPH NODE BIOPSY;  Surgeon: Ebbie Cough, MD;  Location: [facility];  Service: General;  Laterality: Left;   HYSTEROSCOPY W/ ENDOMETRIAL ABLATION  08/14/2020   [facility]   MASTOPEXY Bilateral 04/21/2022   Procedure: MASTOPEXY;  Surgeon: Arelia Filippo, MD;  Location: [facility];  Service: Plastics;  Laterality: Bilateral;    SOCIAL HISTORY: Social History   Socioeconomic History   Marital status: Divorced    Spouse name: Not on file   Number of children: Not on file   Years of education: Not on file   Highest education level: Not on file  Occupational History   Not on file  Tobacco Use   Smoking status: Never   Smokeless tobacco: Never  Substance and Sexual Activity   Alcohol use: Yes    Alcohol/week: 1.0 standard drink of alcohol    Types: 1 Glasses of wine per week    Comment: holidays    Drug use: Never   Sexual activity: Yes    Birth control/protection: None    Comment: ablation  Other Topics Concern   Not on file  Social History Narrative   Not on file   Social Drivers of Health   Financial Resource Strain: Low Risk  (08/16/2023)   Received from [facility]   Overall Financial Resource Strain (CARDIA)    Difficulty of Paying Living Expenses: Not hard at all  Food Insecurity: No Food Insecurity (11/02/2023)   [facility]   Hunger Vital Sign    Within the past 12 months, you worried that your food would run out before you got the money to buy more.: Never true    Within the past 12 months, the food you bought just didn't last and you didn't have money to get more.: Never true  Transportation Needs: No Transportation Needs (11/02/2023)   Received from [facility] - Transportation    Lack of Transportation (Medical): No    Lack of Transportation (Non-Medical): No  Physical Activity: Insufficiently Active (11/02/2023)   Received from [facility]   Exercise Vital Sign    On average, how many days per week do  you engage in moderate to strenuous exercise (like a brisk walk)?: 2 days    On average, how many minutes do you engage in exercise at this level?: 30 min  Stress: No Stress Concern Present (11/02/2023)   Received from [facility] - Occupational Stress Questionnaire    Feeling of Stress : Only a little  Social Connections: Socially Integrated (07/30/2022)   Received from [facility]   Social Connection and Isolation Panel    In a typical week, how many times do you talk on the phone with family, friends, or neighbors?: Twice a week    How often do you get together with friends or relatives?: Twice a week    How often do you attend church or religious services?: 1 to 4 times per year    Do you belong to any clubs or organizations such as church groups, unions, fraternal  or athletic groups, or school groups?: No    How often do you attend meetings of the clubs or organizations you belong to?: 1 to 4 times per year    Are you married, widowed, divorced, separated, never married, or living with a partner?: Married  Intimate Partner Violence: Not At Risk (07/30/2022)   Received from [facility]   Humiliation, Afraid, Rape, and Kick questionnaire    Within the last year, have you been afraid of your partner or ex-partner?: No    Within the last year, have you been humiliated or emotionally abused in other ways by your partner or ex-partner?: No    Within the last year, have you been kicked, hit, slapped, or otherwise physically hurt by your partner or ex-partner?: No    Within the last year, have you been raped or forced to have any kind of sexual activity by your partner or ex-partner?: No    FAMILY HISTORY: Family History  Problem Relation Age of Onset   Anxiety disorder Sister    Drug abuse Mother     Review of Systems  Constitutional:  Negative for appetite change, chills, fatigue, fever and unexpected weight change.  HENT:   Negative for hearing loss, lump/mass and trouble swallowing.   Eyes:  Negative for eye problems and icterus.  Respiratory:  Negative for chest tightness, cough and shortness of breath.   Cardiovascular:  Negative for chest pain, leg swelling and palpitations.  Gastrointestinal:  Negative for abdominal distention, abdominal pain, constipation, diarrhea, nausea and vomiting.  Endocrine: Negative for hot flashes.  Genitourinary:  Negative for difficulty urinating.   Musculoskeletal:  Negative for arthralgias.  Skin:  Negative for itching and rash.  Neurological:  Negative for dizziness, extremity weakness, headaches and numbness.  Hematological:  Negative for adenopathy. Does not bruise/bleed easily.  Psychiatric/Behavioral:  Negative for depression. The patient is not nervous/anxious.       PHYSICAL EXAMINATION   Onc  Performance Status - 06/19/24 0900       KPS SCALE   KPS % SCORE Able to carry on normal activity, minor s/s of disease           Vitals:   06/19/24 0913  BP: 108/67  Pulse: 89  Resp: 17  Temp: 98.3 F (36.8 C)  SpO2: 97%     Physical Exam Constitutional:      General: She is not in acute distress.    Appearance: Normal appearance. She is not toxic-appearing.  HENT:     Head: Normocephalic and atraumatic.  Eyes:  General: No scleral icterus. Chest:       Comments: Left breast: abnormal density next to the surgical scar likely dense tissue, no def palpable mass. NO regional adenopathy Musculoskeletal:        General: No swelling.  Lymphadenopathy:     Upper Body:     Right upper body: No supraclavicular or axillary adenopathy.     Left upper body: No supraclavicular or axillary adenopathy.  Skin:    General: Skin is warm and dry.     Findings: No rash.  Neurological:     General: No focal deficit present.     Mental Status: She is alert.  Psychiatric:        Mood and Affect: Mood normal.        Behavior: Behavior normal.        ASSESSMENT and THERAPY PLAN:   Malignant neoplasm of upper-inner quadrant of left breast in female, estrogen receptor positive (HCC)  ER positive breast cancer Stage 2B ER positive breast cancer in the left breast, treated with neoadjuvant TC, lumpectomy, and radiation. Currently on tamoxifen  therap  Assessment and Plan Assessment & Plan Breast cancer Continues tamoxifen  with hot flashes as the only side effect. Discussed serious side effects like thromboembolism and endometrial cancer.  - Continue tamoxifen  therapy. - Monitor for thromboembolism and endometrial cancer symptoms: leg swelling, sudden pain, dyspnea, vaginal bleeding. - Schedule mammogram for April next year. - Perform liver function test in six months. - Provide pamphlet for [employer] reveal  Breast mass Palpable mass likely scar tissue vs dense breast  tissue. Recent imaging showed no concerning findings. - Monitor for changes in the breast mass and report if it enlarges or changes.  Perimenopausal symptoms Experiencing sleep disturbances and hot flashes, likely related to perimenopause.  General Health Maintenance Engages in regular physical activity, walking approximately 30 minutes daily. - Schedule follow-up appointment in six months.      All questions were answered. The patient knows to call the clinic with any problems, questions or concerns. We can certainly see the patient much sooner if necessary.  Total encounter time:30 minutes*in face-to-face visit time, chart review, lab review, care coordination, order entry, and documentation of the encounter time.    *Total Encounter Time as defined by the Centers for Medicare and Medicaid Services includes, in addition to the face-to-face time of a patient visit (documented in the note above) non-face-to-face time: obtaining and reviewing outside history, ordering and reviewing medications, tests or procedures, care coordination (communications with other health care professionals or caregivers) and documentation in the medical record.

## 2024-06-19 NOTE — Assessment & Plan Note (Addendum)
  ER positive breast cancer Stage 2B ER positive breast cancer in the left breast, treated with neoadjuvant TC, lumpectomy, and radiation. Currently on tamoxifen  therap  Assessment and Plan Assessment & Plan Breast cancer Continues tamoxifen  with hot flashes as the only side effect. Discussed serious side effects like thromboembolism and endometrial cancer.  - Continue tamoxifen  therapy. - Monitor for thromboembolism and endometrial cancer symptoms: leg swelling, sudden pain, dyspnea, vaginal bleeding. - Schedule mammogram for April next year. - Perform liver function test in six months. - Provide pamphlet for Guardant reveal  Breast mass Palpable mass likely scar tissue vs dense breast tissue. Recent imaging showed no concerning findings. - Monitor for changes in the breast mass and report if it enlarges or changes.  Perimenopausal symptoms Experiencing sleep disturbances and hot flashes, likely related to perimenopause.  General Health Maintenance Engages in regular physical activity, walking approximately 30 minutes daily. - Schedule follow-up appointment in six months.

## 2024-06-23 ENCOUNTER — Other Ambulatory Visit: Payer: Self-pay
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
## 2024-06-28 ENCOUNTER — Encounter: Payer: Self-pay | Admitting: Psychiatry

## 2024-06-28 ENCOUNTER — Telehealth: Admitting: Psychiatry

## 2024-06-28 DIAGNOSIS — F4310 Post-traumatic stress disorder, unspecified: Secondary | ICD-10-CM | POA: Diagnosis not present

## 2024-06-28 DIAGNOSIS — G4700 Insomnia, unspecified: Secondary | ICD-10-CM | POA: Diagnosis not present

## 2024-06-28 DIAGNOSIS — F330 Major depressive disorder, recurrent, mild: Secondary | ICD-10-CM | POA: Diagnosis not present

## 2024-06-28 MED ORDER — MIRTAZAPINE 30 MG PO TABS: 30.0000 mg | ORAL_TABLET | Freq: Every day | ORAL | 0 refills | Status: DC

## 2024-06-28 MED ORDER — VENLAFAXINE HCL ER 150 MG PO CP24: 150.0000 mg | ORAL_CAPSULE | Freq: Every day | ORAL | 1 refills | Status: DC

## 2024-06-28 MED ORDER — VENLAFAXINE HCL ER 37.5 MG PO CP24: ORAL_CAPSULE | ORAL | 0 refills | Status: DC

## 2024-06-28 NOTE — Addendum Note (Signed)
 Addended by: Zyron Deeley on: 06/28/2024 11:58 AM   Modules accepted: Orders

## 2024-06-28 NOTE — Patient Instructions (Addendum)
 Please make this adjustment  Sertraline : Week 1: Reduce to 150 mg daily Week 2: Reduce to 100 mg daily Week 3: Reduce to 50 mg daily Week 4: Discontinue  Venlafaxine : Week 1: Start 37.5 mg daily Week 2: Increase to 75 mg daily Week 3: Increase to 150 mg daily Week 4- continue 150 mg daily  Continue mirtazapine  30 mg at night Continue hydroxyzine  25 mg daily as needed for anxiety  Continue prazosin  3 mg at night  Continue Trazodone  100 mg at night as needed for insomnia Next appointment- 9/24 at 11 30, video

## 2024-07-07 ENCOUNTER — Ambulatory Visit (INDEPENDENT_AMBULATORY_CARE_PROVIDER_SITE_OTHER): Admitting: Psychiatry

## 2024-07-07 DIAGNOSIS — F4310 Post-traumatic stress disorder, unspecified: Secondary | ICD-10-CM

## 2024-07-07 DIAGNOSIS — F330 Major depressive disorder, recurrent, mild: Secondary | ICD-10-CM | POA: Diagnosis not present
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
## 2024-07-21 ENCOUNTER — Ambulatory Visit (HOSPITAL_COMMUNITY): Admitting: Psychiatry

## 2024-07-21 DIAGNOSIS — F4310 Post-traumatic stress disorder, unspecified: Secondary | ICD-10-CM

## 2024-07-21 DIAGNOSIS — F329 Major depressive disorder, single episode, unspecified: Secondary | ICD-10-CM

## 2024-07-21 DIAGNOSIS — F330 Major depressive disorder, recurrent, mild: Secondary | ICD-10-CM
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
## 2024-07-26 ENCOUNTER — Telehealth: Payer: Self-pay

## 2024-07-26 NOTE — Telephone Encounter (Signed)
 Phone call made to [facility]  with negative results from [place] Reveal test.

## 2024-07-28 ENCOUNTER — Encounter: Payer: Self-pay | Admitting: Hematology and Oncology
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
## 2024-08-16 ENCOUNTER — Telehealth (INDEPENDENT_AMBULATORY_CARE_PROVIDER_SITE_OTHER): Admitting: Psychiatry

## 2024-08-16 ENCOUNTER — Encounter: Payer: Self-pay | Admitting: Psychiatry

## 2024-08-16 DIAGNOSIS — F4310 Post-traumatic stress disorder, unspecified: Secondary | ICD-10-CM | POA: Diagnosis not present

## 2024-08-16 DIAGNOSIS — G4700 Insomnia, unspecified: Secondary | ICD-10-CM | POA: Diagnosis not present

## 2024-08-16 DIAGNOSIS — F330 Major depressive disorder, recurrent, mild: Secondary | ICD-10-CM | POA: Diagnosis not present

## 2024-08-16 DIAGNOSIS — E039 Hypothyroidism, unspecified: Secondary | ICD-10-CM

## 2024-08-16 NOTE — Patient Instructions (Signed)
 Continue sertraline  200 mg daily  Continue mirtazapine  30 mg at night Continue hydroxyzine  25 mg daily as needed for anxiety  Continue prazosin  3 mg at night  Continue Trazodone  100 mg at night as needed for insomnia Next appointment- 11/26 at 9 30,

## 2024-08-17 ENCOUNTER — Other Ambulatory Visit: Payer: Self-pay | Admitting: Psychiatry

## 2024-08-18 ENCOUNTER — Other Ambulatory Visit: Payer: Self-pay | Admitting: Psychiatry

## 2024-08-18 LAB — T4, FREE: Free T4: 1 ng/dL (ref 0.82–1.77)

## 2024-08-18 LAB — TSH: TSH: 0.189 u[IU]/mL — ABNORMAL LOW (ref 0.450–4.500)

## 2024-08-25 ENCOUNTER — Other Ambulatory Visit: Payer: Self-pay | Admitting: Nurse Practitioner

## 2024-08-28 NOTE — Patient Instructions (Signed)

## 2024-08-29 ENCOUNTER — Ambulatory Visit: Admitting: Nurse Practitioner

## 2024-08-29 ENCOUNTER — Encounter: Payer: Self-pay | Admitting: Nurse Practitioner

## 2024-08-29 VITALS — BP 98/64 | HR 90 | Ht 68.0 in | Wt 179.4 lb

## 2024-08-29 DIAGNOSIS — E063 Autoimmune thyroiditis: Secondary | ICD-10-CM

## 2024-08-29 NOTE — Progress Notes (Signed)
 Endocrinology Follow Up Note                                         08/29/2024, 9:49 AM  Subjective:   Subjective    Maria Diaz is a 53 y.o.-year-old female patient being seen in follow up after being seen in consultation for hypothyroidism referred by Maria Guy, DO.   Past Medical History:  Diagnosis Date   Anemia    Anxiety    Depression    Glaucoma    History of radiation therapy    Left breast 05/28/22-07/06/22-Dr. Lynwood Nasuti   Hives    chronic, on Xolair injections    Past Surgical History:  Procedure Laterality Date   BREAST BIOPSY Left 01/19/2022   BREAST CYST EXCISION Left 01/28/2021   Procedure: LEFT BREAST MASS EXCISION;  Surgeon: Ebbie Cough, MD;  Location: [facility];  Service: General;  Laterality: Left;  START TIME OF 3:00 PM FOR 60 MINUTES IN ROOM 8   BREAST LUMPECTOMY WITH RADIOACTIVE SEED AND SENTINEL LYMPH NODE BIOPSY Left 04/13/2022   Procedure: LEFT BREAST BRACKETED LUMPECTOMY WITH RADIOACTIVE SEED AND AXILLARY SENTINEL LYMPH NODE BIOPSY;  Surgeon: Ebbie Cough, MD;  Location: [facility];  Service: General;  Laterality: Left;   HYSTEROSCOPY W/ ENDOMETRIAL ABLATION  08/14/2020   [facility]   MASTOPEXY Bilateral 04/21/2022   Procedure: MASTOPEXY;  Surgeon: Arelia Filippo, MD;  Location: [facility];  Service: Plastics;  Laterality: Bilateral;    Social History   Socioeconomic History   Marital status: Divorced    Spouse name: Not on file   Number of children: Not on file   Years of education: Not on file   Highest education level: Not on file  Occupational History   Not on file  Tobacco Use   Smoking status: Never   Smokeless tobacco: Never  Substance and Sexual Activity   Alcohol use: Yes    Alcohol/week: 1.0 standard drink of alcohol    Types: 1 Glasses of wine per week    Comment: holidays   Drug use: Never    Sexual activity: Yes    Birth control/protection: None    Comment: ablation  Other Topics Concern   Not on file  Social History Narrative   Not on file   Social Drivers of Health   Financial Resource Strain: Low Risk  (08/16/2023)   Received from [facility]   Overall Financial Resource Strain (CARDIA)    Difficulty of Paying Living Expenses: Not hard at all  Food Insecurity: No Food Insecurity (11/02/2023)   Received from [facility]   Hunger Vital Sign    Within the past 12 months, you worried that your food would run out before you got the money to buy more.: Never true    Within the past 12 months, the food you bought just didn't last and you didn't have money to get more.: Never true  Transportation Needs: No Transportation Needs (11/02/2023)  Received from [facility] - Transportation    Lack of Transportation (Medical): No    Lack of Transportation (Non-Medical): No  Physical Activity: Insufficiently Active (11/02/2023)   Received from [facility]   Exercise Vital Sign    On average, how many days per week do you engage in moderate to strenuous exercise (like a brisk walk)?: 2 days    On average, how many minutes do you engage in exercise at this level?: 30 min  Stress: No Stress Concern Present (11/02/2023)   Received from [facility] - Occupational Stress Questionnaire    Feeling of Stress : Only a little  Social Connections: Socially Integrated (07/30/2022)   Received from [facility]   Social Connection and Isolation Panel    In a typical week, how many times do you talk on the phone with family, friends, or neighbors?: Twice a week    How often do you get together with friends or relatives?: Twice a week    How often do you attend church or religious services?: 1 to 4 times per year    Do you belong to any clubs or organizations such as church groups, unions, fraternal or athletic groups, or  school groups?: No    How often do you attend meetings of the clubs or organizations you belong to?: 1 to 4 times per year    Are you married, widowed, divorced, separated, never married, or living with a partner?: Married    Family History  Problem Relation Age of Onset   Anxiety disorder Sister    Drug abuse Mother     Outpatient Encounter Medications as of 08/29/2024  Medication Sig   atorvastatin (LIPITOR) 10 MG tablet Take 10 mg by mouth daily.   EPINEPHrine  0.3 mg/0.3 mL IJ SOAJ injection Inject 0.3 mg into the muscle as needed. For severe allergy   fluticasone (FLONASE) 50 MCG/ACT nasal spray Place 2 sprays into the nose daily as needed.   hydrOXYzine  (ATARAX ) 25 MG tablet Take 1 tablet (25 mg total) by mouth daily as needed for anxiety.   levothyroxine  (SYNTHROID ) 50 MCG tablet TAKE 1 TABLET BY MOUTH DAILY BEFORE BREAKFAST   mirtazapine  (REMERON ) 30 MG tablet Take 1 tablet (30 mg total) by mouth at bedtime.   prazosin  (MINIPRESS ) 1 MG capsule Take 3 capsules (3 mg total) by mouth at bedtime.   Semaglutide-Weight Management (WEGOVY) 0.5 MG/0.5ML SOAJ Inject 0.5 mg into the skin once a week.   sertraline  (ZOLOFT ) 100 MG tablet Take 2 tablets (200 mg total) by mouth daily.   tamoxifen  (NOLVADEX ) 20 MG tablet TAKE 1 TABLET BY MOUTH EVERY DAY   traZODone  (DESYREL ) 100 MG tablet Take 0.5-1 tablets (50-100 mg total) by mouth at bedtime.   No facility-administered encounter medications on file as of 08/29/2024.    ALLERGIES: Allergies  Allergen Reactions   Haemophilus Influenzae Vaccines Hives   Penicillins Hives   VACCINATION STATUS: Immunization History  Administered Date(s) Administered   Moderna Sars-Covid-2 Vaccination 06/24/2020, 07/22/2020, 12/30/2020   Tdap 05/10/2014     HPI   Maria Diaz  is a patient with the above medical history. she was diagnosed with hypothyroidism at approximate age of 71 years (around December of 2024), which required subsequent  initiation of thyroid hormone replacement therapy. she was given Levothyroxine  25 micrograms. she reports compliance to this medication:  but she does note she takes it with her other  morning medications.  I reviewed patient's thyroid tests:  Lab Results  Component Value Date   TSH 0.189 (L) 08/17/2024   TSH 0.520 04/25/2024   TSH 0.35 (A) 11/03/2023   TSH 0.53 08/03/2023   TSH 0.582 07/20/2020   FREET4 1.00 08/17/2024   FREET4 0.92 04/25/2024     Pt denies feeling nodules in neck, hoarseness, dysphagia/odynophagia, SOB with lying down.  she denies any known family history of thyroid disorders.  No known family history of thyroid cancer.  No history of radiation therapy to head or neck- she did have radiation treatment for breast cancer in the past but was limited to axilla lymph nodes.  No recent use of iodine supplements.  Denies use of Biotin containing supplements.  I reviewed her chart and she also has a history of preDM, obesity (currently on Wegovy).   Review of systems  Constitutional: +decreasing body weight (on [place]),  current Body mass index is 27.28 kg/m. , + fatigue-improved somewhat, no subjective hyperthermia, no subjective hypothermia Eyes: no blurry vision, no xerophthalmia ENT: no sore throat, no nodules palpated in throat, no dysphagia/odynophagia, no hoarseness Cardiovascular: no chest pain, no shortness of breath, no palpitations, no leg swelling Respiratory: no cough, no shortness of breath Gastrointestinal: no nausea/vomiting/diarrhea Musculoskeletal: no muscle/joint aches Skin: no rashes, no hyperemia Neurological: no tremors, no numbness, no tingling, no dizziness Psychiatric: no depression, no anxiety   Objective:   Objective     BP 98/64 (BP Location: Right Arm, Patient Position: Sitting, Cuff Size: Large)   Pulse 90   Ht 5' 8 (1.727 m)   Wt 179 lb 6.4 oz (81.4 kg)   BMI 27.28 kg/m  Wt Readings from Last 3 Encounters:  08/29/24 179 lb  6.4 oz (81.4 kg)  06/19/24 185 lb 9.6 oz (84.2 kg)  04/26/24 195 lb (88.5 kg)    BP Readings from Last 3 Encounters:  08/29/24 98/64  06/19/24 108/67  04/26/24 98/60      Physical Exam- Limited  Constitutional:  Body mass index is 27.28 kg/m. , not in acute distress, normal state of mind Eyes:  EOMI, no exophthalmos Musculoskeletal: no gross deformities, strength intact in all four extremities, no gross restriction of joint movements Skin:  no rashes, no hyperemia Neurological: no tremor with outstretched hands   CMP ( most recent) CMP     Component Value Date/Time   NA 140 03/12/2022 0826   K 3.8 03/12/2022 0826   CL 111 03/12/2022 0826   CO2 24 03/12/2022 0826   GLUCOSE 107 (H) 03/12/2022 0826   BUN 15 03/12/2022 0826   CREATININE 0.71 03/12/2022 0826   CALCIUM 9.8 03/12/2022 0826   PROT 7.7 03/12/2022 0826   ALBUMIN 4.2 03/12/2022 0826   AST 11 (L) 03/12/2022 0826   ALT 9 03/12/2022 0826   ALKPHOS 71 03/12/2022 0826   BILITOT 0.3 03/12/2022 0826   GFRNONAA >60 03/12/2022 0826     Diabetic Labs (most recent): Lab Results  Component Value Date   HGBA1C 5.6 07/20/2020     Lipid Panel ( most recent) Lipid Panel     Component Value Date/Time   CHOL 193 07/20/2020 2320   TRIG 60 07/20/2020 2320   HDL 52 07/20/2020 2320   CHOLHDL 3.7 07/20/2020 2320   VLDL 12 07/20/2020 2320   LDLCALC 129 (H) 07/20/2020 2320       Lab Results  Component Value Date   TSH 0.189 (L) 08/17/2024   TSH 0.520 04/25/2024   TSH  0.35 (A) 11/03/2023   TSH 0.53 08/03/2023   TSH 0.582 07/20/2020   FREET4 1.00 08/17/2024   FREET4 0.92 04/25/2024     Latest Reference Range & Units 08/03/23 00:00 11/03/23 00:00 04/25/24 11:23 08/17/24 16:06  TSH 0.450 - 4.500 uIU/mL 0.53 (E) 0.35 ! (E) 0.520 0.189 (L)  T4,Free(Direct) 0.82 - 1.77 ng/dL   9.07 8.99  !: Data is abnormal (L): Data is abnormally low (E): External lab result  Assessment & Plan:   ASSESSMENT / PLAN:  1.  Hypothyroidism- r/t Hashimoto's thyroiditis  Patient with long-standing hypothyroidism, on levothyroxine  therapy.  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    Her previsit TFTs are consistent with appropriate hormone replacement.  She is advised to continue Levothyroxine  50 mcg po daily before breakfast.    - We discussed about correct intake of levothyroxine , at fasting, with water, separated by at least 30 minutes from breakfast, and separated by more than 4 hours from calcium, iron, multivitamins, acid reflux medications (PPIs). -Patient is made aware of the fact that thyroid hormone replacement is needed for life, dose to be adjusted by periodic monitoring of thyroid function tests.   -Due to absence of clinical goiter, no need for thyroid ultrasound.    I spent  16  minutes in the care of the patient today including review of labs from Thyroid Function, CMP, and other relevant labs ; imaging/biopsy records (current and previous including abstractions from other facilities); face-to-face time discussing  her lab results and symptoms, medications doses, her options of short and long term treatment based on the latest standards of care / guidelines;   and documenting the encounter.  Shyvonne Chastang Dillenbeck  participated in the discussions, expressed understanding, and voiced agreement with the above plans.  All questions were answered to her satisfaction. she is encouraged to contact clinic should she have any questions or concerns prior to her return visit.   FOLLOW UP PLAN:  Return in about 4 months (around 12/30/2024) for Thyroid follow up, Previsit labs.  Benton Rio, [facility] [address] [place], [place] [address] Phone: [phone] Fax: [phone]  08/29/2024, 9:49 AM

## 2024-09-11 ENCOUNTER — Ambulatory Visit (INDEPENDENT_AMBULATORY_CARE_PROVIDER_SITE_OTHER): Admitting: Psychiatry

## 2024-09-11 DIAGNOSIS — F329 Major depressive disorder, single episode, unspecified: Secondary | ICD-10-CM | POA: Diagnosis not present

## 2024-09-11 DIAGNOSIS — F330 Major depressive disorder, recurrent, mild: Secondary | ICD-10-CM

## 2024-09-11 DIAGNOSIS — F4310 Post-traumatic stress disorder, unspecified: Secondary | ICD-10-CM
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
## 2024-09-25 ENCOUNTER — Ambulatory Visit (INDEPENDENT_AMBULATORY_CARE_PROVIDER_SITE_OTHER): Admitting: Psychiatry

## 2024-09-25 DIAGNOSIS — F4310 Post-traumatic stress disorder, unspecified: Secondary | ICD-10-CM | POA: Diagnosis not present

## 2024-09-25 DIAGNOSIS — F330 Major depressive disorder, recurrent, mild: Secondary | ICD-10-CM | POA: Diagnosis not present
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
## 2024-10-12 ENCOUNTER — Ambulatory Visit (INDEPENDENT_AMBULATORY_CARE_PROVIDER_SITE_OTHER): Admitting: Psychiatry

## 2024-10-12 DIAGNOSIS — F330 Major depressive disorder, recurrent, mild: Secondary | ICD-10-CM | POA: Diagnosis not present

## 2024-10-12 DIAGNOSIS — F4310 Post-traumatic stress disorder, unspecified: Secondary | ICD-10-CM
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
## 2024-10-16 ENCOUNTER — Ambulatory Visit (HOSPITAL_COMMUNITY): Admitting: Psychiatry

## 2024-10-18 ENCOUNTER — Telehealth (INDEPENDENT_AMBULATORY_CARE_PROVIDER_SITE_OTHER): Admitting: Psychiatry

## 2024-10-18 ENCOUNTER — Encounter: Payer: Self-pay | Admitting: Psychiatry

## 2024-10-18 DIAGNOSIS — F330 Major depressive disorder, recurrent, mild: Secondary | ICD-10-CM | POA: Diagnosis not present

## 2024-10-18 DIAGNOSIS — F4310 Post-traumatic stress disorder, unspecified: Secondary | ICD-10-CM | POA: Diagnosis not present

## 2024-10-18 DIAGNOSIS — G4700 Insomnia, unspecified: Secondary | ICD-10-CM

## 2024-10-18 MED ORDER — SERTRALINE HCL 100 MG PO TABS: 200.0000 mg | ORAL_TABLET | Freq: Every day | ORAL | 1 refills | Status: AC

## 2024-10-18 MED ORDER — PRAZOSIN HCL 1 MG PO CAPS: 3.0000 mg | ORAL_CAPSULE | Freq: Every day | ORAL | 0 refills | Status: AC

## 2024-10-18 MED ORDER — TRAZODONE HCL 100 MG PO TABS: 50.0000 mg | ORAL_TABLET | Freq: Every day | ORAL | 0 refills | Status: AC

## 2024-10-18 MED ORDER — MIRTAZAPINE 30 MG PO TABS: 30.0000 mg | ORAL_TABLET | Freq: Every day | ORAL | 0 refills | Status: DC

## 2024-10-18 NOTE — Patient Instructions (Signed)
 Continue sertraline  200 mg daily  Continue mirtazapine  30 mg at night Continue hydroxyzine  25 mg daily as needed for anxiety  Continue prazosin  3 mg at night  Restart Trazodone  100 mg at night as needed for insomnia  Next appointment- 1/21 at 9 30

## 2024-11-10 ENCOUNTER — Ambulatory Visit (INDEPENDENT_AMBULATORY_CARE_PROVIDER_SITE_OTHER): Admitting: Psychiatry

## 2024-11-10 DIAGNOSIS — F4310 Post-traumatic stress disorder, unspecified: Secondary | ICD-10-CM

## 2024-11-10 DIAGNOSIS — F329 Major depressive disorder, single episode, unspecified: Secondary | ICD-10-CM | POA: Diagnosis not present

## 2024-11-10 DIAGNOSIS — F330 Major depressive disorder, recurrent, mild: Secondary | ICD-10-CM
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
## 2024-11-12 ENCOUNTER — Other Ambulatory Visit: Payer: Self-pay | Admitting: Psychiatry

## 2024-12-04 ENCOUNTER — Ambulatory Visit (INDEPENDENT_AMBULATORY_CARE_PROVIDER_SITE_OTHER): Admitting: Psychiatry

## 2024-12-04 DIAGNOSIS — F329 Major depressive disorder, single episode, unspecified: Secondary | ICD-10-CM

## 2024-12-04 DIAGNOSIS — F4310 Post-traumatic stress disorder, unspecified: Secondary | ICD-10-CM

## 2024-12-04 DIAGNOSIS — F330 Major depressive disorder, recurrent, mild: Secondary | ICD-10-CM
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
## 2024-12-13 ENCOUNTER — Telehealth: Admitting: Psychiatry

## 2024-12-13 ENCOUNTER — Encounter: Payer: Self-pay | Admitting: Psychiatry

## 2024-12-13 DIAGNOSIS — G4700 Insomnia, unspecified: Secondary | ICD-10-CM | POA: Diagnosis not present

## 2024-12-13 DIAGNOSIS — F4310 Post-traumatic stress disorder, unspecified: Secondary | ICD-10-CM

## 2024-12-13 DIAGNOSIS — F330 Major depressive disorder, recurrent, mild: Secondary | ICD-10-CM | POA: Diagnosis not present

## 2024-12-13 MED ORDER — MIRTAZAPINE 30 MG PO TABS: 30.0000 mg | ORAL_TABLET | Freq: Every day | ORAL | 0 refills | Status: AC

## 2024-12-13 NOTE — Patient Instructions (Signed)
 Continue sertraline  200 mg daily  Continue mirtazapine  30 mg at night Start Abilify 2 mg at night  Continue hydroxyzine  25 mg daily as needed for anxiety  Continue prazosin  3 mg at night  Restart Trazodone  100 mg at night as needed for insomnia  Next appointment- 2/25 at 11 am

## 2024-12-15 ENCOUNTER — Other Ambulatory Visit: Payer: Self-pay

## 2024-12-15 DIAGNOSIS — C50212 Malignant neoplasm of upper-inner quadrant of left female breast: Secondary | ICD-10-CM

## 2024-12-18 ENCOUNTER — Ambulatory Visit (HOSPITAL_COMMUNITY): Admitting: Psychiatry

## 2024-12-18 DIAGNOSIS — F330 Major depressive disorder, recurrent, mild: Secondary | ICD-10-CM

## 2024-12-18 DIAGNOSIS — F4310 Post-traumatic stress disorder, unspecified: Secondary | ICD-10-CM
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
## 2024-12-18 NOTE — Progress Notes (Signed)
 SABRA

## 2024-12-19 ENCOUNTER — Inpatient Hospital Stay

## 2024-12-19 ENCOUNTER — Inpatient Hospital Stay: Admitting: Hematology and Oncology

## 2024-12-26 ENCOUNTER — Other Ambulatory Visit: Payer: Self-pay

## 2024-12-26 DIAGNOSIS — C50212 Malignant neoplasm of upper-inner quadrant of left female breast: Secondary | ICD-10-CM

## 2024-12-26 LAB — TSH: TSH: 0.85 u[IU]/mL (ref 0.450–4.500)

## 2024-12-26 LAB — T4, FREE: Free T4: 0.99 ng/dL (ref 0.82–1.77)

## 2024-12-27 ENCOUNTER — Inpatient Hospital Stay

## 2024-12-27 ENCOUNTER — Inpatient Hospital Stay: Admitting: Hematology and Oncology

## 2024-12-27 VITALS — BP 122/64 | HR 87 | Temp 97.8°F | Resp 17 | Ht 68.0 in | Wt 167.0 lb

## 2024-12-27 DIAGNOSIS — C50212 Malignant neoplasm of upper-inner quadrant of left female breast: Secondary | ICD-10-CM

## 2024-12-27 DIAGNOSIS — Z170 Estrogen receptor positive status [ER+]: Secondary | ICD-10-CM | POA: Diagnosis not present

## 2024-12-27 LAB — CMP (CANCER CENTER ONLY)
ALT: <5 U/L (ref 0–44)
AST: 15 U/L (ref 15–41)
Albumin: 4.2 g/dL (ref 3.5–5.0)
Alkaline Phosphatase: 73 U/L (ref 38–126)
Anion gap: 10 (ref 5–15)
BUN: 13 mg/dL (ref 6–20)
CO2: 28 mmol/L (ref 22–32)
Calcium: 9.3 mg/dL (ref 8.9–10.3)
Chloride: 108 mmol/L (ref 98–111)
Creatinine: 0.78 mg/dL (ref 0.44–1.00)
GFR, Estimated: >60 mL/min
Glucose, Bld: 95 mg/dL (ref 70–99)
Potassium: 3.9 mmol/L (ref 3.5–5.1)
Sodium: 145 mmol/L (ref 135–145)
Total Bilirubin: 0.3 mg/dL (ref 0.0–1.2)
Total Protein: 7.6 g/dL (ref 6.5–8.1)

## 2024-12-27 LAB — CBC WITH DIFFERENTIAL (CANCER CENTER ONLY)
Abs Immature Granulocytes: 0.01 10*3/uL (ref 0.00–0.07)
Basophils Absolute: 0 10*3/uL (ref 0.0–0.1)
Basophils Relative: 1 %
Eosinophils Absolute: 0.1 10*3/uL (ref 0.0–0.5)
Eosinophils Relative: 2 %
HCT: 39.2 % (ref 36.0–46.0)
Hemoglobin: 13.1 g/dL (ref 12.0–15.0)
Immature Granulocytes: 0 %
Lymphocytes Relative: 28 %
Lymphs Abs: 1.4 10*3/uL (ref 0.7–4.0)
MCH: 30.6 pg (ref 26.0–34.0)
MCHC: 33.4 g/dL (ref 30.0–36.0)
MCV: 91.6 fL (ref 80.0–100.0)
Monocytes Absolute: 0.3 10*3/uL (ref 0.1–1.0)
Monocytes Relative: 6 %
Neutro Abs: 3.2 10*3/uL (ref 1.7–7.7)
Neutrophils Relative %: 63 %
Platelet Count: 267 10*3/uL (ref 150–400)
RBC: 4.28 MIL/uL (ref 3.87–5.11)
RDW: 13.4 % (ref 11.5–15.5)
WBC Count: 5 10*3/uL (ref 4.0–10.5)
nRBC: 0 % (ref 0.0–0.2)

## 2024-12-27 NOTE — Progress Notes (Signed)
 [facility] Cancer Follow up:    Maria Guy, DO [address] [place] [address]   DIAGNOSIS:  Cancer Staging  Malignant neoplasm of upper-inner quadrant of left breast in female, estrogen receptor positive (HCC) Staging form: Breast, AJCC 8th Edition - Clinical: Stage IIB (cT2, cN0, cM0, G3, ER+, PR-, HER2-) - Signed by Loretha Ash, MD on 12/21/2023 Stage prefix: Initial diagnosis Histologic grading system: 3 grade system   SUMMARY OF ONCOLOGIC HISTORY: Oncology History  Malignant neoplasm of upper-inner quadrant of left breast in female, estrogen receptor positive (HCC)  12/26/2021 Genetic Testing   Negative.  The Common Hereditary Gene Panel offered by [facility] includes sequencing and/or deletion duplication testing of the following 47 genes: APC, ATM, AXIN2, BARD1, BMPR1A, BRCA1, BRCA2, BRIP1, CDH1, CDK4, CDKN2A (p14ARF), CDKN2A (p16INK4a), CHEK2, CTNNA1, DICER1, EPCAM (Deletion/duplication testing only), GREM1 (promoter region deletion/duplication testing only), KIT, MEN1, MLH1, MSH2, MSH3, MSH6, MUTYH, NBN, NF1, NHTL1, PALB2, PDGFRA, PMS2, POLD1, POLE, PTEN, RAD50, RAD51C, RAD51D, SDHB, SDHC, SDHD, SMAD4, SMARCA4. STK11, TP53, TSC1, TSC2, and VHL.  The following genes were evaluated for sequence changes only: SDHA and HOXB13 c.251G>A variant only.    12/31/2021 Initial Diagnosis   Malignant neoplasm of upper-inner quadrant of left breast in female, estrogen receptor positive (HCC)   12/31/2021 Cancer Staging   Staging form: Breast, AJCC 8th Edition - Clinical: Stage IIB (cT2, cN0, cM0, G3, ER+, PR-, HER2-) - Signed by Kysen Wetherington, MD on 12/21/2023 Stage prefix: Initial diagnosis Histologic grading system: 3 grade system   01/08/2022 -  Chemotherapy   Completed 4 cycles of neoadjuvant TC, last cycle received on March 12, 2022     04/21/2022 Definitive Surgery   She had left breast lumpectomy with no residual carcinoma status post neoadjuvant therapy, treatment  related changes, no carcinoma identified in lymph nodes.  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  She continues tamoxifen  therapy without new adverse effects. She denies new breast lumps, changes, abnormal bleeding, thromboembolic symptoms, or other concerning breast symptoms.  Her last bilateral mammogram was in January 2025, with a unilateral left breast mammogram in April 2025. A recent mammogram appointment was canceled due to inclement weather; she is aware her next surveillance mammogram is due and will be rescheduled. She previously completed a Guardant Reveal blood test for surveillance, which was not covered by insurance; she has not received a bill and understands the process. She is not due for repeat blood testing until the end of February.  She has achieved a 50-pound weight loss with Wegovy, reducing her weight from 220 to 170 pounds. She is not currently engaging in regular exercise or weight training and expresses concern about potential weight regain after discontinuing Wegovy.  She is postmenopausal without  menses or new menopausal symptoms. She reports regular bowel movements without hematochezia or melena, and  no respiratory complaints.  Jun 19, 2024: Follow-up for ongoing tamoxifen  therapy and evaluation of palpable left breast mass; recent mammogram and ultrasound showed no concerning findings, mass likely scar tissue or dense tissue. Patient experiencing hot flashes and sleep disturbances attributed to perimenopause, remains physically active. Plan to continue tamoxifen , monitor for thromboembolism and endometrial cancer symptoms, schedule mammogram for April 2026, and perform liver function test in six months.    Patient Active Problem List   Diagnosis Date Noted   Anemia 12/31/2021   Malignant neoplasm of upper-inner quadrant of left breast in female, estrogen receptor positive (HCC) 12/31/2021   Open angle with borderline findings and low glaucoma risk in both eyes 01/11/2016   Chronic idiopathic urticaria 01/11/2016   Age-related nuclear cataract of both eyes 01/11/2016   Eczema 01/11/2016    is allergic to haemophilus influenzae vaccines and penicillins.  MEDICAL HISTORY: Past Medical History:  Diagnosis Date   Anemia    Anxiety    Depression    Glaucoma    History of radiation therapy    Left breast 05/28/22-07/06/22-Dr. Lynwood Nasuti   Hives    chronic, on Xolair injections    SURGICAL HISTORY: Past Surgical History:  Procedure Laterality Date   BREAST BIOPSY Left 01/19/2022   BREAST CYST EXCISION Left 01/28/2021   Procedure: LEFT BREAST MASS EXCISION;  Surgeon: Ebbie Cough, MD;  Location: [facility];  Service: General;  Laterality: Left;  START TIME OF 3:00 PM FOR 60 MINUTES IN ROOM 8   BREAST LUMPECTOMY WITH RADIOACTIVE SEED AND SENTINEL LYMPH NODE BIOPSY Left 04/13/2022   Procedure: LEFT BREAST BRACKETED LUMPECTOMY WITH RADIOACTIVE SEED AND AXILLARY SENTINEL LYMPH NODE BIOPSY;  Surgeon: Ebbie Cough, MD;  Location: [facility];  Service: General;  Laterality: Left;   HYSTEROSCOPY W/ ENDOMETRIAL ABLATION  08/14/2020   [facility]    MASTOPEXY Bilateral 04/21/2022   Procedure: MASTOPEXY;  Surgeon: Arelia Filippo, MD;  Location: [facility];  Service: Plastics;  Laterality: Bilateral;    SOCIAL HISTORY: Social History   Socioeconomic History   Marital status: Divorced    Spouse name: Not on file   Number of children: Not on file   Years of education: Not on file   Highest education level: Not on file  Occupational History   Not on file  Tobacco Use   Smoking status: Never   Smokeless tobacco: Never  Substance and Sexual Activity   Alcohol use: Yes    Alcohol/week: 1.0 standard drink of alcohol    Types: 1 Glasses of wine per week    Comment: holidays   Drug use: Never   Sexual activity: Yes    Birth control/protection: None    Comment: ablation  Other Topics Concern   Not on file  Social History Narrative   Not on file   Social Drivers of Health   Tobacco Use: Low Risk (12/13/2024)   Patient History    Smoking Tobacco Use: Never    Smokeless Tobacco Use: Never    Passive Exposure: Not on file  Financial Resource Strain: Low Risk (08/16/2023)   Received from [facility]   Overall Financial Resource Strain (CARDIA)    Difficulty of Paying Living Expenses: Not hard at all  Food Insecurity: No Food Insecurity (12/27/2024)   Epic    Worried About Running Out of Food in the Last Year: Never true  Ran Out of Food in the Last Year: Never true  Transportation Needs: No Transportation Needs (12/27/2024)   Epic    Lack of Transportation (Medical): No    Lack of Transportation (Non-Medical): No  Physical Activity: Insufficiently Active (11/02/2023)   Received from [facility]   Exercise Vital Sign    On average, how many days per week do you engage in moderate to strenuous exercise (like a brisk walk)?: 2 days    On average, how many minutes do you engage in exercise at this level?: 30 min  Stress: No Stress Concern Present (11/02/2023)   Received from [facility] - Occupational Stress Questionnaire    Feeling of Stress : Only a little  Social Connections: Socially Integrated (07/30/2022)   Received from [facility]   Social Connection and Isolation Panel    In a typical week, how many times do you talk on the phone with family, friends, or neighbors?: Twice a week    How often do you get together with friends or relatives?: Twice a week    How often do you attend church or religious services?: 1 to 4 times per year    Do you belong to any clubs or organizations such as church groups, unions, fraternal or athletic groups, or school groups?: No    How often do you attend meetings of the clubs or organizations you belong to?: 1 to 4 times per year    Are you married, widowed, divorced, separated, never married, or living with a partner?: Married  Intimate Partner Violence: Not At Risk (12/27/2024)   Epic    Fear of Current or Ex-Partner: No    Emotionally Abused: No    Physically Abused: No    Sexually Abused: No  Depression (PHQ2-9): Low Risk (12/27/2024)   Depression (PHQ2-9)    PHQ-2 Score: 4  Alcohol Screen: Not on file  Housing: Low Risk (12/27/2024)   Epic    Unable to Pay for Housing in the Last Year: No    Number of Times Moved in the Last Year: 0    Homeless in the Last Year: No  Utilities: Not At Risk (12/27/2024)   Epic    Threatened with loss of utilities: No  Health Literacy: Low Risk (07/30/2022)   Received from [facility]    How often do you need to have someone help you when you read instructions, pamphlets, or other written material from your doctor or pharmacy?: Never    FAMILY HISTORY: Family History  Problem Relation Age of Onset   Anxiety disorder Sister    Drug abuse Mother     Review of Systems  Constitutional:  Negative for appetite change, chills, fatigue, fever and unexpected weight change.  HENT:   Negative for hearing loss, lump/mass and trouble swallowing.    Eyes:  Negative for eye problems and icterus.  Respiratory:  Negative for chest tightness, cough and shortness of breath.   Cardiovascular:  Negative for chest pain, leg swelling and palpitations.  Gastrointestinal:  Negative for abdominal distention, abdominal pain, constipation, diarrhea, nausea and vomiting.  Endocrine: Negative for hot flashes.  Genitourinary:  Negative for difficulty urinating.   Musculoskeletal:  Negative for arthralgias.  Skin:  Negative for itching and rash.  Neurological:  Negative for dizziness, extremity weakness, headaches and numbness.  Hematological:  Negative for adenopathy. Does not bruise/bleed easily.  Psychiatric/Behavioral:  Negative for depression.  The patient is not nervous/anxious.       PHYSICAL EXAMINATION   Onc Performance Status - 12/27/24 1259       ECOG Perf Status   ECOG Perf Status Fully active, able to carry on all pre-disease performance without restriction      KPS SCALE   KPS % SCORE Able to carry on normal activity, minor s/s of disease            Vitals:   12/27/24 1258  BP: 122/64  Pulse: 87  Resp: 17  Temp: 97.8 F (36.6 C)  SpO2: 98%      Physical Exam Constitutional:      General: She is not in acute distress.    Appearance: Normal appearance. She is not toxic-appearing.  HENT:     Head: Normocephalic and atraumatic.  Eyes:     General: No scleral icterus. Chest:       Comments: Bilateral breasts inspected. No palpable masses. No regional adenopathy Musculoskeletal:        General: No swelling.  Lymphadenopathy:     Upper Body:     Right upper body: No supraclavicular or axillary adenopathy.     Left upper body: No supraclavicular or axillary adenopathy.  Skin:    General: Skin is warm and dry.     Findings: No rash.  Neurological:     General: No focal deficit present.     Mental Status: She is alert.  Psychiatric:        Mood and Affect: Mood normal.        Behavior: Behavior normal.      ASSESSMENT and THERAPY PLAN:   Assessment & Plan Estrogen receptor positive malignant neoplasm of upper-inner quadrant of left breast - Tolerating tamoxifen  well, no adverse effects - No concern for recurrence on exam.  - Mammogram scheduled for April 2026; She is due now. - Guardant Reveal blood test for surveillance due end of February; contact company if not reached. - Routine follow-up in six months.  Obesity Managed with Tzhncb, resulting in significant weight loss. Concern about weight regain post-medication. Not participating in regular exercise or resistance training. - Discussed importance of incorporating resistance training to maintain muscle mass and minimize skin laxity post-weight loss. - Encouraged development of healthy habits to support weight maintenance after cessation of pharmacotherapy. - Emphasized healthy lifestyle as integral to long-term survivorship care.  All questions were answered. The patient knows to call the clinic with any problems, questions or concerns. We can certainly see the patient much sooner if necessary.  Total encounter time:30 minutes*in face-to-face visit time, chart review, lab review, care coordination, order entry, and documentation of the encounter time.    *Total Encounter Time as defined by the Centers for Medicare and Medicaid Services includes, in addition to the face-to-face time of a patient visit (documented in the note above) non-face-to-face time: obtaining and reviewing outside history, ordering and reviewing medications, tests or procedures, care coordination (communications with other health care professionals or caregivers) and documentation in the medical record.

## 2025-01-01 ENCOUNTER — Ambulatory Visit: Admitting: Nurse Practitioner

## 2025-01-01 DIAGNOSIS — E063 Autoimmune thyroiditis: Secondary | ICD-10-CM

## 2025-01-05 ENCOUNTER — Ambulatory Visit (HOSPITAL_COMMUNITY): Admitting: Psychiatry

## 2025-01-17 ENCOUNTER — Telehealth: Admitting: Psychiatry

## 2025-01-19 ENCOUNTER — Ambulatory Visit (HOSPITAL_COMMUNITY): Admitting: Psychiatry

## 2025-02-02 ENCOUNTER — Ambulatory Visit (HOSPITAL_COMMUNITY): Admitting: Psychiatry

## 2025-02-16 ENCOUNTER — Ambulatory Visit (HOSPITAL_COMMUNITY): Admitting: Psychiatry

## 2025-06-26 ENCOUNTER — Inpatient Hospital Stay: Admitting: Hematology and Oncology

## 2025-06-26 ENCOUNTER — Inpatient Hospital Stay
